# Patient Record
Sex: Female | Born: 1985
Health system: Southern US, Community
[De-identification: ages and names within clinical notes are randomized; demographics above are authoritative.]

## PROBLEM LIST (undated history)

## (undated) DIAGNOSIS — N3281 Overactive bladder: Secondary | ICD-10-CM

## (undated) DIAGNOSIS — K5900 Constipation, unspecified: Secondary | ICD-10-CM

## (undated) DIAGNOSIS — K829 Disease of gallbladder, unspecified: Secondary | ICD-10-CM

## (undated) DIAGNOSIS — F329 Major depressive disorder, single episode, unspecified: Secondary | ICD-10-CM

## (undated) DIAGNOSIS — G43909 Migraine, unspecified, not intractable, without status migrainosus: Secondary | ICD-10-CM

## (undated) DIAGNOSIS — R102 Pelvic and perineal pain: Secondary | ICD-10-CM

## (undated) DIAGNOSIS — E739 Lactose intolerance, unspecified: Secondary | ICD-10-CM

## (undated) DIAGNOSIS — R32 Unspecified urinary incontinence: Secondary | ICD-10-CM

## (undated) DIAGNOSIS — F419 Anxiety disorder, unspecified: Secondary | ICD-10-CM

## (undated) DIAGNOSIS — K219 Gastro-esophageal reflux disease without esophagitis: Secondary | ICD-10-CM

## (undated) DIAGNOSIS — T7840XA Allergy, unspecified, initial encounter: Secondary | ICD-10-CM

## (undated) DIAGNOSIS — K589 Irritable bowel syndrome without diarrhea: Secondary | ICD-10-CM

## (undated) DIAGNOSIS — E282 Polycystic ovarian syndrome: Secondary | ICD-10-CM

## (undated) DIAGNOSIS — R002 Palpitations: Secondary | ICD-10-CM

## (undated) DIAGNOSIS — M549 Dorsalgia, unspecified: Secondary | ICD-10-CM

## (undated) DIAGNOSIS — R0600 Dyspnea, unspecified: Secondary | ICD-10-CM

## (undated) DIAGNOSIS — N3010 Interstitial cystitis (chronic) without hematuria: Secondary | ICD-10-CM

## (undated) DIAGNOSIS — F32A Depression, unspecified: Secondary | ICD-10-CM

## (undated) DIAGNOSIS — G932 Benign intracranial hypertension: Secondary | ICD-10-CM

## (undated) DIAGNOSIS — N393 Stress incontinence (female) (male): Secondary | ICD-10-CM

## (undated) HISTORY — DX: Benign intracranial hypertension: G93.2

## (undated) HISTORY — DX: Polycystic ovarian syndrome: E28.2

## (undated) HISTORY — DX: Unspecified urinary incontinence: R32

## (undated) HISTORY — DX: Interstitial cystitis (chronic) without hematuria: N30.10

## (undated) HISTORY — DX: Dyspnea, unspecified: R06.00

## (undated) HISTORY — DX: Disease of gallbladder, unspecified: K82.9

## (undated) HISTORY — DX: Palpitations: R00.2

## (undated) HISTORY — DX: Allergy, unspecified, initial encounter: T78.40XA

## (undated) HISTORY — DX: Anxiety disorder, unspecified: F41.9

## (undated) HISTORY — DX: Lactose intolerance, unspecified: E73.9

## (undated) HISTORY — DX: Irritable bowel syndrome, unspecified: K58.9

## (undated) HISTORY — DX: Dorsalgia, unspecified: M54.9

## (undated) HISTORY — PX: TUBAL LIGATION: SHX77

## (undated) HISTORY — DX: Constipation, unspecified: K59.00

## (undated) HISTORY — DX: Migraine, unspecified, not intractable, without status migrainosus: G43.909

---

## 2006-07-14 ENCOUNTER — Emergency Department (HOSPITAL_COMMUNITY): Admission: EM | Admit: 2006-07-14 | Discharge: 2006-07-14 | Payer: Self-pay | Admitting: Emergency Medicine

## 2006-07-29 ENCOUNTER — Emergency Department (HOSPITAL_COMMUNITY): Admission: EM | Admit: 2006-07-29 | Discharge: 2006-07-29 | Payer: Self-pay | Admitting: Family Medicine

## 2007-04-26 ENCOUNTER — Inpatient Hospital Stay (HOSPITAL_COMMUNITY): Admission: AD | Admit: 2007-04-26 | Discharge: 2007-04-26 | Payer: Self-pay | Admitting: Obstetrics and Gynecology

## 2007-09-01 ENCOUNTER — Inpatient Hospital Stay (HOSPITAL_COMMUNITY): Admission: RE | Admit: 2007-09-01 | Discharge: 2007-09-04 | Payer: Self-pay | Admitting: Obstetrics and Gynecology

## 2007-09-09 ENCOUNTER — Inpatient Hospital Stay (HOSPITAL_COMMUNITY): Admission: AD | Admit: 2007-09-09 | Discharge: 2007-09-09 | Payer: Self-pay | Admitting: Obstetrics and Gynecology

## 2007-09-11 ENCOUNTER — Inpatient Hospital Stay (HOSPITAL_COMMUNITY): Admission: AD | Admit: 2007-09-11 | Discharge: 2007-09-11 | Payer: Self-pay | Admitting: Obstetrics and Gynecology

## 2007-11-01 ENCOUNTER — Encounter: Admission: RE | Admit: 2007-11-01 | Discharge: 2007-11-01 | Payer: Self-pay | Admitting: Neurology

## 2008-01-25 ENCOUNTER — Ambulatory Visit (HOSPITAL_COMMUNITY): Admission: RE | Admit: 2008-01-25 | Discharge: 2008-01-25 | Payer: Self-pay | Admitting: Family Medicine

## 2008-01-27 HISTORY — PX: CHOLECYSTECTOMY: SHX55

## 2008-02-12 ENCOUNTER — Emergency Department (HOSPITAL_COMMUNITY): Admission: EM | Admit: 2008-02-12 | Discharge: 2008-02-12 | Payer: Self-pay | Admitting: Family Medicine

## 2008-03-07 ENCOUNTER — Encounter (INDEPENDENT_AMBULATORY_CARE_PROVIDER_SITE_OTHER): Payer: Self-pay | Admitting: General Surgery

## 2008-03-07 ENCOUNTER — Ambulatory Visit (HOSPITAL_COMMUNITY): Admission: RE | Admit: 2008-03-07 | Discharge: 2008-03-07 | Payer: Self-pay | Admitting: General Surgery

## 2008-03-18 ENCOUNTER — Emergency Department (HOSPITAL_COMMUNITY): Admission: EM | Admit: 2008-03-18 | Discharge: 2008-03-18 | Payer: Self-pay | Admitting: Emergency Medicine

## 2008-08-13 ENCOUNTER — Ambulatory Visit (HOSPITAL_COMMUNITY): Admission: RE | Admit: 2008-08-13 | Discharge: 2008-08-13 | Payer: Self-pay | Admitting: Family Medicine

## 2008-08-24 ENCOUNTER — Ambulatory Visit (HOSPITAL_COMMUNITY): Admission: RE | Admit: 2008-08-24 | Discharge: 2008-08-24 | Payer: Self-pay | Admitting: Family Medicine

## 2008-09-11 ENCOUNTER — Encounter: Admission: RE | Admit: 2008-09-11 | Discharge: 2008-10-10 | Payer: Self-pay | Admitting: Family Medicine

## 2008-12-02 ENCOUNTER — Emergency Department (HOSPITAL_COMMUNITY): Admission: EM | Admit: 2008-12-02 | Discharge: 2008-12-02 | Payer: Self-pay | Admitting: Family Medicine

## 2009-01-04 ENCOUNTER — Emergency Department (HOSPITAL_COMMUNITY): Admission: EM | Admit: 2009-01-04 | Discharge: 2009-01-04 | Payer: Self-pay | Admitting: Family Medicine

## 2009-03-17 ENCOUNTER — Emergency Department (HOSPITAL_COMMUNITY): Admission: EM | Admit: 2009-03-17 | Discharge: 2009-03-17 | Payer: Self-pay | Admitting: Emergency Medicine

## 2009-05-25 ENCOUNTER — Emergency Department (HOSPITAL_COMMUNITY): Admission: EM | Admit: 2009-05-25 | Discharge: 2009-05-26 | Payer: Self-pay | Admitting: Emergency Medicine

## 2009-06-10 DIAGNOSIS — R55 Syncope and collapse: Secondary | ICD-10-CM | POA: Insufficient documentation

## 2009-06-11 DIAGNOSIS — R0989 Other specified symptoms and signs involving the circulatory and respiratory systems: Secondary | ICD-10-CM | POA: Insufficient documentation

## 2009-06-11 DIAGNOSIS — R0609 Other forms of dyspnea: Secondary | ICD-10-CM | POA: Insufficient documentation

## 2009-06-11 DIAGNOSIS — R002 Palpitations: Secondary | ICD-10-CM | POA: Insufficient documentation

## 2009-06-13 ENCOUNTER — Ambulatory Visit: Payer: Self-pay | Admitting: Cardiology

## 2009-06-13 DIAGNOSIS — I517 Cardiomegaly: Secondary | ICD-10-CM | POA: Insufficient documentation

## 2009-06-26 HISTORY — PX: TRANSTHORACIC ECHOCARDIOGRAM: SHX275

## 2009-07-03 ENCOUNTER — Ambulatory Visit: Payer: Self-pay

## 2009-07-03 ENCOUNTER — Ambulatory Visit: Payer: Self-pay | Admitting: Cardiovascular Disease

## 2009-07-03 ENCOUNTER — Encounter: Payer: Self-pay | Admitting: Cardiology

## 2009-07-03 ENCOUNTER — Ambulatory Visit (HOSPITAL_COMMUNITY): Admission: RE | Admit: 2009-07-03 | Discharge: 2009-07-03 | Payer: Self-pay | Admitting: Cardiology

## 2009-07-10 ENCOUNTER — Encounter: Admission: RE | Admit: 2009-07-10 | Discharge: 2009-07-10 | Payer: Self-pay | Admitting: Obstetrics and Gynecology

## 2009-08-18 ENCOUNTER — Ambulatory Visit: Payer: Self-pay | Admitting: Nurse Practitioner

## 2009-08-18 ENCOUNTER — Inpatient Hospital Stay (HOSPITAL_COMMUNITY): Admission: AD | Admit: 2009-08-18 | Discharge: 2009-08-18 | Payer: Self-pay | Admitting: Obstetrics and Gynecology

## 2009-08-18 DIAGNOSIS — R32 Unspecified urinary incontinence: Secondary | ICD-10-CM

## 2009-08-18 DIAGNOSIS — O9989 Other specified diseases and conditions complicating pregnancy, childbirth and the puerperium: Secondary | ICD-10-CM

## 2009-08-18 DIAGNOSIS — O99891 Other specified diseases and conditions complicating pregnancy: Secondary | ICD-10-CM

## 2009-09-13 ENCOUNTER — Encounter (INDEPENDENT_AMBULATORY_CARE_PROVIDER_SITE_OTHER): Payer: Self-pay | Admitting: Obstetrics and Gynecology

## 2009-09-13 ENCOUNTER — Inpatient Hospital Stay (HOSPITAL_COMMUNITY): Admission: AD | Admit: 2009-09-13 | Discharge: 2009-09-15 | Payer: Self-pay | Admitting: Obstetrics and Gynecology

## 2010-02-26 NOTE — Assessment & Plan Note (Signed)
Summary: np6/palps/sob/[redacted] weeks pregnant   Visit Type:  new pt visit Referring Provider:  Dr. Cherly Hensen Primary Provider:  Elvera Lennox PA [facility]  CC:  pt has had 2 syncope episodes, 1st episode pt states she was dehydrated, and no reason found for the 2nd episode....sob at times...denies any cp or edema.  History of Present Illness: [name] is referred today by Dr. Cherly Hensen for evaluation of 2 episodes of syncope.  She is currently [redacted] weeks pregnant. She's had 2 episodes of syncope at work. 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  Once, she had been sick and had had poor p.o. intake. She was not febrile. She was standing taking report and became lightheaded, vision blackened, she went down. She came to quickly and was clear mentally. No seizure activity was noted.  A second occasion occurred on 30 April. She was standing once again and became lightheaded short of breath, vision blocking, and she went out.  She was evaluated in the emergency room which I reviewed today. A chest x-ray showed a question of cardiomegaly a calm him a CT angios showed no pulmonary embolus with the heart being upper limits of normal in size. No other abnormality.  She's had no syncope with exertion. She denies any chest pain or significant dyspnea on exertion. She has had no orthopnea or PND. She denies significant edema.  She denies problems with her first pregnancy.  Current Medications (verified): 1)  Prenatal Vitamins 0.8 Mg Tabs (Prenatal Multivit-Min-Fe-Fa) .Marland Kitchen.. 1 Tab Once Daily 2)  Zyrtec Allergy 10 Mg Tabs (Cetirizine Hcl) .Marland Kitchen.. 1 Tab Once Daily  Allergies (verified): No Known Drug Allergies  Review of Systems       negative history of present illness  Vital Signs:  Patient profile:   25 year old female Height:      65 inches Weight:      268 pounds BMI:     44.76 Pulse rate:   95 / minute Pulse (ortho):   102 / minute Pulse rhythm:   regular BP sitting:   99 / 65  (left arm) BP standing:   92  / 65 Cuff size:   large  Vitals Entered By: Danielle Rankin, CMA (Jun 13, 2009 11:39 AM)  Serial Vital Signs/Assessments:  Time      Position  BP       Pulse  Resp  Temp     By 11:42 AM  Lying LA  99/65    102                   Danielle Rankin, CMA 11:43 AM  Sitting   98/68    108                   Danielle Rankin, CMA 11:44 AM  Standing  92/65    102                   Danielle Rankin, [place] 11:45 AM  Standing  91/69    117                   Danielle Rankin, CMA 11:47 AM  Standing  95/69    107                   Danielle Rankin, CMA  Comments: 11:42 AM no sxms By: Danielle Rankin, CMA  11:43 AM no sxms By: Danielle Rankin, CMA  11:44 AM no sxms By: Danielle Rankin, CMA  11:45 AM no sxms By: Danielle Rankin, CMA  11:47 AM no sxms By: Danielle Rankin, CMA    Physical Exam  General:  obese.  obese.   Head:  normocephalic and atraumatic Eyes:  PERRLA/EOM intact; conjunctiva and lids normal. Ears:  TM's intact and clear with normal canals and hearing Neck:  Neck supple, no JVD. No masses, thyromegaly or abnormal cervical nodes. Chest Tiron Suski:  no deformities or breast masses noted Lungs:  Clear bilaterally to auscultation and percussion. Heart:  PMI difficult to appreciate, regular rate and rhythm, normal S1-S2, no gallop or murmur. Abdomen:  pregnant, obese Msk:  Back normal, normal gait. Muscle strength and tone normal. Pulses:  pulses normal in all 4 extremities Extremities:  No clubbing or cyanosis. Neurologic:  Alert and oriented x 3. Skin:  Intact without lesions or rashes. Psych:  Normal affect.   EKG  Procedure date:  06/13/2009  Findings:      nsinus rhythm, normal EKG  EKG  Procedure date:  06/13/2009  Findings:      normal sinus rhythm, normal EKG  Impression & Recommendations:  Problem # 1:  SYNCOPE (ICD-780.2) Assessment New  Both episodes of syncope sound orthostatic. One was with dehydration and poor p.o. intake, otherwise was when she was standing too long taking report. She is  clearly orthostatic in the office. If anything her blood pressures borderline low even at rest.  I reviewed orthostatic precautions, aspirate liver last her sodium and her diet, and avoid standing in one position too long. We'll check a 2-D echocardiogram to rule out structural heart disease.  Orders: Echocardiogram (Echo)  Problem # 2:  CARDIOMEGALY (ICD-429.3) Assessment: New Check echocardiogram to rule out cardiac enlargement or cardiomyopathy. We'll also assess right-sided pressures.  Other Orders: EKG w/ Interpretation (93000)  Patient Instructions: 1)  Your physician recommends that you schedule a follow-up appointment in: AS NEEDED 2)  Your physician recommends that you continue on your current medications as directed. Please refer to the Current Medication list given to you today. 3)  Your physician has requested that you have an echocardiogram.  Echocardiography is a painless test that uses sound waves to create images of your heart. It provides your doctor with information about the size and shape of your heart and how well your heart's chambers and valves are working.  This procedure takes approximately one hour. There are no restrictions for this procedure. 4)  INCREASE SODIUM IN DIET 5)  ORTHOSTATIC PRECAUTIONS  6)  NO STANDING IN 1 POSITION FOR LONG PERIOD 7)  AND MAKE SLOW MOVEMENTS IN POSITIONAL CHANGES

## 2010-04-11 LAB — CBC
HCT: 33.7 % — ABNORMAL LOW (ref 36.0–46.0)
HCT: 36.9 % (ref 36.0–46.0)
Hemoglobin: 11.5 g/dL — ABNORMAL LOW (ref 12.0–15.0)
Hemoglobin: 12.4 g/dL (ref 12.0–15.0)
MCH: 33.3 pg (ref 26.0–34.0)
MCH: 33.8 pg (ref 26.0–34.0)
MCHC: 33.7 g/dL (ref 30.0–36.0)
MCHC: 34.2 g/dL (ref 30.0–36.0)
MCV: 98.8 fL (ref 78.0–100.0)
MCV: 99 fL (ref 78.0–100.0)
Platelets: 142 10*3/uL — ABNORMAL LOW (ref 150–400)
Platelets: 190 10*3/uL (ref 150–400)
RBC: 3.41 MIL/uL — ABNORMAL LOW (ref 3.87–5.11)
RBC: 3.73 MIL/uL — ABNORMAL LOW (ref 3.87–5.11)
RDW: 13.9 % (ref 11.5–15.5)
RDW: 14.1 % (ref 11.5–15.5)
WBC: 8.5 10*3/uL (ref 4.0–10.5)
WBC: 8.9 10*3/uL (ref 4.0–10.5)

## 2010-04-11 LAB — BASIC METABOLIC PANEL
BUN: 4 mg/dL — ABNORMAL LOW (ref 6–23)
CO2: 25 mEq/L (ref 19–32)
Calcium: 9.5 mg/dL (ref 8.4–10.5)
Chloride: 106 mEq/L (ref 96–112)
Creatinine, Ser: 0.53 mg/dL (ref 0.4–1.2)
GFR calc Af Amer: >60 mL/min (ref >60)
GFR calc non Af Amer: >60 mL/min (ref >60)
Glucose, Bld: 82 mg/dL (ref 70–99)
Potassium: 3.3 mEq/L — ABNORMAL LOW (ref 3.5–5.1)
Sodium: 136 mEq/L (ref 135–145)

## 2010-04-11 LAB — GLUCOSE, CAPILLARY
Glucose-Capillary: 98 mg/dL (ref 70–99)
Glucose-Capillary: 99 mg/dL (ref 70–99)

## 2010-04-11 LAB — SURGICAL PCR SCREEN
MRSA, PCR: NEGATIVE
Staphylococcus aureus: POSITIVE — AB

## 2010-04-11 LAB — RPR: RPR Ser Ql: NONREACTIVE

## 2010-04-11 LAB — CCBB MATERNAL DONOR DRAW

## 2010-04-15 LAB — POCT I-STAT, CHEM 8
BUN: 6 mg/dL (ref 6–23)
Calcium, Ion: 1.13 mmol/L (ref 1.12–1.32)
Chloride: 107 mEq/L (ref 96–112)
Creatinine, Ser: 0.5 mg/dL (ref 0.4–1.2)
Glucose, Bld: 141 mg/dL — ABNORMAL HIGH (ref 70–99)
HCT: 37 % (ref 36.0–46.0)
Hemoglobin: 12.6 g/dL (ref 12.0–15.0)
Potassium: 3.2 mEq/L — ABNORMAL LOW (ref 3.5–5.1)
Sodium: 139 mEq/L (ref 135–145)
TCO2: 19 mmol/L (ref 0–100)

## 2010-04-16 LAB — URINALYSIS, ROUTINE W REFLEX MICROSCOPIC
Bilirubin Urine: NEGATIVE
Glucose, UA: NEGATIVE mg/dL
Ketones, ur: NEGATIVE mg/dL
Leukocytes, UA: NEGATIVE
Nitrite: NEGATIVE
Protein, ur: NEGATIVE mg/dL
Specific Gravity, Urine: 1.004 — ABNORMAL LOW (ref 1.005–1.030)
Urobilinogen, UA: 0.2 mg/dL (ref 0.0–1.0)
pH: 6 (ref 5.0–8.0)

## 2010-04-16 LAB — POCT I-STAT, CHEM 8
BUN: 3 mg/dL — ABNORMAL LOW (ref 6–23)
Calcium, Ion: 1.09 mmol/L — ABNORMAL LOW (ref 1.12–1.32)
Chloride: 107 mEq/L (ref 96–112)
Creatinine, Ser: 0.6 mg/dL (ref 0.4–1.2)
Glucose, Bld: 97 mg/dL (ref 70–99)
HCT: 39 % (ref 36.0–46.0)
Hemoglobin: 13.3 g/dL (ref 12.0–15.0)
Potassium: 3.2 mEq/L — ABNORMAL LOW (ref 3.5–5.1)
Sodium: 139 mEq/L (ref 135–145)
TCO2: 19 mmol/L (ref 0–100)

## 2010-04-16 LAB — URINE MICROSCOPIC-ADD ON

## 2010-04-29 LAB — POCT URINALYSIS DIP (DEVICE)
Bilirubin Urine: NEGATIVE
Glucose, UA: NEGATIVE mg/dL
Ketones, ur: NEGATIVE mg/dL
Nitrite: NEGATIVE
Protein, ur: 30 mg/dL — AB
Specific Gravity, Urine: 1.02 (ref 1.005–1.030)
Urobilinogen, UA: 0.2 mg/dL (ref 0.0–1.0)
pH: 6 (ref 5.0–8.0)

## 2010-04-29 LAB — POCT PREGNANCY, URINE: Preg Test, Ur: NEGATIVE

## 2010-04-30 LAB — POCT PREGNANCY, URINE: Preg Test, Ur: NEGATIVE

## 2010-05-12 LAB — POCT URINALYSIS DIP (DEVICE)
Bilirubin Urine: NEGATIVE
Glucose, UA: NEGATIVE mg/dL
Ketones, ur: NEGATIVE mg/dL
Nitrite: NEGATIVE
Protein, ur: 30 mg/dL — AB
Specific Gravity, Urine: 1.025 (ref 1.005–1.030)
Urobilinogen, UA: 0.2 mg/dL (ref 0.0–1.0)
pH: 5.5 (ref 5.0–8.0)

## 2010-05-12 LAB — DIFFERENTIAL
Basophils Absolute: 0 10*3/uL (ref 0.0–0.1)
Basophils Relative: 1 % (ref 0–1)
Eosinophils Absolute: 0.1 10*3/uL (ref 0.0–0.7)
Eosinophils Relative: 2 % (ref 0–5)
Lymphocytes Relative: 31 % (ref 12–46)
Lymphs Abs: 2.4 10*3/uL (ref 0.7–4.0)
Monocytes Absolute: 0.4 10*3/uL (ref 0.1–1.0)
Monocytes Relative: 6 % (ref 3–12)
Neutro Abs: 4.9 10*3/uL (ref 1.7–7.7)
Neutrophils Relative %: 62 % (ref 43–77)

## 2010-05-12 LAB — POCT I-STAT, CHEM 8
BUN: 11 mg/dL (ref 6–23)
Calcium, Ion: 1.2 mmol/L (ref 1.12–1.32)
Chloride: 102 mEq/L (ref 96–112)
Creatinine, Ser: 0.9 mg/dL (ref 0.4–1.2)
Glucose, Bld: 86 mg/dL (ref 70–99)
HCT: 43 % (ref 36.0–46.0)
Hemoglobin: 14.6 g/dL (ref 12.0–15.0)
Potassium: 3.3 mEq/L — ABNORMAL LOW (ref 3.5–5.1)
Sodium: 141 mEq/L (ref 135–145)
TCO2: 27 mmol/L (ref 0–100)

## 2010-05-12 LAB — CBC
HCT: 39.4 % (ref 36.0–46.0)
Hemoglobin: 12.9 g/dL (ref 12.0–15.0)
MCHC: 32.8 g/dL (ref 30.0–36.0)
MCV: 91.4 fL (ref 78.0–100.0)
Platelets: 307 10*3/uL (ref 150–400)
RBC: 4.31 MIL/uL (ref 3.87–5.11)
RDW: 13.1 % (ref 11.5–15.5)
WBC: 8 10*3/uL (ref 4.0–10.5)

## 2010-05-12 LAB — POCT PREGNANCY, URINE: Preg Test, Ur: NEGATIVE

## 2010-05-13 LAB — DIFFERENTIAL
Band Neutrophils: 0 % (ref 0–10)
Basophils Absolute: 0.1 10*3/uL (ref 0.0–0.1)
Basophils Absolute: 0.1 10*3/uL (ref 0.0–0.1)
Basophils Relative: 1 % (ref 0–1)
Basophils Relative: 1 % (ref 0–1)
Blasts: 0 %
Eosinophils Absolute: 0.1 10*3/uL (ref 0.0–0.7)
Eosinophils Absolute: 0.2 10*3/uL (ref 0.0–0.7)
Eosinophils Relative: 1 % (ref 0–5)
Eosinophils Relative: 3 % (ref 0–5)
Lymphocytes Relative: 24 % (ref 12–46)
Lymphocytes Relative: 20 % (ref 12–46)
Lymphs Abs: 2.3 10*3/uL (ref 0.7–4.0)
Lymphs Abs: 1.8 10*3/uL (ref 0.7–4.0)
Metamyelocytes Relative: 0 %
Monocytes Absolute: 0.6 10*3/uL (ref 0.1–1.0)
Monocytes Absolute: 0.3 10*3/uL (ref 0.1–1.0)
Monocytes Relative: 6 % (ref 3–12)
Monocytes Relative: 4 % (ref 3–12)
Myelocytes: 0 %
Neutro Abs: 6.3 10*3/uL (ref 1.7–7.7)
Neutro Abs: 6.5 10*3/uL (ref 1.7–7.7)
Neutrophils Relative %: 68 % (ref 43–77)
Neutrophils Relative %: 73 % (ref 43–77)
Promyelocytes Absolute: 0 %
nRBC: 0 /100 WBC

## 2010-05-13 LAB — URINALYSIS, ROUTINE W REFLEX MICROSCOPIC
Bilirubin Urine: NEGATIVE
Glucose, UA: NEGATIVE mg/dL
Hgb urine dipstick: NEGATIVE
Ketones, ur: NEGATIVE mg/dL
Nitrite: NEGATIVE
Protein, ur: NEGATIVE mg/dL
Specific Gravity, Urine: 1.016 (ref 1.005–1.030)
Urobilinogen, UA: 0.2 mg/dL (ref 0.0–1.0)
pH: 6 (ref 5.0–8.0)

## 2010-05-13 LAB — POCT I-STAT, CHEM 8
BUN: 7 mg/dL (ref 6–23)
Calcium, Ion: 1.16 mmol/L (ref 1.12–1.32)
Chloride: 103 mEq/L (ref 96–112)
Creatinine, Ser: 0.7 mg/dL (ref 0.4–1.2)
Glucose, Bld: 91 mg/dL (ref 70–99)
HCT: 45 % (ref 36.0–46.0)
Hemoglobin: 15.3 g/dL — ABNORMAL HIGH (ref 12.0–15.0)
Potassium: 3.8 mEq/L (ref 3.5–5.1)
Sodium: 140 mEq/L (ref 135–145)
TCO2: 26 mmol/L (ref 0–100)

## 2010-05-13 LAB — PREGNANCY, URINE: Preg Test, Ur: NEGATIVE

## 2010-05-13 LAB — CBC
HCT: 39.4 % (ref 36.0–46.0)
HCT: 41.5 % (ref 36.0–46.0)
Hemoglobin: 13.3 g/dL (ref 12.0–15.0)
Hemoglobin: 14.2 g/dL (ref 12.0–15.0)
MCHC: 33.7 g/dL (ref 30.0–36.0)
MCHC: 34.3 g/dL (ref 30.0–36.0)
MCV: 91.2 fL (ref 78.0–100.0)
MCV: 90.7 fL (ref 78.0–100.0)
Platelets: 314 10*3/uL (ref 150–400)
Platelets: 343 10*3/uL (ref 150–400)
RBC: 4.32 MIL/uL (ref 3.87–5.11)
RBC: 4.58 MIL/uL (ref 3.87–5.11)
RDW: 13.8 % (ref 11.5–15.5)
RDW: 13.9 % (ref 11.5–15.5)
WBC: 9.4 10*3/uL (ref 4.0–10.5)
WBC: 9 10*3/uL (ref 4.0–10.5)

## 2010-05-13 LAB — COMPREHENSIVE METABOLIC PANEL
ALT: 18 U/L (ref 0–35)
ALT: 22 U/L (ref 0–35)
AST: 16 U/L (ref 0–37)
AST: 21 U/L (ref 0–37)
Albumin: 3.3 g/dL — ABNORMAL LOW (ref 3.5–5.2)
Albumin: 3.4 g/dL — ABNORMAL LOW (ref 3.5–5.2)
Alkaline Phosphatase: 78 U/L (ref 39–117)
Alkaline Phosphatase: 81 U/L (ref 39–117)
BUN: 9 mg/dL (ref 6–23)
BUN: 6 mg/dL (ref 6–23)
CO2: 25 mEq/L (ref 19–32)
CO2: 25 mEq/L (ref 19–32)
Calcium: 8.8 mg/dL (ref 8.4–10.5)
Calcium: 9 mg/dL (ref 8.4–10.5)
Chloride: 107 mEq/L (ref 96–112)
Chloride: 104 mEq/L (ref 96–112)
Creatinine, Ser: 0.66 mg/dL (ref 0.4–1.2)
Creatinine, Ser: 0.7 mg/dL (ref 0.4–1.2)
GFR calc Af Amer: >60 mL/min (ref >60)
GFR calc Af Amer: >60 mL/min (ref >60)
GFR calc non Af Amer: >60 mL/min (ref >60)
GFR calc non Af Amer: >60 mL/min (ref >60)
Glucose, Bld: 91 mg/dL (ref 70–99)
Glucose, Bld: 92 mg/dL (ref 70–99)
Potassium: 3.9 mEq/L (ref 3.5–5.1)
Potassium: 3.7 mEq/L (ref 3.5–5.1)
Sodium: 138 mEq/L (ref 135–145)
Sodium: 136 mEq/L (ref 135–145)
Total Bilirubin: 0.7 mg/dL (ref 0.3–1.2)
Total Bilirubin: 0.4 mg/dL (ref 0.3–1.2)
Total Protein: 6.5 g/dL (ref 6.0–8.3)
Total Protein: 7.3 g/dL (ref 6.0–8.3)

## 2010-05-13 LAB — LIPASE, BLOOD: Lipase: 20 U/L (ref 11–59)

## 2010-06-10 NOTE — Op Note (Signed)
[name], MANWARREN NO.:  [id number]   MEDICAL RECORD NO.:  [id number]          PATIENT TYPE:  INP   LOCATION:  9199                          [facility]ILITY:  [facility]   PHYSICIAN:  Juluis Mire, M.D.   DATE OF BIRTH:  Jan 13, 1986   DATE OF PROCEDURE:  09/01/2007  DATE OF DISCHARGE:                               OPERATIVE REPORT   PREOPERATIVE DIAGNOSES:  Intrauterine pregnancy at 39 weeks for primary  cesarean section.  The patient with papilledema.   POSTOPERATIVE DIAGNOSES:  Intrauterine pregnancy at 39 weeks for primary  cesarean section.  The patient with papilledema.   PROCEDURES:  Low transverse cesarean section.   SURGEON:  Juluis Mire, MD   ANESTHESIA:  Spinal.   ESTIMATED BLOOD LOSS:  500-600 mL.   PACKS AND DRAINS:  None.   INTRAOPERATIVE BLOOD PLACED:  None.   COMPLICATIONS:  None.   INDICATIONS:  As dictated in history and physical.   PROCEDURE:  The patient was taken to OR, placed in supine position with  left lateral tilt.  After satisfactory level of spinal anesthesia was  obtained, the abdomen was prepped out with Betadine and draped in  sterile field.  A low transverse skin incision was made with a knife and  carried through the subcutaneous tissue.  The fascia was identified and  entered sharply and incision then fashioned laterally.  Rectus muscles  were separated in midline.  Perineum was entered sharply.  Incision of  perineum extended both superiorly and inferiorly.  A low transverse  bladder flap was developed.  A low transverse uterine incision was begun  with a knife and extended laterally using manual traction.  Amniotic  fluid was clear.  The infant was delivered with the aid of the vacuum  extractor and fundal pressure.  There was a viable female weighing 7  pounds 11 ounces, Apgars were 8/9, umbilical artery pH was 7.32.  Placenta was delivered manually.  The uterus exteriorized for closure.  Tubes and ovaries were  unremarkable.  Uterus was closed with running  locking suture of 0 chromic using a 2-layer closure technique.  Uterus  was returned to the abdominal cavity.  We irrigated the pelvis and had  good hemostasis and clear urine output.  Muscles and peritoneum were  closed with a running suture of 3-0 Vicryl.  Fascia was closed with a  running suture of 0 PDS.  Subcu was closed with running suture of 0 plain catgut.  Skin was closed  staples and Steri-Strips.  Sponge, instrument, and needle count was  correct by circulating nurse x2.  Foley catheter remained clear at time  of closure.  The patient did tolerate the procedure well and was  returned to recovery room in good condition.      Juluis Mire, M.D.  Electronically Signed     JSM/MEDQ  D:  09/01/2007  T:  09/01/2007  Job:  [id number]

## 2010-06-10 NOTE — H&P (Signed)
NAMETAYTE, [name] NO.:  [id number]   MEDICAL RECORD NO.:  [id number]         PATIENT TYPE:  INP   LOCATION:                                [facility]   PHYSICIAN:  Juluis Mire, M.D.   DATE OF BIRTH:  Aug 04, 1985   DATE OF ADMISSION:  09/01/2007  DATE OF DISCHARGE:                              HISTORY & PHYSICAL   The patient is a 25 year old gravida 2, para 0, abortus 1 female  estimated date of confinement of September 10, 2007, estimated gestational  age of [redacted] weeks.  She presents for primary cesarean section.   The patient's prenatal course has been complicated by development of  papilledema.  Her neurologist had recommended cesarean section.  He  warned against possible second stage bearing down, so she is admitted at  the present time for primary cesarean section.  Her prenatal course has  otherwise been uncomplicated.  Her group B strep is positive.   ALLERGIES:  No known drug allergies.   MEDICATION:  Prenatal vitamins.   PRENATAL LABS:  She is A positive, negative antibody screen, nonreactive  serology, she is immune to rubella, had a negative hepatitis B surface  antigen, and HIV was nonreactive.  Her 50 g Glucola test was abnormal.  She underwent a 3-hour glucose tolerance test.  However, they came back  within normal range.   For past medical history, family history, and social history, please see  prenatal records.   Review of systems is noncontributory.   PHYSICAL EXAM:  VITAL SIGNS:  The patient is afebrile, stable vital  signs.  HEENT:  The patient is normocephalic.  Pupils are equal, round, and  reactive to light accommodation.  Extraocular movements are intact.  Sclerae and conjunctivae are clear.  Oropharynx is clear.  BREASTS:  Glandular but no discrete masses.  LUNGS:  Clear.  CARDIAC SYSTEM:  Regular rate.  No murmurs or gallops.  ABDOMINAL:  Gravid uterus consistent with date.  PELVIC:  Deferred.  EXTREMITIES:  Trace  edema.  NEUROLOGIC:  Grossly normal limits.  Deep tendon reflexes 2+ no clonus.   IMPRESSION:  1. Intrauterine pregnancy at 39 weeks.  2. The patient with papilledema.   PLAN:  The patient will undergo primary cesarean section.  Risks of  cesarean section have been discussed including the risk of infection.  Risk of hemorrhage that could require transfusion with the risk of AIDS  or hepatitis.  Risk of  injury to adjacent organs including bladder, bowel, ureters could  require further exploratory surgery.  Risk of deep venous thrombosis and  pulmonary embolus.  The patient The patient expressed understanding the  indications and risks.      Juluis Mire, M.D.  Electronically Signed     JSM/MEDQ  D:  09/01/2007  T:  09/01/2007  Job:  [id number]

## 2010-06-10 NOTE — Op Note (Signed)
[name], [name]             ACCOUNT NO.:  [id number]   MEDICAL RECORD NO.:  [id number]          PATIENT TYPE:  AMB   LOCATION:  SDS                          FACILITY:  [facility]   PHYSICIAN:  Juanetta Gosling, MDDATE OF BIRTH:  July 04, 1985   DATE OF PROCEDURE:  03/07/2008  DATE OF DISCHARGE:  03/07/2008                               OPERATIVE REPORT   PREOPERATIVE DIAGNOSIS:  Biliary colic.   POSTOPERATIVE DIAGNOSIS:  Biliary colic, chronic cholecystitis   PROCEDURE:  1. Laparoscopic cholecystectomy with intraoperative cholangiogram.  2. Primary umbilical hernia repair.   SURGEON:  Juanetta Gosling, MD   ASSISTANT:  Anselm Pancoast. Zachery Dakins, MD   ANESTHESIA:  General.   FINDINGS:  Normal cholangiogram and chronic cholecystitis.   SPECIMENS:  Gallbladder and contents to pathology.   ESTIMATED BLOOD LOSS:  Minimal.   COMPLICATIONS:  None.   DRAINS:  None.   DISPOSITION:  To PACU in stable condition.   INDICATIONS:  This is a 25 year old female, works as a Engineer, civil (consulting) in [employer],  with history of multiple attacks of epigastric pain radiating to the  right back and some shoulder pain with some nausea, no vomiting.  She  does IBS, but there has been no change in this.  She underwent an  ultrasound evaluation of cholelithiasis with a gallbladder wall upper  limits of normal thickness.  Common bile duct was normal.  Liver  function tests were normal preoperatively.  On her exam, she was mildly  tender over her right upper quadrant.  We discussed laparoscopic versus  possible open cholecystectomy.   PROCEDURE:  After informed consent was obtained, the patient was  administered 1 g of intravenous cefoxitin.  Sequential compression  devices were placed on her lower extremities prior to induction.  She  was taken to the operating room where she was placed under general  endotracheal anesthesia without complication.  Her abdomen was then  prepped and draped in a standard sterile  surgical fashion with  chlorhexidine.  A surgical time-out was then performed.   A 10-mm vertical incision was then made below her umbilicus and  dissection was carried out down to the level of her umbilicus.  She did  have an umbilical hernia that was difficult to note preoperatively due  to her body habitus.  I entered into her abdomen through her hernia and  then placed a 0 Vicryl purse-string stitch on the fascia around this.  A  Hasson trocar was then introduced and her abdomen was then insufflated  to 15 mmHg of pressure.  Following this, she was placed in the head-up  left-side down position.  Three further 5-mm trocars were placed in the  epigastrium and right upper quadrant under direct vision after  infiltration with local anesthetic without complication.  Her  gallbladder had some adhesions to her omentum that were removed bluntly.  It was then retracted cephalad and lateral.  Dissection was performed  and the critical view of safety was obtained.  The cystic artery and  cystic duct were both clearly identified.  Her cystic duct did look  enlarged, so I elected to  perform a cholangiogram.  A clip was placed  distally on the cystic duct and a ductotomy was made.  The [facility] catheter  was then introduced into the cystic duct.  The clips were then placed to  secure the catheter.  A cholangiogram was then performed which showed  filling of both sides of the liver, filling of the duodenum present,  placement of the catheter in the cystic duct as well as no evidence of  any stones.  Following this, the catheter was removed.  The duct was  then clipped and divided.  The cystic artery was then clipped and  divided as well.  The gallbladder was then removed from the liver bed  with electrocautery.  There was a small leakage of bile during this  portion of the procedure.  The gallbladder was then removed, placed in  an Endocatch bag, and removed through the umbilical incision.  Irrigation  was performed.  Hemostasis was obtained with electrocautery  on the liver bed and was observed.  Irrigation was clear upon  completion.  The camera was then introduced into the epigastrium and we  fixed her umbilical hernia primarily with several 0 Vicryl sutures.  Following this, the trocars were all removed and the abdomen was then  desufflated.  The wounds were all closed with a 4-0 Monocryl in a  subcuticular fashion.  Dermabond was then placed over the wound.  She  tolerated this well, was extubated in the operating room, and  transferred to the recovery room in stable condition.      Juanetta Gosling, MD  Electronically Signed     MCW/MEDQ  D:  03/07/2008  T:  03/08/2008  Job:  [id number]   cc:   Deatra James, M.D.

## 2010-06-11 IMAGING — RF DG CHOLANGIOGRAM OPERATIVE
1 series · 4 of 4 positions shown · non-contrast
Comparison: 01/25/2008 ultrasound

CLINICAL DATA: Patient with cholelithiasis.  Operative
cholangiogram.

INTRAOPERATIVE CHOLANGIOGRAM
TECHNIQUE: Multiple fluoroscopic spot radiographs were obtained
during intraoperative cholangiogram and are submitted for
interpretation post-operatively.

[Series 1: run · 4 of 23 frames shown]
[frame 4/23]
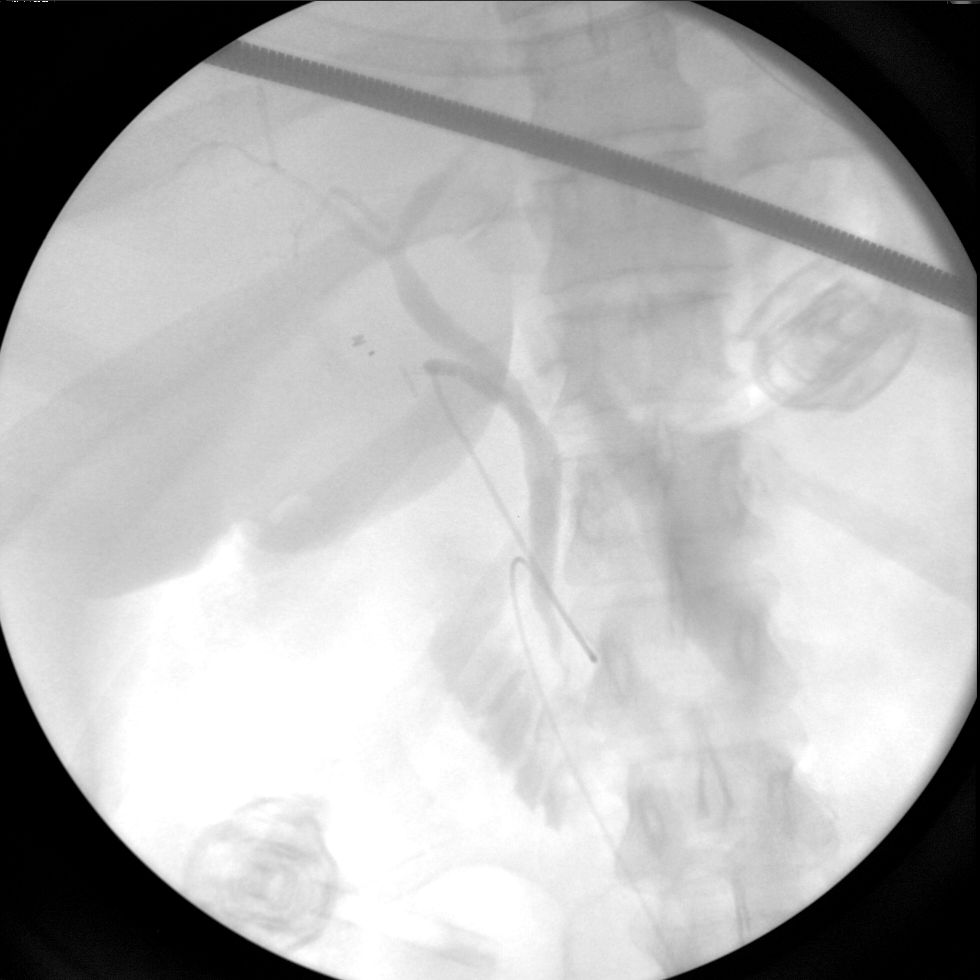
[frame 12/23]
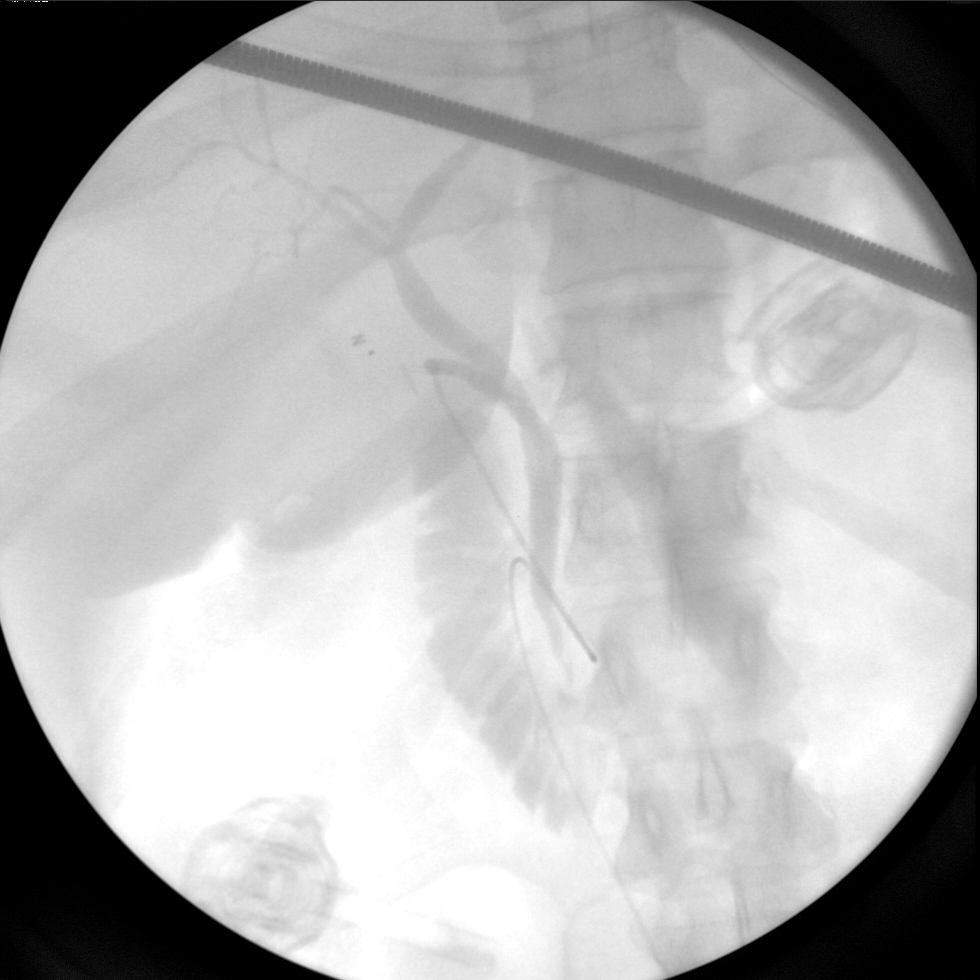
[frame 20/23]
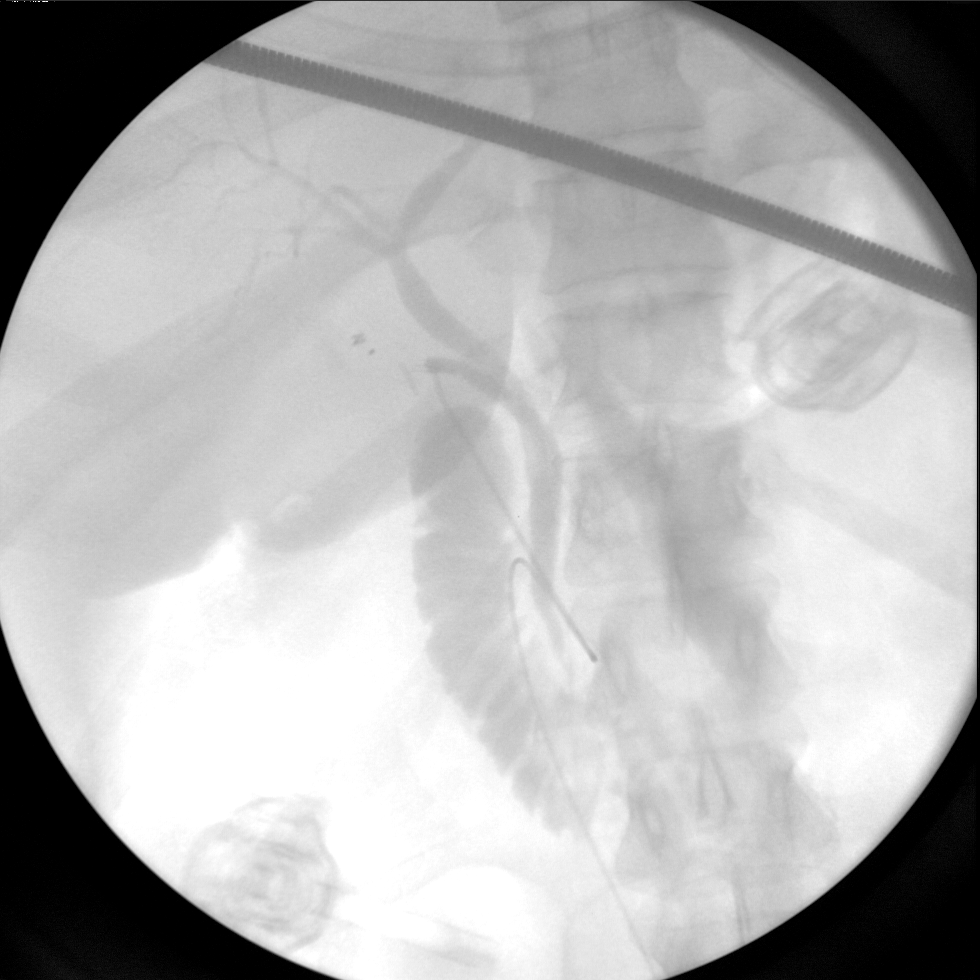
[frame 23/23]
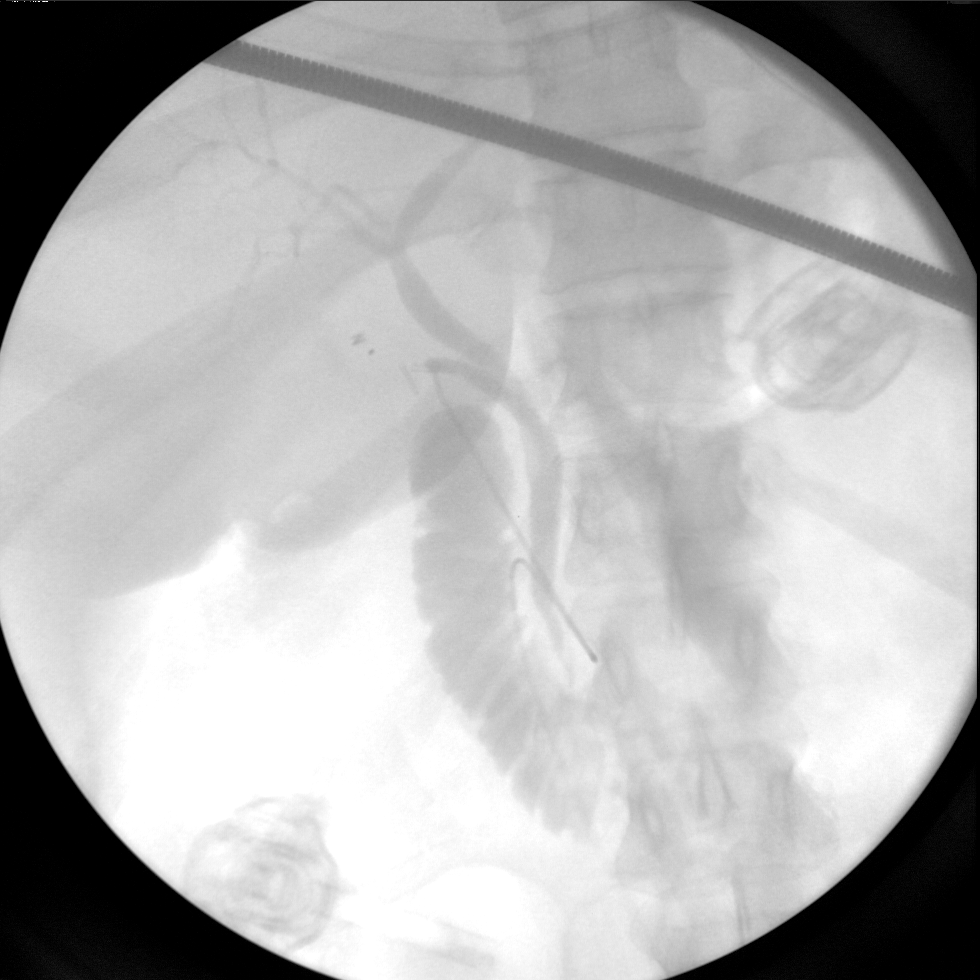

[4 of 4 positions shown; findings below may reference images not displayed]

FINDINGS: This study was performed by Dr. Darden.

Contrast is injected into the cystic duct.
The visualized biliary system is unremarkable.
There is no evidence of biliary dilatation, fixed filling defects,
or fixed areas narrowing within the visualized biliary system.
Contrast flows freely into the duodenum.
IMPRESSION: Unremarkable operative cholangiogram.

REF:G5 DICTATED: 03/07/2008 [DATE]

## 2010-06-13 NOTE — Discharge Summary (Signed)
NAMEONEY, TATLOCK             ACCOUNT NO.:  [id number]   MEDICAL RECORD NO.:  [id number]          PATIENT TYPE:  INP   LOCATION:  9117                          [facility]   PHYSICIAN:  Duke Salvia. Marcelle Overlie, M.D.DATE OF BIRTH:  1985-05-11   DATE OF ADMISSION:  09/01/2007  DATE OF DISCHARGE:  09/04/2007                               DISCHARGE SUMMARY   ADMITTING DIAGNOSES:  1. Intrauterine pregnancy at 75 weeks' estimated gestational age.  2. Papilledema.   DISCHARGE DIAGNOSES:  1. Status post low transverse cesarean section.  2. Viable female infant.   PROCEDURE:  Primary low transverse cesarean section.   REASON FOR ADMISSION:  Please see dictated H&P.   HOSPITAL COURSE:  The patient is 25 year old gravida 2, para 1 that was  admitted to [facility] at 44 weeks' estimated  gestational age for scheduled cesarean section.  The patient's prenatal  course had been complicated by the development of papilledema.  Her  neurologist had recommended the patient to undergo cesarean.  On the  morning of admission, the patient was taken to operating room where  spinal anesthesia was administered without difficulty.  A low transverse  incision was made with delivery of a viable female infant weighing 7  pounds 11 ounces with Apgars of 8 at 1 minute and 9 at 5 minutes.  The  patient tolerated procedure well and was taken to the recovery room in  stable condition.  On postoperative day #1, the patient was without  complaint.  Vital signs were stable.  She was afebrile.  Abdomen soft.  Fundus firm and nontender.  Incision was clean, dry and intact with  scant amount of bleeding noted from the left margin of the incisional  site. Laboratory findings showed hemoglobin of 10.8, platelet count of  168,000 and WBC count of 9.4.  Blood type was noted to be A+.  On  postoperative day #2, the patient was without complaint.  Vital signs  were stable.  She was afebrile.  Fundus firm and  nontender.  Incision  was clean, dry, intact.  On postoperative day #3, the patient was  without complaint.  Vital signs were stable.  Blood pressure 115/78.  Incision was clean, dry and intact.  Staples were removed and the  patient was later discharged home.   CONDITION ON DISCHARGE:  Stable.   DIET:  Regular as tolerated.   ACTIVITY:  No heavy lifting.  No driving x2 weeks. No vaginal entry.   FOLLOWUP:  The patient is to follow up in the office in 1-2 weeks for an  incision check.  She is to call for temperature greater than 100  degrees, persistent nausea, vomiting, heavy vaginal bleeding and/or  redness or drainage from incisional site.   DISCHARGE MEDICATIONS:  Tylox #30 one p.o. every 4-6 hours, Motrin 600  mg every 6 hours, prenatal vitamins 1 p.o. daily, and Colace 1 p.o.  daily p.r.n.      Julio Sicks, N.P.      Richard M. Marcelle Overlie, M.D.  Electronically Signed    CC/MEDQ  D:  09/20/2007  T:  09/20/2007  Job:  [id number]

## 2010-09-30 ENCOUNTER — Inpatient Hospital Stay (INDEPENDENT_AMBULATORY_CARE_PROVIDER_SITE_OTHER)
Admission: RE | Admit: 2010-09-30 | Discharge: 2010-09-30 | Disposition: A | Discharge status: Home / Self Care | Payer: 59 | Source: Ambulatory Visit | Attending: Family Medicine | Admitting: Family Medicine

## 2010-09-30 DIAGNOSIS — J069 Acute upper respiratory infection, unspecified: Secondary | ICD-10-CM

## 2010-10-24 LAB — CBC
HCT: 32.4 — ABNORMAL LOW
HCT: 37.4
HCT: 38.8
Hemoglobin: 10.8 — ABNORMAL LOW
Hemoglobin: 12.5
Hemoglobin: 12.9
MCHC: 33.3
MCHC: 33.4
MCHC: 33.4
MCV: 95
MCV: 96.1
MCV: 96.5
Platelets: 168
Platelets: 230
Platelets: 296
RBC: 3.37 — ABNORMAL LOW
RBC: 3.94
RBC: 4.01
RDW: 13.7
RDW: 14.1
RDW: 14.3
WBC: 11.2 — ABNORMAL HIGH
WBC: 7.9
WBC: 9.4

## 2010-10-24 LAB — COMPREHENSIVE METABOLIC PANEL
ALT: 31
AST: 24
Albumin: 2.8 — ABNORMAL LOW
Alkaline Phosphatase: 86
BUN: 14
CO2: 28
Calcium: 8.6
Chloride: 108
Creatinine, Ser: 1.31 — ABNORMAL HIGH
GFR calc Af Amer: >60
GFR calc non Af Amer: 50 — ABNORMAL LOW
Glucose, Bld: 106 — ABNORMAL HIGH
Potassium: 3.3 — ABNORMAL LOW
Sodium: 144
Total Bilirubin: 0.3
Total Protein: 5.9 — ABNORMAL LOW

## 2010-10-24 LAB — LACTATE DEHYDROGENASE: LDH: 213

## 2010-10-24 LAB — URIC ACID: Uric Acid, Serum: 8.4 — ABNORMAL HIGH

## 2010-10-24 LAB — RPR: RPR Ser Ql: NONREACTIVE

## 2010-11-11 LAB — POCT RAPID STREP A: Streptococcus, Group A Screen (Direct): NEGATIVE

## 2010-11-12 LAB — DIFFERENTIAL
Basophils Absolute: 0.1
Basophils Relative: 1
Eosinophils Absolute: 0.1
Eosinophils Relative: 1
Lymphocytes Relative: 38
Lymphs Abs: 3
Monocytes Absolute: 0.4
Monocytes Relative: 5
Neutro Abs: 4.3
Neutrophils Relative %: 55

## 2010-11-12 LAB — COMPREHENSIVE METABOLIC PANEL
ALT: 17
AST: 15
Albumin: 3.6
Alkaline Phosphatase: 62
BUN: 10
CO2: 25
Calcium: 9.3
Chloride: 106
Creatinine, Ser: 0.7
GFR calc Af Amer: >60
GFR calc non Af Amer: >60
Glucose, Bld: 113 — ABNORMAL HIGH
Potassium: 3.7
Sodium: 138
Total Bilirubin: 0.5
Total Protein: 7

## 2010-11-12 LAB — CBC
HCT: 40.6
Hemoglobin: 13.6
MCHC: 33.6
MCV: 93.8
Platelets: 363
RBC: 4.33
RDW: 13
WBC: 7.9

## 2012-01-10 ENCOUNTER — Emergency Department (HOSPITAL_COMMUNITY): Payer: 59

## 2012-01-10 ENCOUNTER — Emergency Department (HOSPITAL_COMMUNITY)
Admission: EM | Admit: 2012-01-10 | Discharge: 2012-01-10 | Disposition: A | Discharge status: Home / Self Care | Payer: 59 | Attending: Emergency Medicine | Admitting: Emergency Medicine

## 2012-01-10 ENCOUNTER — Encounter (HOSPITAL_COMMUNITY): Payer: Self-pay | Admitting: *Deleted

## 2012-01-10 DIAGNOSIS — F3289 Other specified depressive episodes: Secondary | ICD-10-CM | POA: Insufficient documentation

## 2012-01-10 DIAGNOSIS — R319 Hematuria, unspecified: Secondary | ICD-10-CM

## 2012-01-10 DIAGNOSIS — Z3202 Encounter for pregnancy test, result negative: Secondary | ICD-10-CM | POA: Insufficient documentation

## 2012-01-10 DIAGNOSIS — Z79899 Other long term (current) drug therapy: Secondary | ICD-10-CM | POA: Insufficient documentation

## 2012-01-10 DIAGNOSIS — N318 Other neuromuscular dysfunction of bladder: Secondary | ICD-10-CM | POA: Insufficient documentation

## 2012-01-10 DIAGNOSIS — R109 Unspecified abdominal pain: Secondary | ICD-10-CM | POA: Insufficient documentation

## 2012-01-10 DIAGNOSIS — R300 Dysuria: Secondary | ICD-10-CM | POA: Insufficient documentation

## 2012-01-10 DIAGNOSIS — Z87448 Personal history of other diseases of urinary system: Secondary | ICD-10-CM | POA: Insufficient documentation

## 2012-01-10 DIAGNOSIS — F329 Major depressive disorder, single episode, unspecified: Secondary | ICD-10-CM | POA: Insufficient documentation

## 2012-01-10 DIAGNOSIS — Z87440 Personal history of urinary (tract) infections: Secondary | ICD-10-CM | POA: Insufficient documentation

## 2012-01-10 DIAGNOSIS — R112 Nausea with vomiting, unspecified: Secondary | ICD-10-CM | POA: Insufficient documentation

## 2012-01-10 DIAGNOSIS — R10A0 Flank pain, unspecified side: Secondary | ICD-10-CM

## 2012-01-10 HISTORY — DX: Major depressive disorder, single episode, unspecified: F32.9

## 2012-01-10 HISTORY — DX: Depression, unspecified: F32.A

## 2012-01-10 HISTORY — DX: Overactive bladder: N32.81

## 2012-01-10 LAB — COMPREHENSIVE METABOLIC PANEL
ALT: 12 U/L (ref 0–35)
AST: 17 U/L (ref 0–37)
Albumin: 3.4 g/dL — ABNORMAL LOW (ref 3.5–5.2)
Alkaline Phosphatase: 67 U/L (ref 39–117)
BUN: 14 mg/dL (ref 6–23)
CO2: 21 mEq/L (ref 19–32)
Calcium: 9.5 mg/dL (ref 8.4–10.5)
Chloride: 103 mEq/L (ref 96–112)
Creatinine, Ser: 0.72 mg/dL (ref 0.50–1.10)
GFR calc Af Amer: >90 mL/min (ref >90)
GFR calc non Af Amer: >90 mL/min (ref >90)
Glucose, Bld: 106 mg/dL — ABNORMAL HIGH (ref 70–99)
Potassium: 4.3 mEq/L (ref 3.5–5.1)
Sodium: 137 mEq/L (ref 135–145)
Total Bilirubin: 0.1 mg/dL — ABNORMAL LOW (ref 0.3–1.2)
Total Protein: 7.2 g/dL (ref 6.0–8.3)

## 2012-01-10 LAB — URINALYSIS, MICROSCOPIC ONLY
Bilirubin Urine: NEGATIVE
Glucose, UA: NEGATIVE mg/dL
Ketones, ur: NEGATIVE mg/dL
Leukocytes, UA: NEGATIVE
Nitrite: NEGATIVE
Protein, ur: NEGATIVE mg/dL
Specific Gravity, Urine: 1.022 (ref 1.005–1.030)
Urobilinogen, UA: 1 mg/dL (ref 0.0–1.0)
pH: 6 (ref 5.0–8.0)

## 2012-01-10 LAB — CBC WITH DIFFERENTIAL/PLATELET
Basophils Absolute: 0 10*3/uL (ref 0.0–0.1)
Basophils Relative: 0 % (ref 0–1)
Eosinophils Absolute: 0.2 10*3/uL (ref 0.0–0.7)
Eosinophils Relative: 3 % (ref 0–5)
HCT: 40.6 % (ref 36.0–46.0)
Hemoglobin: 13.6 g/dL (ref 12.0–15.0)
Lymphocytes Relative: 33 % (ref 12–46)
Lymphs Abs: 2.4 10*3/uL (ref 0.7–4.0)
MCH: 30.2 pg (ref 26.0–34.0)
MCHC: 33.5 g/dL (ref 30.0–36.0)
MCV: 90.2 fL (ref 78.0–100.0)
Monocytes Absolute: 0.3 10*3/uL (ref 0.1–1.0)
Monocytes Relative: 4 % (ref 3–12)
Neutro Abs: 4.3 10*3/uL (ref 1.7–7.7)
Neutrophils Relative %: 60 % (ref 43–77)
Platelets: 347 10*3/uL (ref 150–400)
RBC: 4.5 MIL/uL (ref 3.87–5.11)
RDW: 13 % (ref 11.5–15.5)
WBC: 7.3 10*3/uL (ref 4.0–10.5)

## 2012-01-10 LAB — LIPASE, BLOOD: Lipase: 30 U/L (ref 11–59)

## 2012-01-10 LAB — POCT PREGNANCY, URINE: Preg Test, Ur: NEGATIVE

## 2012-01-10 MED ORDER — HYDROMORPHONE HCL PF 1 MG/ML IJ SOLN
1.0000 mg | Freq: Once | INTRAMUSCULAR | Status: AC
  Administered 2012-01-10: 1 mg via INTRAVENOUS
  Filled 2012-01-10: qty 1

## 2012-01-10 MED ORDER — ONDANSETRON HCL 4 MG PO TABS: 4.0000 mg | ORAL_TABLET | Freq: Four times a day (QID) | ORAL | Status: DC

## 2012-01-10 MED ORDER — ONDANSETRON HCL 4 MG/2ML IJ SOLN
INTRAMUSCULAR | Status: AC
  Filled 2012-01-10: qty 2

## 2012-01-10 MED ORDER — SODIUM CHLORIDE 0.9 % IV BOLUS (SEPSIS)
500.0000 mL | Freq: Once | INTRAVENOUS | Status: AC
  Administered 2012-01-10: 500 mL via INTRAVENOUS

## 2012-01-10 MED ORDER — OXYCODONE-ACETAMINOPHEN 5-325 MG PO TABS: 1.0000 | ORAL_TABLET | Freq: Four times a day (QID) | ORAL | Status: DC | PRN

## 2012-01-10 MED ORDER — ONDANSETRON HCL 4 MG/2ML IJ SOLN
4.0000 mg | Freq: Once | INTRAMUSCULAR | Status: AC
  Administered 2012-01-10: 4 mg via INTRAVENOUS
  Filled 2012-01-10: qty 2

## 2012-01-10 MED ORDER — ONDANSETRON HCL 4 MG/2ML IJ SOLN
4.0000 mg | Freq: Once | INTRAMUSCULAR | Status: AC
  Administered 2012-01-10: 4 mg via INTRAVENOUS

## 2012-01-10 NOTE — ED Notes (Signed)
Pt reporting hx of kidney stones. Developed right flank/back pain yesterday. N/v present. Today "feels terrible". Pt is alert and oriented. Pain 5/10. Denying any blood in urine.

## 2012-01-10 NOTE — ED Notes (Signed)
Pt reports right flank pain that started yesterday and increased today, having n/v. Went to [place] clinic and sent here to r/o kidney stone. Hx of frequent kidney infections and uti.

## 2012-01-10 NOTE — ED Provider Notes (Signed)
History     CSN: [id number]  Arrival date & time 01/10/12  1716   First MD Initiated Contact with Patient 01/10/12 1746      Chief Complaint  Patient presents with  . Flank Pain  . Emesis    (Consider location/radiation/quality/duration/timing/severity/associated sxs/prior treatment) Patient is a 26 y.o. female presenting with vomiting. The history is provided by the patient and medical records.  Emesis  Associated symptoms include abdominal pain. Pertinent negatives include no diarrhea, no fever and no headaches.    [name] is a 26 y.o. female  with a hx of frequent UTI and occasional Pyelo presents to the Emergency Department complaining of gradual, persistent, progressively worsening right flank pain onset last week as a dull ache and then progressed to intermittent sharp pain. Associated symptoms include R flank burning, pain, chills, nausea and vomiting, dysuria.  Nothing makes it better and nothing makes it worse. Pt states she is unable to get comfortable.  Pt denies fever, chills, headache, chest pain, shortness of breath, abdominal pain, diarrhea, constipation, frequency, urgency.    Pt was seen at [facility] and had a UA with Hgb only.  Was sent here for r/o kidney stone.      Past Medical History  Diagnosis Date  . UTI (urinary tract infection)   . Kidney infection   . Depression   . Overactive bladder     History reviewed. No pertinent past surgical history.  History reviewed. No pertinent family history.  History  Substance Use Topics  . Smoking status: Not on file  . Smokeless tobacco: Not on file  . Alcohol Use: No    OB History    Grav Para Term Preterm Abortions TAB SAB Ect Mult Living                  Review of Systems  Constitutional: Negative for fever, diaphoresis, appetite change and fatigue.  Respiratory: Negative for shortness of breath.   Cardiovascular: Negative for chest pain.  Gastrointestinal: Positive for  nausea, vomiting and abdominal pain. Negative for diarrhea, constipation and blood in stool.  Genitourinary: Positive for dysuria, hematuria and flank pain. Negative for urgency, frequency and difficulty urinating.  Musculoskeletal: Negative for back pain.  Skin: Negative for rash.  Neurological: Negative for headaches.  All other systems reviewed and are negative.    Allergies  Review of patient's allergies indicates no known allergies.  Home Medications   Current Outpatient Rx  Name  Route  Sig  Dispense  Refill  . AMITRIPTYLINE HCL 10 MG PO TABS   Oral   Take 10 mg by mouth at bedtime.         . DESVENLAFAXINE SUCCINATE ER 100 MG PO TB24   Oral   Take 100 mg by mouth daily.         Marland Kitchen FAMOTIDINE 10 MG PO TABS   Oral   Take 10 mg by mouth 2 (two) times daily.         . TOVIAZ PO   Oral   Take 1 tablet by mouth daily.         Marland Kitchen LORATADINE 10 MG PO TABS   Oral   Take 10 mg by mouth daily.         Marland Kitchen LORAZEPAM 0.5 MG PO TABS   Oral   Take 0.5 mg by mouth every other day.         Marland Kitchen ZOLPIDEM TARTRATE 10 MG PO TABS   Oral  Take 10 mg by mouth every other day. Alternate days with the ativan.         Marland Kitchen ONDANSETRON HCL 4 MG PO TABS   Oral   Take 1 tablet (4 mg total) by mouth every 6 (six) hours.   12 tablet   0   . OXYCODONE-ACETAMINOPHEN 5-325 MG PO TABS   Oral   Take 1-2 tablets by mouth every 6 (six) hours as needed for pain.   12 tablet   0     BP 123/81  Pulse 83  Temp 98.6 F (37 C) (Oral)  Resp 16  SpO2 96%  LMP 01/04/2012  Physical Exam  Nursing note and vitals reviewed. Constitutional: She appears well-developed and well-nourished. No distress.  HENT:  Head: Normocephalic and atraumatic.  Mouth/Throat: Oropharynx is clear and moist. No oropharyngeal exudate.  Eyes: Pupils are equal, round, and reactive to light.  Neck: Normal range of motion.  Cardiovascular: Normal rate, regular rhythm, normal heart sounds and intact distal  pulses.  Exam reveals no gallop and no friction rub.   No murmur heard. Pulmonary/Chest: Effort normal and breath sounds normal. No respiratory distress. She has no wheezes. She has no rales. She exhibits no tenderness.  Abdominal: Soft. Normal appearance and bowel sounds are normal. She exhibits no distension and no mass. There is no tenderness. There is no rebound and no guarding. CVA tenderness: right.       Obese abdomen  Musculoskeletal: Normal range of motion. She exhibits no edema.  Lymphadenopathy:    She has no cervical adenopathy.  Neurological: She is alert. She exhibits normal muscle tone. Coordination normal.  Skin: Skin is warm and dry. No rash noted. She is not diaphoretic.  Psychiatric: She has a normal mood and affect.    ED Course  Procedures (including critical care time)  Labs Reviewed  COMPREHENSIVE METABOLIC PANEL - Abnormal; Notable for the following:    Glucose, Bld 106 (*)     Albumin 3.4 (*)     Total Bilirubin 0.1 (*)     All other components within normal limits  URINALYSIS, MICROSCOPIC ONLY - Abnormal; Notable for the following:    Hgb urine dipstick MODERATE (*)     Squamous Epithelial / LPF FEW (*)     All other components within normal limits  CBC WITH DIFFERENTIAL  LIPASE, BLOOD  POCT PREGNANCY, URINE  URINE CULTURE   Ct Abdomen Pelvis Wo Contrast  01/10/2012  *RADIOLOGY REPORT*  Clinical Data: Right flank pain, nausea/vomiting  CT ABDOMEN AND PELVIS WITHOUT CONTRAST  Technique:  Multidetector CT imaging of the abdomen and pelvis was performed following the standard protocol without intravenous contrast.  Comparison: 03/18/2008  Findings: Lung bases are clear.  Unenhanced liver, spleen, pancreas, and adrenal glands are within normal limits.  Status post cholecystectomy.  No intrahepatic or extrahepatic ductal dilatation.  Kidneys are within normal limits.  No hydronephrosis.  No evidence of bowel obstruction.  Normal appendix.  No evidence of  abdominal aortic aneurysm.  No abdominopelvic ascites.  No suspicious abdominopelvic lymphadenopathy.  Uterus and bilateral ovaries are unremarkable.  No ureteral or bladder calculi.  Mild degenerative changes of the lower thoracic spine.  IMPRESSION: No renal, ureteral, or bladder calculi.  No hydronephrosis.  No evidence of bowel obstruction.  Normal appendix.  No CT findings to account for the patient's right abdominal pain.   Original Report Authenticated By: Charline Bills, M.D.      1. Flank pain   2. Hematuria  MDM  [name] results from her primary care with flank pain.  Most likely secondary to kidney stone vs muscle spasm of the back.  CMP, lipase, CBC all unremarkable.  Pregnancy test negative.  Urinalysis with moderate hemoglobin but no findings concerning for urinary tract infection.  CT scan without evidence of nephrolithiasis, hydronephrosis or other reasons for patient's pain.  We'll discharge home with pain medications, nausea medications and instructions to followup with her primary care physician and urology for further evaluation of the pain and blood in her urine.  1. Medications: Percoset, Zofran, usual home medications 2. Treatment: rest, drink plenty of fluids, medications as prescribed 3. Follow Up: Please followup with your primary doctor for discussion of your diagnoses and further evaluation after today's visit; if you do not have a primary care doctor use the resource guide provided to find one; followup with urology for the blood in your urine  Dierdre Forth, PA-C 01/10/12 1950

## 2012-01-10 NOTE — ED Notes (Signed)
Pt returned from CT, c/o nausea. Pt medicated per PA

## 2012-01-11 LAB — URINE CULTURE: Colony Count: >=100000

## 2012-01-11 NOTE — ED Provider Notes (Signed)
Medical screening examination/treatment/procedure(s) were performed by non-physician practitioner and as supervising physician I was immediately available for consultation/collaboration.  Mikolaj Woolstenhulme R. Arleigh Odowd, MD 01/11/12 0003 

## 2012-02-18 ENCOUNTER — Other Ambulatory Visit: Payer: Self-pay | Admitting: Urology

## 2012-02-26 ENCOUNTER — Encounter (HOSPITAL_BASED_OUTPATIENT_CLINIC_OR_DEPARTMENT_OTHER): Payer: Self-pay | Admitting: *Deleted

## 2012-02-26 NOTE — Progress Notes (Addendum)
To [facility] at 1030-Hg on arrival -Npo after Mn-may take pepcid,pain medication,claritin with small amt water that am -states understands.

## 2012-02-29 NOTE — H&P (Signed)
History of Present Illness   [name] no longer has dysuria. If she had a sling, I was going to recommend a hydrodistension simultaneously. She still leaks without awareness, with urgency, and with stress maneuvers. She wears 3-4 pads a day. She says the pad the first thing in the morning can be soaken.   She is a bit better on Toviaz but it is causing dry mouth. She wants to go back on VESIcare. Samples ____    Past Medical History Problems  1. History of  Anxiety (Symptom) 300.00 2. History of  Asthma 493.90 3. History of  Depression 311 4. History of  Esophageal Reflux 530.81  Surgical History Problems  1. History of  Cesarean Section 2. History of  Cholecystectomy  Current Meds 1. Ativan 1 MG Oral Tablet; Therapy: (Recorded:21Aug2012) to 2. Toviaz 8 MG Oral Tablet Extended Release 24 Hour; TAKE 1 TABLET DAILY; Therapy: 01May2013  to (Evaluate:26Apr2014); Last Rx:01May2013 3. Uribel 118 MG Oral Capsule; TAKE 1 CAPSULE BY MOUTH THREE TIMES DAILY AS NEEDED;  Therapy: 24Aug2012 to (Evaluate:06Jul2013)  Requested for: 07May2013; Last Rx:07May2013 4. VESIcare 10 MG Oral Tablet; a tablet daily; Therapy: 14Feb2013 to (Last Rx:01May2013) 5. ZyrTEC Allergy CAPS; Therapy: (Recorded:05Dec2012) to  Allergies Medication  1. No Known Drug Allergies  Family History Problems  1. Family history of  Family Health Status Number Of Children 1 son 2. Family history of  Hematuria 3. Paternal history of  Hypertension V17.49 4. Family history of  Nephrolithiasis 5. Family history of  Renal Cell Carcinoma V16.51  Social History Problems  1. Activities Of Daily Living 2. Caffeine Use 5-6 3. Exercise Habits Is very busy with work and care of her 29year old. No reg exer at this time. 4. Living Independently With Spouse 5. Marital History - Currently Married 6. Never A Smoker 7. Occupation: Charity fundraiser 8. Self-reliant In Usual Daily Activities Denied  9. History of  Alcohol Use 10. History of   Tobacco Use  Results/Data  Urine [Data Includes: Last 1 Day]   22Jan2014  COLOR YELLOW   APPEARANCE CLEAR   SPECIFIC GRAVITY 1.020   pH 6.0   GLUCOSE NEG mg/dL  BILIRUBIN NEG   KETONE NEG mg/dL  BLOOD MOD   PROTEIN NEG mg/dL  UROBILINOGEN 0.2 mg/dL  NITRITE NEG   LEUKOCYTE ESTERASE NEG   SQUAMOUS EPITHELIAL/HPF FEW   WBC 0-2 WBC/hpf  RBC 3-6 RBC/hpf  BACTERIA MODERATE   CRYSTALS NONE SEEN   CASTS NONE SEEN    Plan   Discussion/Summary   [name]'s presentation is out of the ordinary. She still leaks a small amount with talking and sneezing. She say she is always damp or wet. She is wearing 3-4 pads a day, which can be damp and other times more wet. She said she can have small volume leakage at night but also high volume. Her urge component is more severe than her stress component. She can be sitting and all of a sudden leak.   Because of her past history of discomfort with negative cultures and her presentation out of the ordinary, we talked about a hydrodistension.   We talked about cystoscopy/hydrodistension and instillation in detail. Pros, cons, general surgical and anesthetic risks, and other options including watchful waiting were discussed. Risks were described but not limited to pain, infection, and bleeding. The risk of bladder perforation and management were discussed. The patient understands that it is primarily a diagnostic procedure.   We are going to proceed and see if  she has an underlying interstitial cystitis as the cause of her incontinence. She has not done physical therapy but it has been offered. We will proceed accordingly.   After a thorough review of the management options for the patient's condition the patient  elected to proceed with surgical therapy as noted above. We have discussed the potential benefits and risks of the procedure, side effects of the proposed treatment, the likelihood of the patient achieving the goals of the procedure, and any  potential problems that might occur during the procedure or recuperation. Informed consent has been obtained.

## 2012-03-01 ENCOUNTER — Encounter (HOSPITAL_BASED_OUTPATIENT_CLINIC_OR_DEPARTMENT_OTHER)
Admission: RE | Disposition: A | Discharge status: Home / Self Care | Payer: Self-pay | Source: Ambulatory Visit | Attending: Urology

## 2012-03-01 ENCOUNTER — Ambulatory Visit (HOSPITAL_BASED_OUTPATIENT_CLINIC_OR_DEPARTMENT_OTHER): Payer: 59 | Admitting: Anesthesiology

## 2012-03-01 ENCOUNTER — Encounter (HOSPITAL_BASED_OUTPATIENT_CLINIC_OR_DEPARTMENT_OTHER): Payer: Self-pay | Admitting: *Deleted

## 2012-03-01 ENCOUNTER — Ambulatory Visit (HOSPITAL_BASED_OUTPATIENT_CLINIC_OR_DEPARTMENT_OTHER)
Admission: RE | Admit: 2012-03-01 | Discharge: 2012-03-01 | Disposition: A | Discharge status: Home / Self Care | Payer: 59 | Source: Ambulatory Visit | Attending: Urology | Admitting: Urology

## 2012-03-01 ENCOUNTER — Encounter (HOSPITAL_BASED_OUTPATIENT_CLINIC_OR_DEPARTMENT_OTHER): Payer: Self-pay | Admitting: Anesthesiology

## 2012-03-01 DIAGNOSIS — K219 Gastro-esophageal reflux disease without esophagitis: Secondary | ICD-10-CM | POA: Insufficient documentation

## 2012-03-01 DIAGNOSIS — Z79899 Other long term (current) drug therapy: Secondary | ICD-10-CM | POA: Insufficient documentation

## 2012-03-01 DIAGNOSIS — J45909 Unspecified asthma, uncomplicated: Secondary | ICD-10-CM | POA: Insufficient documentation

## 2012-03-01 DIAGNOSIS — N3946 Mixed incontinence: Secondary | ICD-10-CM | POA: Insufficient documentation

## 2012-03-01 DIAGNOSIS — N3010 Interstitial cystitis (chronic) without hematuria: Secondary | ICD-10-CM | POA: Insufficient documentation

## 2012-03-01 HISTORY — DX: Gastro-esophageal reflux disease without esophagitis: K21.9

## 2012-03-01 HISTORY — DX: Pelvic and perineal pain: R10.2

## 2012-03-01 HISTORY — PX: CYSTO WITH HYDRODISTENSION: SHX5453

## 2012-03-01 LAB — POCT PREGNANCY, URINE: Preg Test, Ur: NEGATIVE

## 2012-03-01 SURGERY — CYSTOSCOPY, WITH BLADDER HYDRODISTENSION
Anesthesia: General | Site: Bladder | Wound class: Clean Contaminated

## 2012-03-01 MED ORDER — HYDROCODONE-ACETAMINOPHEN 5-500 MG PO TABS: 1.0000 | ORAL_TABLET | Freq: Four times a day (QID) | ORAL | Status: DC | PRN

## 2012-03-01 MED ORDER — PROMETHAZINE HCL 25 MG/ML IJ SOLN
6.2500 mg | INTRAMUSCULAR | Status: DC | PRN
  Filled 2012-03-01: qty 1

## 2012-03-01 MED ORDER — PHENAZOPYRIDINE HCL 200 MG PO TABS
ORAL | Status: DC | PRN
  Administered 2012-03-01: 12:00:00 via INTRAVESICAL

## 2012-03-01 MED ORDER — CIPROFLOXACIN IN D5W 400 MG/200ML IV SOLN
400.0000 mg | INTRAVENOUS | Status: AC
  Administered 2012-03-01: 400 mg via INTRAVENOUS
  Filled 2012-03-01: qty 200

## 2012-03-01 MED ORDER — KETOROLAC TROMETHAMINE 30 MG/ML IJ SOLN
INTRAMUSCULAR | Status: DC | PRN
  Administered 2012-03-01: 30 mg via INTRAVENOUS

## 2012-03-01 MED ORDER — LIDOCAINE HCL (CARDIAC) 20 MG/ML IV SOLN
INTRAVENOUS | Status: DC | PRN
  Administered 2012-03-01: 100 mg via INTRAVENOUS

## 2012-03-01 MED ORDER — DEXAMETHASONE SODIUM PHOSPHATE 4 MG/ML IJ SOLN
INTRAMUSCULAR | Status: DC | PRN
  Administered 2012-03-01: 10 mg via INTRAVENOUS

## 2012-03-01 MED ORDER — FENTANYL CITRATE 0.05 MG/ML IJ SOLN
INTRAMUSCULAR | Status: DC | PRN
  Administered 2012-03-01 (×2): 25 ug via INTRAVENOUS

## 2012-03-01 MED ORDER — FENTANYL CITRATE 0.05 MG/ML IJ SOLN
25.0000 ug | INTRAMUSCULAR | Status: DC | PRN
  Filled 2012-03-01: qty 1

## 2012-03-01 MED ORDER — STERILE WATER FOR IRRIGATION IR SOLN
Status: DC | PRN
  Administered 2012-03-01: 1

## 2012-03-01 MED ORDER — MIDAZOLAM HCL 5 MG/5ML IJ SOLN
INTRAMUSCULAR | Status: DC | PRN
  Administered 2012-03-01: 2 mg via INTRAVENOUS

## 2012-03-01 MED ORDER — BUPIVACAINE HCL 0.5 % IJ SOLN
INTRAMUSCULAR | Status: DC | PRN
  Administered 2012-03-01: 15 mL

## 2012-03-01 MED ORDER — CIPROFLOXACIN HCL 250 MG PO TABS: 250.0000 mg | ORAL_TABLET | Freq: Two times a day (BID) | ORAL | Status: DC

## 2012-03-01 MED ORDER — LACTATED RINGERS IV SOLN
INTRAVENOUS | Status: DC
  Administered 2012-03-01: 11:00:00 via INTRAVENOUS
  Filled 2012-03-01: qty 1000

## 2012-03-01 MED ORDER — PROPOFOL 10 MG/ML IV BOLUS
INTRAVENOUS | Status: DC | PRN
  Administered 2012-03-01: 200 mg via INTRAVENOUS

## 2012-03-01 MED ORDER — LACTATED RINGERS IV SOLN
INTRAVENOUS | Status: DC | PRN
  Administered 2012-03-01: 11:00:00 via INTRAVENOUS

## 2012-03-01 MED ORDER — ONDANSETRON HCL 4 MG/2ML IJ SOLN
INTRAMUSCULAR | Status: DC | PRN
  Administered 2012-03-01: 4 mg via INTRAVENOUS

## 2012-03-01 SURGICAL SUPPLY — 20 items
BAG DRAIN URO-CYSTO SKYTR STRL (DRAIN) ×2 IMPLANT
CANISTER SUCT LVC 12 LTR MEDI- (MISCELLANEOUS) IMPLANT
CATH FOLEY 2WAY SLVR  5CC 18FR (CATHETERS)
CATH FOLEY 2WAY SLVR 5CC 18FR (CATHETERS) IMPLANT
CATH ROBINSON RED A/P 12FR (CATHETERS) IMPLANT
CATH ROBINSON RED A/P 14FR (CATHETERS) IMPLANT
CLOTH BEACON ORANGE TIMEOUT ST (SAFETY) ×2 IMPLANT
DRAPE CAMERA CLOSED 9X96 (DRAPES) ×2 IMPLANT
ELECT REM PT RETURN 9FT ADLT (ELECTROSURGICAL)
ELECTRODE REM PT RTRN 9FT ADLT (ELECTROSURGICAL) IMPLANT
GLOVE BIO SURGEON STRL SZ7.5 (GLOVE) ×2 IMPLANT
GLOVE BIOGEL M STER SZ 6 (GLOVE) ×2 IMPLANT
GLOVE ECLIPSE 6.0 STRL STRAW (GLOVE) ×2 IMPLANT
GOWN STRL REIN XL XLG (GOWN DISPOSABLE) ×2 IMPLANT
NDL SAFETY ECLIPSE 18X1.5 (NEEDLE) ×1 IMPLANT
NEEDLE HYPO 18GX1.5 SHARP (NEEDLE) ×1
PACK CYSTOSCOPY (CUSTOM PROCEDURE TRAY) ×2 IMPLANT
SUT SILK 0 TIES 10X30 (SUTURE) IMPLANT
SYR 20CC LL (SYRINGE) ×2 IMPLANT
WATER STERILE IRR 3000ML UROMA (IV SOLUTION) ×2 IMPLANT

## 2012-03-01 NOTE — Anesthesia Procedure Notes (Signed)
Procedure Name: LMA Insertion Date/Time: 03/01/2012 12:08 PM Performed by: Jessica Priest Pre-anesthesia Checklist: Patient identified, Emergency Drugs available, Suction available and Patient being monitored Patient Re-evaluated:Patient Re-evaluated prior to inductionOxygen Delivery Method: Circle System Utilized Preoxygenation: Pre-oxygenation with 100% oxygen Intubation Type: IV induction Ventilation: Mask ventilation without difficulty LMA: LMA inserted LMA Size: 4.0 Number of attempts: 1 Airway Equipment and Method: bite block Placement Confirmation: positive ETCO2 Tube secured with: Tape Dental Injury: Teeth and Oropharynx as per pre-operative assessment

## 2012-03-01 NOTE — Op Note (Signed)
Preoperative diagnosis: Pelvic pain Postoperative diagnosis: Interstitial cystitis Surgery: Cystoscopy bladder hydrodistention and bladder installation therapy Surgeon: Dr. Lorin Picket Corine Solorio  The patient consented the above procedure the above diagnoses. Extra care was taken with leg positioning. Preoperative laboratory tests were normal. Preoperative antibodies were given  Bladder mucosa and trigone were normal with the 23 [place] scope. There is no stitch or foreign body or carcinoma. Bladder was hydrodistended to 700 mL. Bladder was emptied. She had mild glomerulations especially at 5 and 7:00 around the trigone. She no ulcers.  Bladder was emptied.  As a separate procedure I instilled 15 cc of 0.5% Marcaine and 400 mg of peridium  Patient be treated for interstitial cystitis

## 2012-03-01 NOTE — Transfer of Care (Signed)
Immediate Anesthesia Transfer of Care Note  Patient: [name]  Procedure(s) Performed: Procedure(s) (LRB): CYSTOSCOPY/HYDRODISTENSION (N/A)  Patient Location: Patient transported to PACU with oxygen via face mask at 4 Liters / Min  Anesthesia Type: General  Level of Consciousness: awake and alert   Airway & Oxygen Therapy: Patient Spontanous Breathing and Patient connected to face mask oxygen  Post-op Assessment: Report given to PACU RN and Post -op Vital signs reviewed and stable  Post vital signs: Reviewed and stable  Dentition: Teeth and oropharynx remain in pre-op condition  Complications: No apparent anesthesia complications

## 2012-03-01 NOTE — Anesthesia Preprocedure Evaluation (Addendum)
Anesthesia Evaluation  Patient identified by MRN, date of birth, ID band Patient awake    Reviewed: Allergy & Precautions, H&P , NPO status , Patient's Chart, lab work & pertinent test results, reviewed documented beta blocker date and time   Airway Mallampati: II TM Distance: >3 FB Neck ROM: full    Dental No notable dental hx.    Pulmonary shortness of breath,  breath sounds clear to auscultation  Pulmonary exam normal       Cardiovascular Exercise Tolerance: Good negative cardio ROS  Rhythm:regular Rate:Normal  H/O cardiomegaly, palpitations, dyspnea on exertion, syncope.  ECG and ECHO OK.   Neuro/Psych PSYCHIATRIC DISORDERS  Neuromuscular disease negative neurological ROS  negative psych ROS   GI/Hepatic negative GI ROS, Neg liver ROS, GERD-  Medicated,  Endo/Other  Morbid obesity  Renal/GU Renal diseasenegative Renal ROSH/O kidney infection.  negative genitourinary   Musculoskeletal   Abdominal (+) + obese,   Peds  Hematology negative hematology ROS (+)   Anesthesia Other Findings   Reproductive/Obstetrics negative OB ROS Denies possibility of pregnancy. S/P tubal ligation                        Anesthesia Physical Anesthesia Plan  ASA: II  Anesthesia Plan: General   Post-op Pain Management:    Induction: Intravenous  Airway Management Planned: LMA  Additional Equipment:   Intra-op Plan:   Post-operative Plan: Extubation in OR  Informed Consent: I have reviewed the patients History and Physical, chart, labs and discussed the procedure including the risks, benefits and alternatives for the proposed anesthesia with the patient or authorized representative who has indicated his/her understanding and acceptance.   Dental Advisory Given  Plan Discussed with: CRNA  Anesthesia Plan Comments:         Anesthesia Quick Evaluation

## 2012-03-01 NOTE — Anesthesia Postprocedure Evaluation (Signed)
  Anesthesia Post-op Note  Patient: [name]  Procedure(s) Performed: Procedure(s) (LRB): CYSTOSCOPY/HYDRODISTENSION (N/A)  Patient Location: PACU  Anesthesia Type: General  Level of Consciousness: awake and alert   Airway and Oxygen Therapy: Patient Spontanous Breathing  Post-op Pain: mild  Post-op Assessment: Post-op Vital signs reviewed, Patient's Cardiovascular Status Stable, Respiratory Function Stable, Patent Airway and No signs of Nausea or vomiting  Last Vitals:  Filed Vitals:   03/01/12 1331  BP: 110/73  Pulse: 77  Temp: 36.8 C  Resp: 20    Post-op Vital Signs: stable   Complications: No apparent anesthesia complications

## 2012-03-02 ENCOUNTER — Encounter (HOSPITAL_BASED_OUTPATIENT_CLINIC_OR_DEPARTMENT_OTHER): Payer: Self-pay | Admitting: Urology

## 2012-03-04 LAB — POCT HEMOGLOBIN-HEMACUE: Hemoglobin: 12.9 g/dL (ref 12.0–15.0)

## 2012-11-21 ENCOUNTER — Emergency Department (HOSPITAL_COMMUNITY)
Admission: EM | Admit: 2012-11-21 | Discharge: 2012-11-21 | Disposition: A | Discharge status: Home / Self Care | Payer: 59 | Source: Home / Self Care | Attending: Emergency Medicine | Admitting: Emergency Medicine

## 2012-11-21 ENCOUNTER — Encounter (HOSPITAL_COMMUNITY): Payer: Self-pay | Admitting: Emergency Medicine

## 2012-11-21 DIAGNOSIS — J069 Acute upper respiratory infection, unspecified: Secondary | ICD-10-CM

## 2012-11-21 DIAGNOSIS — H6690 Otitis media, unspecified, unspecified ear: Secondary | ICD-10-CM

## 2012-11-21 MED ORDER — METHYLPREDNISOLONE 4 MG PO KIT: PACK | ORAL | Status: DC

## 2012-11-21 MED ORDER — FLUTICASONE PROPIONATE 50 MCG/ACT NA SUSP: 2.0000 | Freq: Every day | NASAL | Status: DC

## 2012-11-21 MED ORDER — CLARITHROMYCIN ER 500 MG PO TB24: 1000.0000 mg | ORAL_TABLET | Freq: Every day | ORAL | Status: DC

## 2012-11-21 NOTE — ED Notes (Signed)
Pt  Reports       Cough      Congested         r  Earache            With  Fever   With  Symptoms       Since  Last  Friday        Not releived  By otc  meds

## 2012-11-21 NOTE — ED Provider Notes (Signed)
CSN: [id number]     Arrival date & time 11/21/12  0802 History   First MD Initiated Contact with Patient 11/21/12 0820     Chief Complaint  Patient presents with  . URI   (Consider location/radiation/quality/duration/timing/severity/associated sxs/prior Treatment) HPI Comments: Cough, congestion, sinus pressure, chest tightness, and right ear ache. This all began approximately 10 days ago. She's been taking Sudafed, antihistamines, and NSAIDs but it has not been helping her symptoms. She has started to experience a fever of up to 100.5 as well over the last 2 days. She says overall, she also feels very fatigued and drained. Denies any abdominal complaints, blurry vision, or rash. 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  Patient is a 27 y.o. female presenting with URI.  URI Presenting symptoms: cough, ear pain, fatigue, fever and rhinorrhea   Presenting symptoms: no sore throat   Associated symptoms: no arthralgias and no myalgias     Past Medical History  Diagnosis Date  . UTI (urinary tract infection)   . Kidney infection   . Depression   . Overactive bladder   . GERD (gastroesophageal reflux disease)   . Pelvic pain    Past Surgical History  Procedure Laterality Date  . Cholecystectomy    . Cesarean section  2011  . Mouth surgery    . Cesarean section  2009  . Tubal ligation  2011  . Transthoracic echocardiogram  06/2009    report with chart  . Cysto with hydrodistension  03/01/2012    Procedure: CYSTOSCOPY/HYDRODISTENSION;  Surgeon: Martina Sinner, MD;  Location: [facility];  Service: Urology;  Laterality: N/A;  INSTILLATION of marcaine and pyridium.   History reviewed. No pertinent family history. History  Substance Use Topics  . Smoking status: Never Smoker   . Smokeless tobacco: Not on file  . Alcohol Use: Yes     Comment: rare   OB History   Grav Para Term Preterm Abortions TAB SAB Ect Mult Living                  Review of Systems  Constitutional: Positive for fever, chills and fatigue.  HENT: Positive for ear pain, postnasal drip and rhinorrhea. Negative for ear discharge, facial swelling, sinus pressure and sore throat.   Eyes: Negative for visual disturbance.  Respiratory: Positive for cough and chest tightness. Negative for shortness of breath.   Cardiovascular: Negative for chest pain, palpitations and leg swelling.  Gastrointestinal: Negative for nausea, vomiting and abdominal pain.  Endocrine: Negative for polydipsia and polyuria.  Genitourinary: Negative for dysuria, urgency and frequency.  Musculoskeletal: Negative for arthralgias and myalgias.  Skin: Negative for rash.  Neurological: Negative for dizziness, weakness and light-headedness.    Allergies  Review of patient's allergies indicates no known allergies.  Home Medications   Current Outpatient Rx  Name  Route  Sig  Dispense  Refill  . amitriptyline (ELAVIL) 10 MG tablet   Oral   Take 10 mg by mouth at bedtime.         . ciprofloxacin (CIPRO) 250 MG tablet   Oral   Take 1 tablet (250 mg total) by mouth 2 (two) times daily.   6 tablet   0   . clarithromycin (BIAXIN XL) 500 MG 24 hr tablet   Oral   Take 2 tablets (1,000 mg total) by mouth daily.   14 tablet   0   . desvenlafaxine (PRISTIQ) 100 MG 24 hr tablet  Oral   Take 100 mg by mouth daily.         . famotidine (PEPCID) 10 MG tablet   Oral   Take 10 mg by mouth 2 (two) times daily.         Marland Kitchen Fesoterodine Fumarate (TOVIAZ PO)   Oral   Take 1 tablet by mouth daily.         . fluticasone (FLONASE) 50 MCG/ACT nasal spray   Nasal   Place 2 sprays into the nose daily.   1 g   2   . HYDROcodone-acetaminophen (VICODIN) 5-500 MG per tablet   Oral   Take 1-2 tablets by mouth every 6 (six) hours as needed for pain.   30 tablet   0   . loratadine (CLARITIN) 10 MG tablet   Oral   Take 10 mg by mouth daily.         Marland Kitchen LORazepam (ATIVAN) 0.5 MG  tablet   Oral   Take 0.5 mg by mouth every other day.         . methylPREDNISolone (MEDROL DOSEPAK) 4 MG tablet      follow package directions   21 tablet   0     Dispense as written.   . ondansetron (ZOFRAN) 4 MG tablet   Oral   Take 1 tablet (4 mg total) by mouth every 6 (six) hours.   12 tablet   0   . oxyCODONE-acetaminophen (PERCOCET/ROXICET) 5-325 MG per tablet   Oral   Take 1-2 tablets by mouth every 6 (six) hours as needed for pain.   12 tablet   0   . solifenacin (VESICARE) 10 MG tablet   Oral   Take by mouth daily.         Marland Kitchen zolpidem (AMBIEN) 10 MG tablet   Oral   Take 10 mg by mouth every other day. Alternate days with the ativan.          BP 138/86  Pulse 88  Temp(Src) 98.6 F (37 C) (Oral)  Resp 14  SpO2 98%  LMP 10/10/2012 Physical Exam  Nursing note and vitals reviewed. Constitutional: She is oriented to person, place, and time. Vital signs are normal. She appears well-developed and well-nourished. No distress.  HENT:  Head: Normocephalic and atraumatic.  Right TM is erythematous, bulging, with air-fluid levels behind the TM. Left TM is similar but not as bad. There is mild erythema in the posterior oropharynx. There is tonsillar and superficial cervical lymphadenopathy on the right side only.  Eyes: Conjunctivae and EOM are normal. Pupils are equal, round, and reactive to light.  Neck: Normal range of motion. Neck supple.  Cardiovascular: Normal rate, regular rhythm and normal heart sounds.   Pulmonary/Chest: Effort normal and breath sounds normal. No respiratory distress. She has no wheezes. She has no rales.  Lymphadenopathy:    She has cervical adenopathy.  Neurological: She is alert and oriented to person, place, and time. She has normal strength. Coordination normal.  Skin: Skin is warm and dry. No rash noted. She is not diaphoretic.  Psychiatric: She has a normal mood and affect. Judgment normal.    ED Course  Procedures (including  critical care time) Labs Review Labs Reviewed - No data to display Imaging Review No results found.    MDM   1. AOM (acute otitis media), right   2. URI (upper respiratory infection)    Treat with antibiotics given the duration of her illness and physical exam findings. Followup  if not improving.   Meds ordered this encounter  Medications  . methylPREDNISolone (MEDROL DOSEPAK) 4 MG tablet    Sig: follow package directions    Dispense:  21 tablet    Refill:  0    Order Specific Question:  Supervising Provider    Answer:  Lorenz Coaster, DAVID C [id number]  . fluticasone (FLONASE) 50 MCG/ACT nasal spray    Sig: Place 2 sprays into the nose daily.    Dispense:  1 g    Refill:  2    Order Specific Question:  Supervising Provider    Answer:  Lorenz Coaster, DAVID C [id number]  . clarithromycin (BIAXIN XL) 500 MG 24 hr tablet    Sig: Take 2 tablets (1,000 mg total) by mouth daily.    Dispense:  14 tablet    Refill:  0    Order Specific Question:  Supervising Provider    Answer:  Lorenz Coaster, DAVID C [[id number]]       Graylon Good, PA-C 11/21/12 0830

## 2012-11-21 NOTE — ED Provider Notes (Signed)
Medical screening examination/treatment/procedure(s) were performed by non-physician practitioner and as supervising physician I was immediately available for consultation/collaboration.  Leslee Home, M.D.  Reuben Likes, MD 11/21/12 1435

## 2012-11-29 ENCOUNTER — Other Ambulatory Visit: Payer: Self-pay | Admitting: Urology

## 2013-01-05 ENCOUNTER — Other Ambulatory Visit: Payer: Self-pay | Admitting: Urology

## 2013-01-05 MED ORDER — PHENAZOPYRIDINE HCL 100 MG PO TABS: 200.0000 mg | ORAL_TABLET | Freq: Once | ORAL | Status: DC

## 2013-02-06 ENCOUNTER — Encounter (HOSPITAL_BASED_OUTPATIENT_CLINIC_OR_DEPARTMENT_OTHER): Payer: Self-pay | Admitting: *Deleted

## 2013-02-10 ENCOUNTER — Encounter (HOSPITAL_BASED_OUTPATIENT_CLINIC_OR_DEPARTMENT_OTHER): Payer: Self-pay | Admitting: *Deleted

## 2013-02-13 ENCOUNTER — Encounter (HOSPITAL_BASED_OUTPATIENT_CLINIC_OR_DEPARTMENT_OTHER): Payer: Self-pay | Admitting: *Deleted

## 2013-02-13 NOTE — H&P (Signed)
History of Present Illness   I dictated multiple notes on [name] in the past. There is a question of whether or not she has interstitial cystitis as noted and when I saw her in followup, she was doing well on Elavil and Uribel. She has mild mixed stress, urge incontinence. She is a Engineer, civil (consulting)nurse at [employer]. She can leak a little bit sometimes when she is sleeping. Obviously, if she ever had a sling, she would have a hydrodistention at the same time. When I saw her in April 2013, she was not helped by 2 antimuscarinics. She is 50% better on Myrbetriq. Two more months of medication is given. If I were to continue with the sling, she would need to be aware that some of her OAB symptoms such as enuresis and urgency may not improve things. A hydrodistention would be prudent. If she does have interstitial cystitis, her pain could be upregulated by a sling.   Review of systems: No change in bowel or neurologic systems.  She has had some positive cultures.    Past Medical History Problems  1. History of Anxiety (300.00) 2. History of Asthma (493.90) 3. History of depression (V11.8) 4. History of esophageal reflux (V12.79)  Surgical History Problems  1. History of Bladder Irrigation 2. History of Cesarean Section 3. History of Cholecystectomy 4. History of Cystoscopy With Dilation Of Bladder  Current Meds 1. Ambien TABS;  Therapy: (Recorded:18Feb2014) to Recorded 2. Amitriptyline HCl - 10 MG Oral Tablet; take 1 tablet by mouth at bedtime;  Therapy: 05Dec2012 to (Evaluate:18Jul2013)  Requested for: 26Sep2014; Last  Rx:18Jun2013; Status: ACTIVE - Renewal Denied Ordered 3. Ativan 1 MG Oral Tablet;  Therapy: (Recorded:21Aug2012) to Recorded 4. Biaxin 500 MG Oral Tablet;  Therapy: (Recorded:30Oct2014) to Recorded 5. Effexor XR 75 MG Oral Capsule Extended Release 24 Hour;  Therapy: (Recorded:30Oct2014) to Recorded 6. Myrbetriq 50 MG Oral Tablet Extended Release 24 Hour; Take 1 daily until  completed;  Therapy: 03Apr2014 to (Evaluate:29Mar2015)  Requested for: 03Apr2014; Last  Rx:03Apr2014 Ordered  Allergies Medication  1. No Known Drug Allergies  Family History Problems  1. Family history of Family Health Status Number Of Children   1 son 2. Family history of Hematuria 3. Family history of Hypertension (V17.49) : Father 4. Family history of Nephrolithiasis 5. Family history of Renal Cell Carcinoma (V16.51)  Social History Problems  1. Activities Of Daily Living 2. Denied: History of Alcohol Use 3. Caffeine Use   5-6 4. Exercise Habits   Is very busy with work and care of her 1141year old. No reg exer at this time. 5. Living Independently With Spouse 6. Marital History - Currently Married 7. Never A Smoker 8. Occupation:   Charity fundraiserN 9. Self-reliant In Usual Daily Activities 10. Denied: History of Tobacco Use  Vitals Vital Signs [Data Includes: Last 1 Day]  Recorded: 30Oct2014 12:00PM  Height: 5 ft 5 in Weight: 271 lb  BMI Calculated: 45.1 BSA Calculated: 2.25 Blood Pressure: 123 / 85, Sitting Temperature: 98.2 F, Oral Heart Rate: 93 Respiration: 20  Results/Data  Urine [Data Includes: Last 1 Day]   30Oct2014  COLOR YELLOW   APPEARANCE CLEAR   SPECIFIC GRAVITY 1.025   pH 6.0   GLUCOSE NEG mg/dL  BILIRUBIN NEG   KETONE NEG mg/dL  BLOOD MOD   PROTEIN NEG mg/dL  UROBILINOGEN 0.2 mg/dL  NITRITE NEG   LEUKOCYTE ESTERASE NEG   SQUAMOUS EPITHELIAL/HPF FEW   WBC 0-2 WBC/hpf  RBC 3-6 RBC/hpf  BACTERIA NONE SEEN  CRYSTALS NONE SEEN   CASTS NONE SEEN    Assessment Assessed  1. Chronic cystitis (595.2) 2. Urge and stress incontinence (788.33)  End of Encounter Meds  Medication Name Instruction  Ambien TABS (Zolpidem Tartrate)   Ativan 1 MG Oral Tablet (LORazepam)   Biaxin 500 MG Oral Tablet (Clarithromycin)   Effexor XR 75 MG Oral Capsule Extended Release 24 Hour (Venlafaxine HCl ER)   Enablex 7.5 MG Oral Tablet Extended Release 24 Hour 7.5  mg daily  Myrbetriq 50 MG Oral Tablet Extended Release 24 Hour Take 1 daily until completed.  Amitriptyline HCl - 10 MG Oral Tablet take 1 tablet by mouth at bedtime   Plan Chronic cystitis, Urge and stress incontinence  1. URINE CULTURE; Status:In Progress - Specimen/Data Collected;   Done: 30Oct2014 Urge and stress incontinence  2. Start: Enablex 7.5 MG Oral Tablet Extended Release 24 Hour; 7.5 mg daily  Discussion/Summary   I drew [name] a picture. I talked about watchful waiting versus physical therapy versus sling.   We talked about a sling in detail. Pros, cons, general surgical and anesthetic risks, and other options including behavioral therapy and watchful waiting were discussed. She understands that slings are generally successful in 90% of cases for stress incontinence, 50% for urge incontinence, and that in a small percentage of cases the incontinence can worsen. The risk of persistent, de novo, or worsening incontinence/dysfunction was discussed. Risks were described but not limited to the discussion of injury to neighboring structures including the bowel (with possible life-threatening sepsis and colostomy), bladder, urethra, vagina (all resulting in further surgery), and ureter (resulting in re-implantation). We also talked about the risk of retention requiring ureterolysis, extrusion requiring revision, and erosion resulting in further surgery. Bleeding risks and transfusion rates and the risk of infection were discussed. The risk of pelvic and abdominal pain syndromes, dyspareunia, and neuropathies were discussed. The need for CIC was described as well as the usual postoperative course. The patient understands that she might not reach her treatment goal and that she might be worse following surgery. Mesh TV issues were discussed.  TV issue discussed.  She really thinks she wants to proceed with surgery possibly next year. She will meet Pam today. She understands both  persistent OAD and/or enuresis. We would do a hydrodistension at the same time.  We talked about cystoscopy/hydrodistension and instillation in detail. Pros, cons, general surgical and anesthetic risks, and other options including watchful waiting were discussed. Risks were described but not limited to pain, infection, and bleeding. The risk of bladder perforation and management were discussed. The patient understands that it is primarily a diagnostic procedure.   She is on Elavil as a monotherapy, no longer on Uribel.   [name] also has had some dysuria in the last few days. She thinks she _____ a little bit more frequent than usual. Symptoms are mild. There is no other modifying factors or associated signs or symptoms. There is no other aggravating or relieving factors. The symptoms are mild in severity, but persisting.   Followup urine culture positive. She wants a trial of Enablex and samples and prescription given.   After a thorough review of the management options for the patient's condition the patient  elected to proceed with surgical therapy as noted above. We have discussed the potential benefits and risks of the procedure, side effects of the proposed treatment, the likelihood of the patient achieving the goals of the procedure, and any potential problems that might occur during  the procedure or recuperation. Informed consent has been obtained.

## 2013-02-13 NOTE — Progress Notes (Signed)
NPO AFTER MN. ARRIVE AT 0930. NEEDS CBC, PT/INR, PTT, T &S, AND BMET. WILL TAKE EFFEXOR AM DOS W/ SIPS OF WATER.

## 2013-02-14 ENCOUNTER — Encounter (HOSPITAL_BASED_OUTPATIENT_CLINIC_OR_DEPARTMENT_OTHER)
Admission: RE | Disposition: A | Discharge status: Home / Self Care | Payer: Self-pay | Source: Ambulatory Visit | Attending: Urology

## 2013-02-14 ENCOUNTER — Ambulatory Visit (HOSPITAL_BASED_OUTPATIENT_CLINIC_OR_DEPARTMENT_OTHER): Payer: 59 | Admitting: Anesthesiology

## 2013-02-14 ENCOUNTER — Encounter (HOSPITAL_BASED_OUTPATIENT_CLINIC_OR_DEPARTMENT_OTHER): Payer: 59 | Admitting: Anesthesiology

## 2013-02-14 ENCOUNTER — Ambulatory Visit (HOSPITAL_BASED_OUTPATIENT_CLINIC_OR_DEPARTMENT_OTHER)
Admission: RE | Admit: 2013-02-14 | Discharge: 2013-02-14 | Disposition: A | Discharge status: Home / Self Care | Payer: 59 | Source: Ambulatory Visit | Attending: Urology | Admitting: Urology

## 2013-02-14 ENCOUNTER — Encounter (HOSPITAL_BASED_OUTPATIENT_CLINIC_OR_DEPARTMENT_OTHER): Payer: Self-pay | Admitting: Anesthesiology

## 2013-02-14 DIAGNOSIS — J45909 Unspecified asthma, uncomplicated: Secondary | ICD-10-CM | POA: Insufficient documentation

## 2013-02-14 DIAGNOSIS — N949 Unspecified condition associated with female genital organs and menstrual cycle: Secondary | ICD-10-CM | POA: Insufficient documentation

## 2013-02-14 DIAGNOSIS — N393 Stress incontinence (female) (male): Secondary | ICD-10-CM | POA: Insufficient documentation

## 2013-02-14 DIAGNOSIS — K219 Gastro-esophageal reflux disease without esophagitis: Secondary | ICD-10-CM | POA: Insufficient documentation

## 2013-02-14 HISTORY — DX: Stress incontinence (female) (male): N39.3

## 2013-02-14 HISTORY — PX: PUBOVAGINAL SLING: SHX1035

## 2013-02-14 LAB — BASIC METABOLIC PANEL
BUN: 7 mg/dL (ref 6–23)
CO2: 24 mEq/L (ref 19–32)
Calcium: 8.6 mg/dL (ref 8.4–10.5)
Chloride: 104 mEq/L (ref 96–112)
Creatinine, Ser: 0.68 mg/dL (ref 0.50–1.10)
GFR calc Af Amer: >90 mL/min (ref >90)
GFR calc non Af Amer: >90 mL/min (ref >90)
Glucose, Bld: 107 mg/dL — ABNORMAL HIGH (ref 70–99)
Potassium: 3.4 mEq/L — ABNORMAL LOW (ref 3.7–5.3)
Sodium: 141 mEq/L (ref 137–147)

## 2013-02-14 LAB — ABO/RH: ABO/RH(D): A POS

## 2013-02-14 LAB — CBC
HCT: 39.2 % (ref 36.0–46.0)
Hemoglobin: 13.1 g/dL (ref 12.0–15.0)
MCH: 30.6 pg (ref 26.0–34.0)
MCHC: 33.4 g/dL (ref 30.0–36.0)
MCV: 91.6 fL (ref 78.0–100.0)
Platelets: 246 10*3/uL (ref 150–400)
RBC: 4.28 MIL/uL (ref 3.87–5.11)
RDW: 13.9 % (ref 11.5–15.5)
WBC: 5.7 10*3/uL (ref 4.0–10.5)

## 2013-02-14 LAB — TYPE AND SCREEN
ABO/RH(D): A POS
Antibody Screen: NEGATIVE

## 2013-02-14 LAB — APTT: aPTT: 30 seconds (ref 24–37)

## 2013-02-14 LAB — PROTIME-INR
INR: 1.03 (ref 0.00–1.49)
Prothrombin Time: 13.3 seconds (ref 11.6–15.2)

## 2013-02-14 SURGERY — CREATION, PUBOVAGINAL SLING
Anesthesia: General | Site: Bladder

## 2013-02-14 MED ORDER — METOCLOPRAMIDE HCL 5 MG/ML IJ SOLN
INTRAMUSCULAR | Status: DC | PRN
  Administered 2013-02-14: 10 mg via INTRAVENOUS

## 2013-02-14 MED ORDER — MIDAZOLAM HCL 2 MG/2ML IJ SOLN
INTRAMUSCULAR | Status: AC
  Filled 2013-02-14: qty 2

## 2013-02-14 MED ORDER — FENTANYL CITRATE 0.05 MG/ML IJ SOLN
INTRAMUSCULAR | Status: DC | PRN
  Administered 2013-02-14 (×4): 50 ug via INTRAVENOUS

## 2013-02-14 MED ORDER — FENTANYL CITRATE 0.05 MG/ML IJ SOLN
INTRAMUSCULAR | Status: AC
  Filled 2013-02-14: qty 4

## 2013-02-14 MED ORDER — MIDAZOLAM HCL 5 MG/5ML IJ SOLN
INTRAMUSCULAR | Status: DC | PRN
  Administered 2013-02-14: 2 mg via INTRAVENOUS

## 2013-02-14 MED ORDER — LIDOCAINE HCL (CARDIAC) 20 MG/ML IV SOLN
INTRAVENOUS | Status: DC | PRN
  Administered 2013-02-14: 80 mg via INTRAVENOUS

## 2013-02-14 MED ORDER — NEOSTIGMINE METHYLSULFATE 1 MG/ML IJ SOLN
INTRAMUSCULAR | Status: DC | PRN
  Administered 2013-02-14: 4 mg via INTRAVENOUS

## 2013-02-14 MED ORDER — OXYCODONE-ACETAMINOPHEN 5-325 MG PO TABS
1.0000 | ORAL_TABLET | ORAL | Status: DC | PRN
  Administered 2013-02-14: 1 via ORAL
  Filled 2013-02-14: qty 2

## 2013-02-14 MED ORDER — DEXAMETHASONE SODIUM PHOSPHATE 4 MG/ML IJ SOLN
INTRAMUSCULAR | Status: DC | PRN
  Administered 2013-02-14: 10 mg via INTRAVENOUS

## 2013-02-14 MED ORDER — ROCURONIUM BROMIDE 100 MG/10ML IV SOLN
INTRAVENOUS | Status: DC | PRN
  Administered 2013-02-14: 30 mg via INTRAVENOUS

## 2013-02-14 MED ORDER — LIDOCAINE-EPINEPHRINE (PF) 1 %-1:200000 IJ SOLN
INTRAMUSCULAR | Status: DC | PRN
  Administered 2013-02-14: 10 mL

## 2013-02-14 MED ORDER — SODIUM CHLORIDE 0.9 % IV SOLN
1.5000 g | INTRAVENOUS | Status: AC
  Administered 2013-02-14: 1.5 g via INTRAVENOUS
  Filled 2013-02-14: qty 1.5

## 2013-02-14 MED ORDER — OXYCODONE-ACETAMINOPHEN 5-325 MG PO TABS
2.0000 | ORAL_TABLET | ORAL | Status: DC | PRN
  Filled 2013-02-14 (×2): qty 2

## 2013-02-14 MED ORDER — LACTATED RINGERS IV SOLN
INTRAVENOUS | Status: DC
  Administered 2013-02-14 (×2): via INTRAVENOUS
  Filled 2013-02-14: qty 1000

## 2013-02-14 MED ORDER — STERILE WATER FOR IRRIGATION IR SOLN
Status: DC | PRN
  Administered 2013-02-14: 3000 mL

## 2013-02-14 MED ORDER — SUCCINYLCHOLINE CHLORIDE 20 MG/ML IJ SOLN
INTRAMUSCULAR | Status: DC | PRN
  Administered 2013-02-14: 140 mg via INTRAVENOUS

## 2013-02-14 MED ORDER — LACTATED RINGERS IV SOLN
INTRAVENOUS | Status: DC
  Filled 2013-02-14: qty 1000

## 2013-02-14 MED ORDER — KETOROLAC TROMETHAMINE 30 MG/ML IJ SOLN
INTRAMUSCULAR | Status: DC | PRN
  Administered 2013-02-14: 30 mg via INTRAVENOUS

## 2013-02-14 MED ORDER — PROMETHAZINE HCL 25 MG/ML IJ SOLN
6.2500 mg | INTRAMUSCULAR | Status: DC | PRN
  Filled 2013-02-14: qty 1

## 2013-02-14 MED ORDER — ACETAMINOPHEN 10 MG/ML IV SOLN
INTRAVENOUS | Status: DC | PRN
  Administered 2013-02-14: 1000 mg via INTRAVENOUS

## 2013-02-14 MED ORDER — HYDROMORPHONE HCL PF 1 MG/ML IJ SOLN
0.2500 mg | INTRAMUSCULAR | Status: DC | PRN
  Filled 2013-02-14: qty 1

## 2013-02-14 MED ORDER — GENTAMICIN IN SALINE 1.6-0.9 MG/ML-% IV SOLN
80.0000 mg | INTRAVENOUS | Status: AC
  Administered 2013-02-14: 415 mg via INTRAVENOUS
  Filled 2013-02-14: qty 50

## 2013-02-14 MED ORDER — PHENAZOPYRIDINE HCL 200 MG PO TABS
200.0000 mg | ORAL_TABLET | Freq: Once | ORAL | Status: AC
  Administered 2013-02-14: 200 mg via ORAL
  Filled 2013-02-14: qty 1
  Filled 2013-02-14: qty 2

## 2013-02-14 MED ORDER — OXYCODONE-ACETAMINOPHEN 10-650 MG PO TABS: 1.0000 | ORAL_TABLET | Freq: Four times a day (QID) | ORAL | Status: DC | PRN

## 2013-02-14 MED ORDER — AMPICILLIN-SULBACTAM SODIUM 1.5 (1-0.5) G IJ SOLR
INTRAMUSCULAR | Status: AC
  Filled 2013-02-14: qty 1.5

## 2013-02-14 MED ORDER — CIPROFLOXACIN HCL 250 MG PO TABS: 250.0000 mg | ORAL_TABLET | Freq: Two times a day (BID) | ORAL | Status: DC

## 2013-02-14 MED ORDER — POLYMYXIN B SULFATE 500000 UNITS IJ SOLR
INTRAMUSCULAR | Status: DC | PRN
  Administered 2013-02-14: 12:00:00

## 2013-02-14 MED ORDER — EPHEDRINE SULFATE 50 MG/ML IJ SOLN
INTRAMUSCULAR | Status: DC | PRN
  Administered 2013-02-14 (×3): 10 mg via INTRAVENOUS

## 2013-02-14 MED ORDER — GLYCOPYRROLATE 0.2 MG/ML IJ SOLN
INTRAMUSCULAR | Status: DC | PRN
  Administered 2013-02-14: 0.2 mg via INTRAVENOUS
  Administered 2013-02-14: 0.6 mg via INTRAVENOUS

## 2013-02-14 MED ORDER — ONDANSETRON HCL 4 MG/2ML IJ SOLN
INTRAMUSCULAR | Status: DC | PRN
  Administered 2013-02-14: 4 mg via INTRAVENOUS

## 2013-02-14 MED ORDER — PROPOFOL 10 MG/ML IV BOLUS
INTRAVENOUS | Status: DC | PRN
  Administered 2013-02-14: 200 mg via INTRAVENOUS

## 2013-02-14 SURGICAL SUPPLY — 53 items
BAG URINE DRAINAGE (UROLOGICAL SUPPLIES) IMPLANT
BLADE HEX COATED 2.75 (ELECTRODE) ×2 IMPLANT
BLADE SURG 10 STRL SS (BLADE) ×2 IMPLANT
BLADE SURG 15 STRL LF DISP TIS (BLADE) ×1 IMPLANT
BLADE SURG 15 STRL SS (BLADE) ×1
BLADE SURG ROTATE 9660 (MISCELLANEOUS) ×2 IMPLANT
CANISTER SUCT LVC 12 LTR MEDI- (MISCELLANEOUS) ×2 IMPLANT
CANISTER SUCTION 1200CC (MISCELLANEOUS) ×2 IMPLANT
CATH FOLEY 2WAY SLVR  5CC 16FR (CATHETERS) ×1
CATH FOLEY 2WAY SLVR 5CC 16FR (CATHETERS) ×1 IMPLANT
CLOTH BEACON ORANGE TIMEOUT ST (SAFETY) ×2 IMPLANT
COVER MAYO STAND STRL (DRAPES) ×4 IMPLANT
COVER TABLE BACK 60X90 (DRAPES) ×2 IMPLANT
DERMABOND ADVANCED (GAUZE/BANDAGES/DRESSINGS) ×1
DERMABOND ADVANCED .7 DNX12 (GAUZE/BANDAGES/DRESSINGS) ×1 IMPLANT
DRAIN PENROSE 18X1/4 LTX STRL (WOUND CARE) IMPLANT
DRAPE CAMERA CLOSED 9X96 (DRAPES) ×2 IMPLANT
DRAPE UNDERBUTTOCKS STRL (DRAPE) ×2 IMPLANT
ELECT REM PT RETURN 9FT ADLT (ELECTROSURGICAL) ×2
ELECTRODE REM PT RTRN 9FT ADLT (ELECTROSURGICAL) ×1 IMPLANT
GAUZE SPONGE 4X4 16PLY XRAY LF (GAUZE/BANDAGES/DRESSINGS) IMPLANT
GLOVE BIO SURGEON STRL SZ7.5 (GLOVE) ×4 IMPLANT
GOWN STRL NON-REIN LRG LVL3 (GOWN DISPOSABLE) ×4 IMPLANT
GOWN STRL REIN XL XLG (GOWN DISPOSABLE) IMPLANT
GOWN STRL REUS W/TWL LRG LVL3 (GOWN DISPOSABLE) ×2 IMPLANT
GOWN STRL REUS W/TWL XL LVL3 (GOWN DISPOSABLE) ×4 IMPLANT
HOLDER FOLEY CATH W/STRAP (MISCELLANEOUS) ×2 IMPLANT
HOOK RETRACTION 12 ELAST STAY (MISCELLANEOUS) IMPLANT
NEEDLE HYPO 22GX1.5 SAFETY (NEEDLE) ×2 IMPLANT
NS IRRIG 500ML POUR BTL (IV SOLUTION) IMPLANT
PACK BASIN DAY SURGERY FS (CUSTOM PROCEDURE TRAY) ×2 IMPLANT
PACKING VAGINAL (PACKING) ×2 IMPLANT
PENCIL BUTTON HOLSTER BLD 10FT (ELECTRODE) ×2 IMPLANT
PLUG CATH AND CAP STER (CATHETERS) ×2 IMPLANT
RETRACTOR STERILE 25.8CMX11.3 (INSTRUMENTS) IMPLANT
SET IRRIG Y TYPE TUR BLADDER L (SET/KITS/TRAYS/PACK) ×2 IMPLANT
SHEET LAVH (DRAPES) ×2 IMPLANT
SLING SYSTEM SPARC (Sling) ×2 IMPLANT
SURGILUBE 2OZ TUBE FLIPTOP (MISCELLANEOUS) ×2 IMPLANT
SUT SILK 2 0 30  PSL (SUTURE)
SUT SILK 2 0 30 PSL (SUTURE) IMPLANT
SUT VIC AB 2-0 CT1 27 (SUTURE) ×2
SUT VIC AB 2-0 CT1 TAPERPNT 27 (SUTURE) ×2 IMPLANT
SUT VICRYL 4-0 PS2 18IN ABS (SUTURE) ×2 IMPLANT
SYR BULB IRRIGATION 50ML (SYRINGE) ×2 IMPLANT
SYR CONTROL 10ML LL (SYRINGE) ×2 IMPLANT
SYRINGE 10CC LL (SYRINGE) ×2 IMPLANT
TOWEL OR 17X24 6PK STRL BLUE (TOWEL DISPOSABLE) ×2 IMPLANT
TRAY DSU PREP LF (CUSTOM PROCEDURE TRAY) ×2 IMPLANT
TUBE CONNECTING 12X1/4 (SUCTIONS) ×4 IMPLANT
WATER STERILE IRR 3000ML UROMA (IV SOLUTION) ×2 IMPLANT
WATER STERILE IRR 500ML POUR (IV SOLUTION) ×2 IMPLANT
YANKAUER SUCT BULB TIP NO VENT (SUCTIONS) ×2 IMPLANT

## 2013-02-14 NOTE — Discharge Instructions (Signed)
I have reviewed discharge instructions in detail with the patient. They will follow-up with me or their physician as scheduled. My nurse will also be calling the patients as per protocol.  ° °Post Anesthesia Home Care Instructions ° °Activity: °Get plenty of rest for the remainder of the day. A responsible adult should stay with you for 24 hours following the procedure.  °For the next 24 hours, DO NOT: °-Drive a car °-Operate machinery °-Drink alcoholic beverages °-Take any medication unless instructed by your physician °-Make any legal decisions or sign important papers. ° °Meals: °Start with liquid foods such as gelatin or soup. Progress to regular foods as tolerated. Avoid greasy, spicy, heavy foods. If nausea and/or vomiting occur, drink only clear liquids until the nausea and/or vomiting subsides. Call your physician if vomiting continues. ° °Special Instructions/Symptoms: °Your throat may feel dry or sore from the anesthesia or the breathing tube placed in your throat during surgery. If this causes discomfort, gargle with warm salt water. The discomfort should disappear within 24 hours. °HOME CARE INSTRUCTIONS FOR VAGINAL SLING ° °Activity: ° -No lifting greater than 10-15 pounds for  *** weeks, or as instructed by your physician. ° -No driving a car for one week. ° -No sexual intercourse for  *** weeks. ° °Diet: ° You may return to your normal diet tomorrow.   It is important to keep your bowels regular during the postoperative period.  To avoid constipation, drink   plenty of fluids during the day (8-10 glasses) and eat plenty of fresh fruits and vegetables.  Use a mild laxative or stool softener if necessary. ° °Wound Care: ° You may begin showering in  *** days, or as instructed by your physician.  If you have a suprapubic tube, keep the site clean and dry except when showering.    After your shower, lightly pat the area dry and apply a clean, dry dressing as instructed by the nurse. °  °Return to Work as  instructed by your physician.   ° °Special Instructions: ° ° Call your physician if any of these symptoms occur: °  -temperature greater than 101 degrees Farenheit. °  -redness, swelling or drainage at incision site. °  -foul odor of your urine. °  -a significant decrease in the amount of urine you have every day. °  -severe pain not relieved by your pain medication. ° ° Trial Voiding - refer to Trial Voiding Fact Sheet. ° °Return to see your doctor in *** weeks. °Call to set up a follow-up appointment. ° °Patient Signature:  ________________________________________________________ ° °Nurse's Signature:  ________________________________________________________ °

## 2013-02-14 NOTE — Interval H&P Note (Signed)
History and Physical Interval Note:  02/14/2013 10:13 AM  [name]  has presented today for surgery, with the diagnosis of STRESS INCONTINENCE PELVIC PAIN   The various methods of treatment have been discussed with the patient and family. After consideration of risks, benefits and other options for treatment, the patient has consented to  Procedure(s): PUBO-VAGINAL SLING AND HYDRODISTENSION (N/A) as a surgical intervention .  The patient's history has been reviewed, patient examined, no change in status, stable for surgery.  I have reviewed the patient's chart and labs.  Questions were answered to the patient's satisfaction.     Jonni Oelkers A

## 2013-02-14 NOTE — Transfer of Care (Signed)
Immediate Anesthesia Transfer of Care Note  Patient: [name]  Procedure(s) Performed: Procedure(s) (LRB): PUBO-VAGINAL SLING AND HYDRODISTENSION (N/A)  Patient Location: PACU  Anesthesia Type: General  Level of Consciousness: awake, alert  and oriented  Airway & Oxygen Therapy: Patient Spontanous Breathing and Patient connected to face mask oxygen  Post-op Assessment: Report given to PACU RN and Post -op Vital signs reviewed and stable  Post vital signs: Reviewed and stable  Complications: No apparent anesthesia complications

## 2013-02-14 NOTE — Anesthesia Procedure Notes (Signed)
Procedure Name: Intubation Date/Time: 02/14/2013 11:16 AM Performed by: Norva PavlovALLAWAY, Malaiya Paczkowski G Pre-anesthesia Checklist: Patient identified, Emergency Drugs available, Suction available and Patient being monitored Patient Re-evaluated:Patient Re-evaluated prior to inductionOxygen Delivery Method: Circle System Utilized Preoxygenation: Pre-oxygenation with 100% oxygen Intubation Type: IV induction Ventilation: Mask ventilation without difficulty Laryngoscope Size: Mac and 3 Grade View: Grade I Tube type: Oral Tube size: 7.0 mm Number of attempts: 1 Airway Equipment and Method: stylet Placement Confirmation: ETT inserted through vocal cords under direct vision,  positive ETCO2 and breath sounds checked- equal and bilateral Secured at: 20 cm Tube secured with: Tape Dental Injury: Teeth and Oropharynx as per pre-operative assessment

## 2013-02-14 NOTE — Anesthesia Preprocedure Evaluation (Signed)
Anesthesia Evaluation  Patient identified by MRN, date of birth, ID band Patient awake    Reviewed: Allergy & Precautions, H&P , NPO status , Patient's Chart, lab work & pertinent test results, reviewed documented beta blocker date and time   Airway Mallampati: II TM Distance: >3 FB Neck ROM: full    Dental no notable dental hx. (+) Teeth Intact and Dental Advisory Given   Pulmonary shortness of breath,  breath sounds clear to auscultation  Pulmonary exam normal       Cardiovascular Exercise Tolerance: Good negative cardio ROS  Rhythm:regular Rate:Normal  H/O cardiomegaly, palpitations, dyspnea on exertion, syncope.  ECG and ECHO OK.   Neuro/Psych PSYCHIATRIC DISORDERS Anxiety Depression  Neuromuscular disease negative neurological ROS  negative psych ROS   GI/Hepatic Neg liver ROS, GERD-  Medicated,  Endo/Other  Morbid obesity  Renal/GU Renal diseasenegative Renal ROSH/O kidney infection.  negative genitourinary   Musculoskeletal   Abdominal (+) + obese,   Peds  Hematology negative hematology ROS (+)   Anesthesia Other Findings   Reproductive/Obstetrics negative OB ROS Denies possibility of pregnancy. S/P tubal ligation                           Anesthesia Physical Anesthesia Plan  ASA: II  Anesthesia Plan: General   Post-op Pain Management:    Induction: Intravenous  Airway Management Planned: Oral ETT  Additional Equipment:   Intra-op Plan:   Post-operative Plan: Extubation in OR  Informed Consent: I have reviewed the patients History and Physical, chart, labs and discussed the procedure including the risks, benefits and alternatives for the proposed anesthesia with the patient or authorized representative who has indicated his/her understanding and acceptance.   Dental advisory given  Plan Discussed with: CRNA  Anesthesia Plan Comments:         Anesthesia Quick  Evaluation

## 2013-02-14 NOTE — Op Note (Signed)
Preoperative diagnosis: Stress urinary incontinence and pelvic pain  Postoperative diagnosis: Stress urinary incontinence and pelvic pain  Procedure: Sling cystourethropexy [facility]) and cystoscopy and hydrodistension  Surgeon: Dr. Lorin PicketScott Ruhama Lehew  Asstistant: None  Estimated blood loss: < 50 milliliters  The patient was prepped and draped in the usual fashion. Extra care was taken with leg positioning to minimize the  risk of compartment syndrome, neuropathy, and deep vein thrombosis. Preoperative laboratory tests were normal and preoperative antibiotics were given.  Two less than 1 cm incisions were made 1 fingerbreadth above the symphysis pubis 1.5 cm lateral to the midline. A 2 cm appropriate depth suburethral incision was made underneath the mid urethra after instilling approximately 5 cc of 1% lidocaine mixture. I sharply and bluntly dissected to the urethral vesical angle bilaterally. There were a lot of mucosa folds and I was diligent in marking the mid urethra.   With the bladder empty I passed a [facility] needle on top of and along the back of the symphysis pubis staying  on the periosteum and staying lateral using my box technique and delivering the needle onto the pulp of my  index finger bilaterally.  I cystoscoped the patient thoroughly and there was no injury to the bladder or urethra. There was no movement  or indentation of the bladder with movement of the trocar. There was excellent efflux of yellow urine bilaterally.  With the bladder emptied I attached the [facility] sl[facility] and brought it up through the retropubic space bilaterally. I tensioned it over the fat part of a moderate size Kelly clamp. I cut below the blue dots, irrigated the sheaths, and removed the sheaths. I was very happy with the position and tension of the sling With appropriate hypermobility and no springback effect.  All incisions were irrigated. The sling was cut below the skin level. I closed the anterior  vaginal wall with  running 2-0 Vicryl followed by my interrupted sutures. Interrupted 4-0 Vicryl was used for the abdominal incisions.  I hydrodistended to 500 ml and reinspected after emptying. There were no glomerulations or uclers.   A catheter plug was used with the Foley catheter as well as a vaginal pack.  The patient was taken to the recovery room and hopefully this procedure will reach her treatment goal.

## 2013-02-15 ENCOUNTER — Encounter (HOSPITAL_BASED_OUTPATIENT_CLINIC_OR_DEPARTMENT_OTHER): Payer: Self-pay | Admitting: Urology

## 2013-02-15 NOTE — Anesthesia Postprocedure Evaluation (Signed)
Anesthesia Post Note  Patient: Samantha JarredJennifer D Kramer  Procedure(s) Performed: Procedure(s) (LRB): PUBO-VAGINAL SLING AND HYDRODISTENSION (N/A)  Anesthesia type: General  Patient location: PACU  Post pain: Pain level controlled  Post assessment: Post-op Vital signs reviewed  Last Vitals:  Filed Vitals:   02/14/13 1400  BP: 99/63  Pulse: 100  Temp: 36.3 C  Resp:     Post vital signs: Reviewed  Level of consciousness: sedated  Complications: No apparent anesthesia complications

## 2013-04-18 ENCOUNTER — Emergency Department (HOSPITAL_COMMUNITY)
Admission: EM | Admit: 2013-04-18 | Discharge: 2013-04-18 | Disposition: A | Discharge status: Home / Self Care | Payer: 59 | Source: Home / Self Care | Attending: Emergency Medicine | Admitting: Emergency Medicine

## 2013-04-18 ENCOUNTER — Emergency Department (INDEPENDENT_AMBULATORY_CARE_PROVIDER_SITE_OTHER): Payer: 59

## 2013-04-18 ENCOUNTER — Encounter (HOSPITAL_COMMUNITY): Payer: Self-pay | Admitting: Emergency Medicine

## 2013-04-18 DIAGNOSIS — S93609A Unspecified sprain of unspecified foot, initial encounter: Secondary | ICD-10-CM

## 2013-04-18 DIAGNOSIS — S93601A Unspecified sprain of right foot, initial encounter: Secondary | ICD-10-CM

## 2013-04-18 MED ORDER — IBUPROFEN 800 MG PO TABS
800.0000 mg | ORAL_TABLET | Freq: Once | ORAL | Status: AC
  Administered 2013-04-18: 800 mg via ORAL

## 2013-04-18 MED ORDER — IBUPROFEN 800 MG PO TABS
ORAL_TABLET | ORAL | Status: AC
  Filled 2013-04-18: qty 1

## 2013-04-18 NOTE — Discharge Instructions (Signed)
Your xrays were without evidence of fracture. Ace wrap and post op shoe as needed for comfort. Do not sleep in ACE wrap. Ibuprofen as directed on packaging for pain. Follow up with orthopedist listed on your discharge paperwork if no improvement over the next 2 weeks. Activity as tolerated  Foot Sprain The muscles and cord like structures which attach muscle to bone (tendons) that surround the feet are made up of units. A foot sprain can occur at the weakest spot in any of these units. This condition is most often caused by injury to or overuse of the foot, as from playing contact sports, or aggravating a previous injury, or from poor conditioning, or obesity. SYMPTOMS  Pain with movement of the foot.  Tenderness and swelling at the injury site.  Loss of strength is present in moderate or severe sprains. THE THREE GRADES OR SEVERITY OF FOOT SPRAIN ARE:  Mild (Grade I): Slightly pulled muscle without tearing of muscle or tendon fibers or loss of strength.  Moderate (Grade II): Tearing of fibers in a muscle, tendon, or at the attachment to bone, with small decrease in strength.  Severe (Grade III): Rupture of the muscle-tendon-bone attachment, with separation of fibers. Severe sprain requires surgical repair. Often repeating (chronic) sprains are caused by overuse. Sudden (acute) sprains are caused by direct injury or over-use. DIAGNOSIS  Diagnosis of this condition is usually by your own observation. If problems continue, a caregiver may be required for further evaluation and treatment. X-rays may be required to make sure there are not breaks in the bones (fractures) present. Continued problems may require physical therapy for treatment. PREVENTION  Use strength and conditioning exercises appropriate for your sport.  Warm up properly prior to working out.  Use athletic shoes that are made for the sport you are participating in.  Allow adequate time for healing. Early return to activities  makes repeat injury more likely, and can lead to an unstable arthritic foot that can result in prolonged disability. Mild sprains generally heal in 3 to 10 days, with moderate and severe sprains taking 2 to 10 weeks. Your caregiver can help you determine the proper time required for healing. HOME CARE INSTRUCTIONS   Apply ice to the injury for 15-20 minutes, 03-04 times per day. Put the ice in a plastic bag and place a towel between the bag of ice and your skin.  An elastic wrap (like an Ace bandage) may be used to keep swelling down.  Keep foot above the level of the heart, or at least raised on a footstool, when swelling and pain are present.  Try to avoid use other than gentle range of motion while the foot is painful. Do not resume use until instructed by your caregiver. Then begin use gradually, not increasing use to the point of pain. If pain does develop, decrease use and continue the above measures, gradually increasing activities that do not cause discomfort, until you gradually achieve normal use.  Use crutches if and as instructed, and for the length of time instructed.  Keep injured foot and ankle wrapped between treatments.  Massage foot and ankle for comfort and to keep swelling down. Massage from the toes up towards the knee.  Only take over-the-counter or prescription medicines for pain, discomfort, or fever as directed by your caregiver. SEEK IMMEDIATE MEDICAL CARE IF:   Your pain and swelling increase, or pain is not controlled with medications.  You have loss of feeling in your foot or your foot turns  cold or blue.  You develop new, unexplained symptoms, or an increase of the symptoms that brought you to your caregiver. MAKE SURE YOU:   Understand these instructions.  Will watch your condition.  Will get help right away if you are not doing well or get worse. Document Released: 07/04/2001 Document Revised: 04/06/2011 Document Reviewed: 09/01/2007 [facility] Patient  Information 2014 [employer], [place].

## 2013-04-18 NOTE — ED Notes (Signed)
Med  Fem   Ortho   Shoe           Ace  Bandage  Applied

## 2013-04-18 NOTE — ED Notes (Signed)
inj  r  Foot   3  Days  Ago  Pain    Present  Worse  On  Weight  Bearing              Pt  Reports    Was  Wearing  New  Shoes            Pain  Worse  On  Weight  Bearing

## 2013-04-18 NOTE — ED Provider Notes (Signed)
CSN: [id number]     Arrival date & time 04/18/13  1309 History   First MD Initiated Contact with Patient 04/18/13 1503     Chief Complaint  Patient presents with  . Foot Pain   (Consider location/radiation/quality/duration/timing/severity/associated sxs/prior Treatment) HPI Comments: Patient states her foot slipped out of the back of her clog while at work on Monday night and she twisted her right foot and has had persistent discomfort with weight bearing on right foot. No previous injury or surgery.   Patient is a 28 y.o. female presenting with lower extremity pain. The history is provided by the patient.  Foot Pain This is a new problem. The current episode started yesterday. The problem occurs constantly. The problem has not changed since onset.   Past Medical History  Diagnosis Date  . Depression   . Overactive bladder   . GERD (gastroesophageal reflux disease)   . Pelvic pain   . SUI (stress urinary incontinence, female)    Past Surgical History  Procedure Laterality Date  . Cesarean section  2009  &  2011    2011 W/ BILATERAL TUBAL LIGATION  . Transthoracic echocardiogram  06/2009  . Cysto with hydrodistension  03/01/2012    Procedure: CYSTOSCOPY/HYDRODISTENSION;  Surgeon: Martina Sinner, MD;  Location: [facility];  Service: Urology;  Laterality: N/A;  INSTILLATION of marcaine and pyridium.  . Cholecystectomy  2010  . Pubovaginal sling N/A 02/14/2013    Procedure: PUBO-VAGINAL SLING AND HYDRODISTENSION;  Surgeon: Martina Sinner, MD;  Location: [facility];  Service: Urology;  Laterality: N/A;   History reviewed. No pertinent family history. History  Substance Use Topics  . Smoking status: Never Smoker   . Smokeless tobacco: Never Used  . Alcohol Use: Yes     Comment: rare   OB History   Grav Para Term Preterm Abortions TAB SAB Ect Mult Living                 Review of Systems  All other systems reviewed and are  negative.    Allergies  Review of patient's allergies indicates no known allergies.  Home Medications   Current Outpatient Rx  Name  Route  Sig  Dispense  Refill  . amitriptyline (ELAVIL) 10 MG tablet   Oral   Take 10 mg by mouth at bedtime.         . ciprofloxacin (CIPRO) 250 MG tablet   Oral   Take 1 tablet (250 mg total) by mouth 2 (two) times daily.   10 tablet   0   . famotidine (PEPCID) 10 MG tablet   Oral   Take 10 mg by mouth every evening.          . fluticasone (FLONASE) 50 MCG/ACT nasal spray   Nasal   Place 2 sprays into the nose as needed.         . loratadine (CLARITIN) 10 MG tablet   Oral   Take 10 mg by mouth daily.         Marland Kitchen LORazepam (ATIVAN) 0.5 MG tablet   Oral   Take 0.5 mg by mouth as needed.          . Multiple Vitamin (MULTIVITAMIN) tablet   Oral   Take 1 tablet by mouth daily.         Marland Kitchen oxyCODONE-acetaminophen (PERCOCET) 10-650 MG per tablet   Oral   Take 1 tablet by mouth every 6 (six) hours as needed for pain.  40 tablet   0   . venlafaxine (EFFEXOR) 75 MG tablet   Oral   Take 75 mg by mouth 2 (two) times daily.         Marland Kitchen. zolpidem (AMBIEN) 10 MG tablet   Oral   Take 10 mg by mouth every other day. Alternate days with the ativan.          BP 115/77  Pulse 82  Temp(Src) 99 F (37.2 C) (Oral)  Resp 20  SpO2 96%  LMP 04/04/2013 Physical Exam  Nursing note and vitals reviewed. Constitutional: She is oriented to person, place, and time. She appears well-developed and well-nourished. No distress.  HENT:  Head: Normocephalic and atraumatic.  Eyes: Conjunctivae are normal.  Cardiovascular: Normal rate.   Pulmonary/Chest: Effort normal.  Musculoskeletal:       Right foot: She exhibits tenderness. She exhibits normal range of motion, no bony tenderness, no swelling, normal capillary refill, no crepitus, no deformity and no laceration.       Feet:  Area outlined on diagram is area of discomfort with weight  bearing and palpation  Neurological: She is alert and oriented to person, place, and time.  Skin: Skin is warm.  Psychiatric: She has a normal mood and affect. Her behavior is normal.    ED Course  Procedures (including critical care time) Labs Review Labs Reviewed - No data to display Imaging Review Dg Foot Complete Right  04/18/2013   CLINICAL DATA:  Pain in the first toe and metacarpal for 3 days. No known injury.  EXAM: RIGHT FOOT COMPLETE - 3+ VIEW  COMPARISON:  None.  FINDINGS: There is no evidence of fracture or dislocation. There is no evidence of arthropathy or other focal bone abnormality. Soft tissues are unremarkable.  IMPRESSION: Negative.   Electronically Signed   By: Tiburcio PeaJonathan  Watts M.D.   On: 04/18/2013 15:51     MDM   1. Right foot sprain    Right foot sprain: xrays normal. Ace wrap and post op shoe. NSAIDs and RICE therapy. Activity as tolerated. Ortho follow up if no improvement over next 2 weeks.   Jess BartersJennifer Lee TeninoPresson, [place]PA 04/18/13 [id number]

## 2013-05-06 ENCOUNTER — Encounter (HOSPITAL_COMMUNITY): Payer: Self-pay | Admitting: Emergency Medicine

## 2013-05-06 ENCOUNTER — Emergency Department (HOSPITAL_COMMUNITY)
Admission: EM | Admit: 2013-05-06 | Discharge: 2013-05-06 | Disposition: A | Discharge status: Home / Self Care | Payer: 59 | Attending: Emergency Medicine | Admitting: Emergency Medicine

## 2013-05-06 DIAGNOSIS — Z79899 Other long term (current) drug therapy: Secondary | ICD-10-CM | POA: Insufficient documentation

## 2013-05-06 DIAGNOSIS — H53149 Visual discomfort, unspecified: Secondary | ICD-10-CM | POA: Insufficient documentation

## 2013-05-06 DIAGNOSIS — F329 Major depressive disorder, single episode, unspecified: Secondary | ICD-10-CM | POA: Insufficient documentation

## 2013-05-06 DIAGNOSIS — F3289 Other specified depressive episodes: Secondary | ICD-10-CM | POA: Insufficient documentation

## 2013-05-06 DIAGNOSIS — Z8742 Personal history of other diseases of the female genital tract: Secondary | ICD-10-CM | POA: Insufficient documentation

## 2013-05-06 DIAGNOSIS — R51 Headache: Secondary | ICD-10-CM | POA: Insufficient documentation

## 2013-05-06 DIAGNOSIS — K219 Gastro-esophageal reflux disease without esophagitis: Secondary | ICD-10-CM | POA: Insufficient documentation

## 2013-05-06 DIAGNOSIS — R110 Nausea: Secondary | ICD-10-CM | POA: Insufficient documentation

## 2013-05-06 DIAGNOSIS — R519 Headache, unspecified: Secondary | ICD-10-CM

## 2013-05-06 MED ORDER — METOCLOPRAMIDE HCL 5 MG/ML IJ SOLN
10.0000 mg | Freq: Once | INTRAMUSCULAR | Status: AC
  Administered 2013-05-06: 10 mg via INTRAMUSCULAR
  Filled 2013-05-06: qty 2

## 2013-05-06 MED ORDER — DIPHENHYDRAMINE HCL 50 MG/ML IJ SOLN
25.0000 mg | Freq: Once | INTRAMUSCULAR | Status: AC
  Administered 2013-05-06: 25 mg via INTRAMUSCULAR
  Filled 2013-05-06: qty 1

## 2013-05-06 MED ORDER — ONDANSETRON 4 MG PO TBDP
8.0000 mg | ORAL_TABLET | Freq: Once | ORAL | Status: AC
  Administered 2013-05-06: 8 mg via ORAL
  Filled 2013-05-06: qty 2

## 2013-05-06 MED ORDER — DEXAMETHASONE SODIUM PHOSPHATE 10 MG/ML IJ SOLN
10.0000 mg | Freq: Once | INTRAMUSCULAR | Status: AC
  Administered 2013-05-06: 10 mg via INTRAMUSCULAR
  Filled 2013-05-06: qty 1

## 2013-05-06 NOTE — ED Provider Notes (Signed)
CSN: [id number]     Arrival date & time 05/06/13  1806 History   First MD Initiated Contact with Patient 05/06/13 2014     Chief Complaint  Patient presents with  . Headache     (Consider location/radiation/quality/duration/timing/severity/associated sxs/prior Treatment) HPI Comments: Patient is a 28 year old female with a history of tension headaches who presents to the emergency department for a headache onset noon today. Patient states that headache began in her occipital region and has "moved" since onset. Patient states headache is currently present on the left side of her head and behind her left eye. She states that the pain is a intermittent sharp pain that was not relieved by Fioricet. Patient states symptoms have been associated with episodes of "black spots" in her vision which have been present in her b/l eyes. Vision changes last "a few minutes" before spontaneously resolving. She states that headache mildly worsens after "black spots" occur. She states headache has also been associated with photophobia and nausea. She denies associated fever, syncope, neck pain or stiffness, vision loss, tinnitus, speech difficulty, hearing loss, vomiting, gait difficulty, numbness/tingling, and weakness. Pain currently rated 5/10.  Patient is a 28 y.o. female presenting with headaches. The history is provided by the patient. No language interpreter was used.  Headache Associated symptoms: nausea and photophobia   Associated symptoms: no fever, no neck pain, no neck stiffness, no numbness and no vomiting     Past Medical History  Diagnosis Date  . Depression   . Overactive bladder   . GERD (gastroesophageal reflux disease)   . Pelvic pain   . SUI (stress urinary incontinence, female)    Past Surgical History  Procedure Laterality Date  . Cesarean section  2009  &  2011    2011 W/ BILATERAL TUBAL LIGATION  . Transthoracic echocardiogram  06/2009  . Cysto with hydrodistension  03/01/2012   Procedure: CYSTOSCOPY/HYDRODISTENSION;  Surgeon: Martina SinnerScott A MacDiarmid, MD;  [facility] [place];  Service: Urology;  Laterality: N/A;  INSTILLATION of marcaine and pyridium.  . Cholecystectomy  2010  . Pubovaginal sling N/A 02/14/2013    Procedure: PUBO-VAGINAL SLING AND HYDRODISTENSION;  Surgeon: Martina SinnerScott A MacDiarmid, MD;  [facility] ;  Service: Urology;  Laterality: N/A;   History reviewed. No pertinent family history. History  Substance Use Topics  . Smoking status: Never Smoker   . Smokeless tobacco: Never Used  . Alcohol Use: Yes     Comment: rare   OB History   Grav Para Term Preterm Abortions TAB SAB Ect Mult Living                 Review of Systems  Constitutional: Negative for fever.  Eyes: Positive for photophobia and visual disturbance.  Gastrointestinal: Positive for nausea. Negative for vomiting.  Musculoskeletal: Negative for neck pain and neck stiffness.  Neurological: Positive for headaches. Negative for syncope, weakness and numbness.  All other systems reviewed and are negative.     Allergies  Review of patient's allergies indicates no known allergies.  Home Medications   Current Outpatient Rx  Name  Route  Sig  Dispense  Refill  . amitriptyline (ELAVIL) 10 MG tablet   Oral   Take 10 mg by mouth at bedtime.         . docusate sodium (COLACE) 100 MG capsule   Oral   Take 200 mg by mouth at bedtime.         . famotidine (PEPCID) 10 MG tablet  Oral   Take 10 mg by mouth every evening.          . loratadine (CLARITIN) 10 MG tablet   Oral   Take 10 mg by mouth daily.         Marland Kitchen LORazepam (ATIVAN) 0.5 MG tablet   Oral   Take 0.5 mg by mouth as needed for sleep.          . Multiple Vitamin (MULTIVITAMIN) tablet   Oral   Take 1 tablet by mouth daily.         Marland Kitchen venlafaxine (EFFEXOR) 75 MG tablet   Oral   Take 75 mg by mouth 2 (two) times daily.         Marland Kitchen zolpidem (AMBIEN) 10 MG tablet   Oral    Take 10 mg by mouth at bedtime as needed for sleep.           BP 133/89  Pulse 87  Temp(Src) 97.9 F (36.6 C) (Oral)  Resp 18  SpO2 95%  LMP 04/04/2013  Physical Exam  Nursing note and vitals reviewed. Constitutional: She is oriented to person, place, and time. She appears well-developed and well-nourished. No distress.  HENT:  Head: Normocephalic and atraumatic.  Mouth/Throat: Oropharynx is clear and moist. No oropharyngeal exudate.  Eyes: Conjunctivae and EOM are normal. Pupils are equal, round, and reactive to light. No scleral icterus.  Pupils equal round and reactive to direct and consensual light. All visual fields intact. Visual acuity 20/15 OD, 20/20 OS and OU.  Neck: Normal range of motion. Neck supple.  Cardiovascular: Normal rate, regular rhythm, normal heart sounds and intact distal pulses.   Distal radial, dorsalis pedis, and posterior tibial pulses 2+ bilaterally.  Pulmonary/Chest: Effort normal and breath sounds normal. No respiratory distress. She has no wheezes. She has no rales.  Musculoskeletal: Normal range of motion.  Neurological: She is alert and oriented to person, place, and time. She has normal reflexes. No cranial nerve deficit. She exhibits normal muscle tone.  GCS 15. Speech is goal oriented. No focal neurologic deficits appreciated; symmetric eyebrow raise, no facial drooping, equal tongue protrusion. Patient has normal and equal grip strength bilaterally. 5/5 strength against resistance in all extremities. No gross sensory deficits appreciated. Patient ambulates with normal gait.  Skin: Skin is warm and dry. No rash noted. She is not diaphoretic. No erythema. No pallor.  Psychiatric: She has a normal mood and affect. Her behavior is normal.    ED Course  Procedures (including critical care time) Labs Review Labs Reviewed - No data to display  Imaging Review No results found.   EKG Interpretation None      MDM   Final diagnoses:  Headache     28 year old female presents for headache with associated "black spots" in her vision bilaterally x 2 lasting "minutes". She denies complete vision loss, dizziness, weakness/numbness, and syncope. Patient well and nontoxic appearing, hemodynamically stable, and afebrile. She is neurovascularly intact. No focal neurologic deficits appreciated on exam. Visual acuity and all visual fields intact. Patient denies any head injury or trauma inciting symptoms. No neck pain or stiffness associated with headache. Patient treated in ED with migraine cocktail with complete resolution of headache. Symptoms today consistent with atypical migraine headache. Doubt emergent intracranial process as cause of symptoms given stable neurologic examination over ED course. No nuchal rigidity or meningismus to suggest meningitis. Patient stable and appropriate for d/c with instruction for PCP follow up. Return precautions discussed and patient agreeable to  plan with no unaddressed concerns.   Filed Vitals:   05/06/13 1825  BP: 133/89  Pulse: 87  Temp: 97.9 F (36.6 C)  TempSrc: Oral  Resp: 18  SpO2: 95%       Antony Madura, PA-C 05/06/13 2243

## 2013-05-06 NOTE — ED Notes (Addendum)
Pt reports "raging right side headache" that started today, had an episode "of seeing black spots today." vision is fine at this time but still has headache, reports similar episode earlier in the week. Hx of tension migraines. Reports nausea, no vomiting.

## 2013-05-06 NOTE — Discharge Instructions (Signed)

## 2013-05-06 NOTE — ED Notes (Signed)
Pt wore glasses during visual acuity examination

## 2013-05-07 NOTE — ED Provider Notes (Signed)
Medical screening examination/treatment/procedure(s) were performed by non-physician practitioner and as supervising physician I was immediately available for consultation/collaboration.   Nelia Shiobert L Ariyon Gerstenberger, MD 05/07/13 1520

## 2013-05-16 ENCOUNTER — Other Ambulatory Visit (HOSPITAL_COMMUNITY): Payer: Self-pay | Admitting: Family Medicine

## 2013-05-16 DIAGNOSIS — R51 Headache: Secondary | ICD-10-CM

## 2013-05-23 ENCOUNTER — Ambulatory Visit (HOSPITAL_COMMUNITY): Admission: RE | Admit: 2013-05-23 | Payer: 59 | Source: Ambulatory Visit

## 2013-05-30 ENCOUNTER — Ambulatory Visit (HOSPITAL_COMMUNITY)
Admission: RE | Admit: 2013-05-30 | Discharge: 2013-05-30 | Disposition: A | Discharge status: Home / Self Care | Payer: 59 | Source: Ambulatory Visit | Attending: Family Medicine | Admitting: Family Medicine

## 2013-05-30 DIAGNOSIS — R110 Nausea: Secondary | ICD-10-CM | POA: Insufficient documentation

## 2013-05-30 DIAGNOSIS — R51 Headache: Secondary | ICD-10-CM | POA: Insufficient documentation

## 2013-05-30 DIAGNOSIS — H5710 Ocular pain, unspecified eye: Secondary | ICD-10-CM | POA: Insufficient documentation

## 2013-05-30 DIAGNOSIS — R259 Unspecified abnormal involuntary movements: Secondary | ICD-10-CM | POA: Insufficient documentation

## 2014-02-23 ENCOUNTER — Encounter (HOSPITAL_COMMUNITY): Payer: Self-pay | Admitting: *Deleted

## 2014-02-23 DIAGNOSIS — F329 Major depressive disorder, single episode, unspecified: Secondary | ICD-10-CM | POA: Insufficient documentation

## 2014-02-23 DIAGNOSIS — Z9849 Cataract extraction status, unspecified eye: Secondary | ICD-10-CM | POA: Diagnosis not present

## 2014-02-23 DIAGNOSIS — K219 Gastro-esophageal reflux disease without esophagitis: Secondary | ICD-10-CM | POA: Insufficient documentation

## 2014-02-23 DIAGNOSIS — Z9889 Other specified postprocedural states: Secondary | ICD-10-CM | POA: Diagnosis not present

## 2014-02-23 DIAGNOSIS — Z3202 Encounter for pregnancy test, result negative: Secondary | ICD-10-CM | POA: Insufficient documentation

## 2014-02-23 DIAGNOSIS — Z87448 Personal history of other diseases of urinary system: Secondary | ICD-10-CM | POA: Diagnosis not present

## 2014-02-23 DIAGNOSIS — Z8719 Personal history of other diseases of the digestive system: Secondary | ICD-10-CM | POA: Insufficient documentation

## 2014-02-23 DIAGNOSIS — R1031 Right lower quadrant pain: Secondary | ICD-10-CM | POA: Diagnosis present

## 2014-02-23 LAB — URINALYSIS, ROUTINE W REFLEX MICROSCOPIC
Bilirubin Urine: NEGATIVE
Glucose, UA: NEGATIVE mg/dL
Ketones, ur: NEGATIVE mg/dL
Leukocytes, UA: NEGATIVE
Nitrite: NEGATIVE
Protein, ur: NEGATIVE mg/dL
Specific Gravity, Urine: 1.008 (ref 1.005–1.030)
Urobilinogen, UA: 0.2 mg/dL (ref 0.0–1.0)
pH: 6.5 (ref 5.0–8.0)

## 2014-02-23 LAB — CBC WITH DIFFERENTIAL/PLATELET
Basophils Absolute: 0 10*3/uL (ref 0.0–0.1)
Basophils Relative: 1 % (ref 0–1)
Eosinophils Absolute: 0.1 10*3/uL (ref 0.0–0.7)
Eosinophils Relative: 2 % (ref 0–5)
HCT: 40.7 % (ref 36.0–46.0)
Hemoglobin: 13.7 g/dL (ref 12.0–15.0)
Lymphocytes Relative: 36 % (ref 12–46)
Lymphs Abs: 3 10*3/uL (ref 0.7–4.0)
MCH: 31.8 pg (ref 26.0–34.0)
MCHC: 33.7 g/dL (ref 30.0–36.0)
MCV: 94.4 fL (ref 78.0–100.0)
Monocytes Absolute: 0.3 10*3/uL (ref 0.1–1.0)
Monocytes Relative: 4 % (ref 3–12)
Neutro Abs: 4.7 10*3/uL (ref 1.7–7.7)
Neutrophils Relative %: 57 % (ref 43–77)
Platelets: 324 10*3/uL (ref 150–400)
RBC: 4.31 MIL/uL (ref 3.87–5.11)
RDW: 13.4 % (ref 11.5–15.5)
WBC: 8.1 10*3/uL (ref 4.0–10.5)

## 2014-02-23 LAB — URINE MICROSCOPIC-ADD ON

## 2014-02-23 LAB — LIPASE, BLOOD: Lipase: 27 U/L (ref 11–59)

## 2014-02-23 NOTE — ED Notes (Signed)
Pt in c/o RLQ abd pain for the last few days, symptoms have been getting progressively worse with body aches and chills, nausea, unsure of fever at home, went to an urgent care to check for UTI and that was negative, sent here for CT

## 2014-02-24 ENCOUNTER — Emergency Department (HOSPITAL_COMMUNITY)
Admission: EM | Admit: 2014-02-24 | Discharge: 2014-02-24 | Disposition: A | Discharge status: Home / Self Care | Payer: BLUE CROSS/BLUE SHIELD | Attending: Emergency Medicine | Admitting: Emergency Medicine

## 2014-02-24 ENCOUNTER — Encounter (HOSPITAL_COMMUNITY): Payer: Self-pay | Admitting: Radiology

## 2014-02-24 ENCOUNTER — Emergency Department (HOSPITAL_COMMUNITY): Payer: BLUE CROSS/BLUE SHIELD

## 2014-02-24 DIAGNOSIS — R1031 Right lower quadrant pain: Secondary | ICD-10-CM

## 2014-02-24 LAB — COMPREHENSIVE METABOLIC PANEL
ALT: 24 U/L (ref 0–35)
AST: 22 U/L (ref 0–37)
Albumin: 3.6 g/dL (ref 3.5–5.2)
Alkaline Phosphatase: 58 U/L (ref 39–117)
Anion gap: 7 (ref 5–15)
BUN: 5 mg/dL — ABNORMAL LOW (ref 6–23)
CO2: 28 mmol/L (ref 19–32)
Calcium: 8.9 mg/dL (ref 8.4–10.5)
Chloride: 103 mmol/L (ref 96–112)
Creatinine, Ser: 0.7 mg/dL (ref 0.50–1.10)
GFR calc Af Amer: >90 mL/min (ref >90)
GFR calc non Af Amer: >90 mL/min (ref >90)
Glucose, Bld: 91 mg/dL (ref 70–99)
Potassium: 3.8 mmol/L (ref 3.5–5.1)
Sodium: 138 mmol/L (ref 135–145)
Total Bilirubin: 0.3 mg/dL (ref 0.3–1.2)
Total Protein: 6.9 g/dL (ref 6.0–8.3)

## 2014-02-24 MED ORDER — ONDANSETRON HCL 4 MG/2ML IJ SOLN
4.0000 mg | Freq: Once | INTRAMUSCULAR | Status: AC
  Administered 2014-02-24: 4 mg via INTRAVENOUS
  Filled 2014-02-24: qty 2

## 2014-02-24 MED ORDER — PROMETHAZINE HCL 25 MG/ML IJ SOLN
12.5000 mg | Freq: Once | INTRAMUSCULAR | Status: AC
  Administered 2014-02-24: 12.5 mg via INTRAVENOUS
  Filled 2014-02-24: qty 1

## 2014-02-24 MED ORDER — IOHEXOL 300 MG/ML  SOLN
100.0000 mL | Freq: Once | INTRAMUSCULAR | Status: AC | PRN
  Administered 2014-02-24: 100 mL via INTRAVENOUS

## 2014-02-24 MED ORDER — SODIUM CHLORIDE 0.9 % IV SOLN
Freq: Once | INTRAVENOUS | Status: AC
  Administered 2014-02-24: 01:00:00 via INTRAVENOUS

## 2014-02-24 MED ORDER — ONDANSETRON HCL 4 MG PO TABS: 4.0000 mg | ORAL_TABLET | Freq: Four times a day (QID) | ORAL | Status: DC

## 2014-02-24 MED ORDER — HYDROCODONE-ACETAMINOPHEN 5-325 MG PO TABS: 1.0000 | ORAL_TABLET | ORAL | Status: DC | PRN

## 2014-02-24 MED ORDER — IOHEXOL 300 MG/ML  SOLN
25.0000 mL | Freq: Once | INTRAMUSCULAR | Status: AC | PRN
  Administered 2014-02-24: 25 mL via ORAL

## 2014-02-24 MED ORDER — MORPHINE SULFATE 4 MG/ML IJ SOLN
4.0000 mg | Freq: Once | INTRAMUSCULAR | Status: AC
Start: 2014-02-24 — End: 2014-02-24
  Administered 2014-02-24: 4 mg via INTRAVENOUS
  Filled 2014-02-24: qty 1

## 2014-02-24 NOTE — ED Notes (Signed)
Pt finished drinking contrast. CT notified. 

## 2014-02-24 NOTE — ED Provider Notes (Signed)
CSN: [id number]     Arrival date & time 02/23/14  2223 History   First MD Initiated Contact with Patient 02/24/14 0018     Chief Complaint  Patient presents with  . Abdominal Pain     (Consider location/radiation/quality/duration/timing/severity/associated sxs/prior Treatment) Patient is a 29 y.o. female presenting with abdominal pain. The history is provided by the patient. No language interpreter was used.  Abdominal Pain Pain location:  RLQ Associated symptoms: nausea   Associated symptoms: no chills, no diarrhea, no dysuria, no fever, no vaginal discharge and no vomiting   Associated symptoms comment:  She started having mid-lower abdominal pain 3 days ago and thought it was recurrent "bladder spasms" as she has had a history urologic problems, followed by urology. She has had nausea without vomiting. No diarrhea. Over the last 3 days the pain has moved to the right lower quadrant abdomen. No dysuria. She reports a fever that started yesterday.    Past Medical History  Diagnosis Date  . Depression   . Overactive bladder   . GERD (gastroesophageal reflux disease)   . Pelvic pain   . SUI (stress urinary incontinence, female)    Past Surgical History  Procedure Laterality Date  . Cesarean section  2009  &  2011    2011 W/ BILATERAL TUBAL LIGATION  . Transthoracic echocardiogram  06/2009  . Cysto with hydrodistension  03/01/2012    Procedure: CYSTOSCOPY/HYDRODISTENSION;  Surgeon: Martina SinnerScott A MacDiarmid, MD;  [facility] [place];  Service: Urology;  Laterality: N/A;  INSTILLATION of marcaine and pyridium.  . Cholecystectomy  2010  . Pubovaginal sling N/A 02/14/2013    Procedure: PUBO-VAGINAL SLING AND HYDRODISTENSION;  Surgeon: Martina SinnerScott A MacDiarmid, MD;  [facility] [place];  Service: Urology;  Laterality: N/A;   History reviewed. No pertinent family history. History  Substance Use Topics  . Smoking status: Never Smoker   . Smokeless tobacco: Never Used   . Alcohol Use: Yes     Comment: rare   OB History    No data available     Review of Systems  Constitutional: Negative for fever and chills.  Respiratory: Negative.   Cardiovascular: Negative.   Gastrointestinal: Positive for nausea and abdominal pain. Negative for vomiting and diarrhea.  Genitourinary: Negative.  Negative for dysuria and vaginal discharge.  Musculoskeletal: Negative.   Skin: Negative.   Neurological: Negative.       Allergies  Review of patient's allergies indicates no known allergies.  Home Medications   Prior to Admission medications   Medication Sig Start Date End Date Taking? Authorizing Provider  amitriptyline (ELAVIL) 10 MG tablet Take 10 mg by mouth at bedtime.    Historical Provider, MD  docusate sodium (COLACE) 100 MG capsule Take 200 mg by mouth at bedtime.    Historical Provider, MD  famotidine (PEPCID) 10 MG tablet Take 10 mg by mouth every evening.     Historical Provider, MD  loratadine (CLARITIN) 10 MG tablet Take 10 mg by mouth daily.    Historical Provider, MD  LORazepam (ATIVAN) 0.5 MG tablet Take 0.5 mg by mouth as needed for sleep.     Historical Provider, MD  Multiple Vitamin (MULTIVITAMIN) tablet Take 1 tablet by mouth daily.    Historical Provider, MD  venlafaxine (EFFEXOR) 75 MG tablet Take 75 mg by mouth 2 (two) times daily.    Historical Provider, MD  zolpidem (AMBIEN) 10 MG tablet Take 10 mg by mouth at bedtime as needed for sleep.  Historical Provider, MD   BP 124/83 mmHg  Pulse 101  Temp(Src) 98 F (36.7 C) (Oral)  Resp 18  SpO2 94% Physical Exam  Constitutional: She is oriented to person, place, and time. She appears well-developed and well-nourished.  Neck: Normal range of motion.  Pulmonary/Chest: Effort normal.  Abdominal: Soft.  Morbidly obese abdomen. RLQ tenderness with mild guarding.   Neurological: She is alert and oriented to person, place, and time.  Skin: Skin is warm and dry.    ED Course  Procedures  (including critical care time) Labs Review Labs Reviewed  COMPREHENSIVE METABOLIC PANEL - Abnormal; Notable for the following:    BUN 5 (*)    All other components within normal limits  URINALYSIS, ROUTINE W REFLEX MICROSCOPIC - Abnormal; Notable for the following:    Hgb urine dipstick SMALL (*)    All other components within normal limits  URINE MICROSCOPIC-ADD ON - Abnormal; Notable for the following:    Bacteria, UA FEW (*)    All other components within normal limits  CBC WITH DIFFERENTIAL/PLATELET  LIPASE, BLOOD  POC URINE PREG, ED    Imaging Review No results found.   EKG Interpretation None      MDM   Final diagnoses:  None    1. RLQ Abdominal pain  CT negative, labs stable. Patient's pain is controlled. She is felt appropriate for discharge home. Return precautions discussed.     Arnoldo Hooker, PA-C 03/02/14 0418  Dione Booze, MD 03/03/14 [phone]

## 2014-02-24 NOTE — ED Notes (Signed)
Patient is alert and orientedx4.  Patient was explained discharge instructions and they understood them with no questions.  The paient's husband, [name] is taking the patient home.

## 2014-02-24 NOTE — Discharge Instructions (Signed)

## 2014-02-24 NOTE — ED Notes (Signed)
Family at bedside. 

## 2014-04-23 ENCOUNTER — Encounter (HOSPITAL_COMMUNITY): Payer: Self-pay | Admitting: Emergency Medicine

## 2014-04-23 ENCOUNTER — Emergency Department (HOSPITAL_COMMUNITY)
Admission: EM | Admit: 2014-04-23 | Discharge: 2014-04-24 | Disposition: A | Discharge status: Home / Self Care | Payer: BLUE CROSS/BLUE SHIELD | Attending: Emergency Medicine | Admitting: Emergency Medicine

## 2014-04-23 DIAGNOSIS — K219 Gastro-esophageal reflux disease without esophagitis: Secondary | ICD-10-CM | POA: Insufficient documentation

## 2014-04-23 DIAGNOSIS — Z87448 Personal history of other diseases of urinary system: Secondary | ICD-10-CM | POA: Insufficient documentation

## 2014-04-23 DIAGNOSIS — R000 Tachycardia, unspecified: Secondary | ICD-10-CM | POA: Diagnosis not present

## 2014-04-23 DIAGNOSIS — F329 Major depressive disorder, single episode, unspecified: Secondary | ICD-10-CM | POA: Insufficient documentation

## 2014-04-23 DIAGNOSIS — R509 Fever, unspecified: Secondary | ICD-10-CM

## 2014-04-23 DIAGNOSIS — E860 Dehydration: Secondary | ICD-10-CM | POA: Diagnosis not present

## 2014-04-23 DIAGNOSIS — J020 Streptococcal pharyngitis: Secondary | ICD-10-CM | POA: Diagnosis not present

## 2014-04-23 DIAGNOSIS — Z79899 Other long term (current) drug therapy: Secondary | ICD-10-CM | POA: Insufficient documentation

## 2014-04-23 DIAGNOSIS — M791 Myalgia: Secondary | ICD-10-CM | POA: Diagnosis present

## 2014-04-23 LAB — CBC WITH DIFFERENTIAL/PLATELET
Basophils Absolute: 0 10*3/uL (ref 0.0–0.1)
Basophils Relative: 0 % (ref 0–1)
Eosinophils Absolute: 0 10*3/uL (ref 0.0–0.7)
Eosinophils Relative: 0 % (ref 0–5)
HCT: 39.9 % (ref 36.0–46.0)
Hemoglobin: 13.8 g/dL (ref 12.0–15.0)
Lymphocytes Relative: 7 % — ABNORMAL LOW (ref 12–46)
Lymphs Abs: 1.3 10*3/uL (ref 0.7–4.0)
MCH: 32.5 pg (ref 26.0–34.0)
MCHC: 34.6 g/dL (ref 30.0–36.0)
MCV: 93.9 fL (ref 78.0–100.0)
Monocytes Absolute: 1.1 10*3/uL — ABNORMAL HIGH (ref 0.1–1.0)
Monocytes Relative: 6 % (ref 3–12)
Neutro Abs: 16.2 10*3/uL — ABNORMAL HIGH (ref 1.7–7.7)
Neutrophils Relative %: 87 % — ABNORMAL HIGH (ref 43–77)
Platelets: 311 10*3/uL (ref 150–400)
RBC: 4.25 MIL/uL (ref 3.87–5.11)
RDW: 13.2 % (ref 11.5–15.5)
WBC: 18.7 10*3/uL — ABNORMAL HIGH (ref 4.0–10.5)

## 2014-04-23 LAB — RAPID STREP SCREEN (MED CTR MEBANE ONLY): Streptococcus, Group A Screen (Direct): POSITIVE — AB

## 2014-04-23 LAB — I-STAT CHEM 8, ED
BUN: 5 mg/dL — ABNORMAL LOW (ref 6–23)
Calcium, Ion: 1.05 mmol/L — ABNORMAL LOW (ref 1.12–1.23)
Chloride: 100 mmol/L (ref 96–112)
Creatinine, Ser: 0.8 mg/dL (ref 0.50–1.10)
Glucose, Bld: 112 mg/dL — ABNORMAL HIGH (ref 70–99)
HCT: 44 % (ref 36.0–46.0)
Hemoglobin: 15 g/dL (ref 12.0–15.0)
Potassium: 3.7 mmol/L (ref 3.5–5.1)
Sodium: 136 mmol/L (ref 135–145)
TCO2: 19 mmol/L (ref 0–100)

## 2014-04-23 MED ORDER — ACETAMINOPHEN 325 MG PO TABS
650.0000 mg | ORAL_TABLET | Freq: Once | ORAL | Status: AC
  Administered 2014-04-23: 650 mg via ORAL

## 2014-04-23 MED ORDER — SODIUM CHLORIDE 0.9 % IV BOLUS (SEPSIS)
1000.0000 mL | Freq: Once | INTRAVENOUS | Status: AC
  Administered 2014-04-23: 1000 mL via INTRAVENOUS

## 2014-04-23 MED ORDER — ACETAMINOPHEN 325 MG PO TABS
ORAL_TABLET | ORAL | Status: AC
  Filled 2014-04-23: qty 2

## 2014-04-23 MED ORDER — DEXAMETHASONE SODIUM PHOSPHATE 10 MG/ML IJ SOLN
10.0000 mg | Freq: Once | INTRAMUSCULAR | Status: AC
  Administered 2014-04-23: 10 mg via INTRAVENOUS
  Filled 2014-04-23: qty 1

## 2014-04-23 MED ORDER — PENICILLIN G BENZATHINE 1200000 UNIT/2ML IM SUSP
1.2000 10*6.[IU] | Freq: Once | INTRAMUSCULAR | Status: AC
  Administered 2014-04-23: 1.2 10*6.[IU] via INTRAMUSCULAR
  Filled 2014-04-23: qty 2

## 2014-04-23 NOTE — ED Notes (Signed)
MD at bedside. 

## 2014-04-23 NOTE — ED Notes (Signed)
Pt c/o generalized body aches, elevated temp and chest pain.  Onset this am.  Pt st's she took tylenol at 9:00am but didn't help.  Pt also st's her daughter has strep

## 2014-04-23 NOTE — ED Provider Notes (Addendum)
CSN: [id number]     Arrival date & time 04/23/14  1729 History   First MD Initiated Contact with Patient 04/23/14 2028     Chief Complaint  Patient presents with  . Generalized Body Aches     (Consider location/radiation/quality/duration/timing/severity/associated sxs/prior Treatment) Patient is a 29 y.o. female presenting with pharyngitis.  Sore Throat This is a new problem. The current episode started 12 to 24 hours ago. The problem occurs constantly. The problem has not changed since onset.Pertinent negatives include no chest pain, no abdominal pain, no headaches and no shortness of breath. The symptoms are aggravated by swallowing. Nothing relieves the symptoms.    Past Medical History  Diagnosis Date  . Depression   . Overactive bladder   . GERD (gastroesophageal reflux disease)   . Pelvic pain   . SUI (stress urinary incontinence, female)    Past Surgical History  Procedure Laterality Date  . Cesarean section  2009  &  2011    2011 W/ BILATERAL TUBAL LIGATION  . Transthoracic echocardiogram  06/2009  . Cysto with hydrodistension  03/01/2012    Procedure: CYSTOSCOPY/HYDRODISTENSION;  Surgeon: Martina SinnerScott A MacDiarmid, MD;  [facility] [place];  Service: Urology;  Laterality: N/A;  INSTILLATION of marcaine and pyridium.  . Cholecystectomy  2010  . Pubovaginal sling N/A 02/14/2013    Procedure: PUBO-VAGINAL SLING AND HYDRODISTENSION;  Surgeon: Martina SinnerScott A MacDiarmid, MD;  [facility] [place];  Service: Urology;  Laterality: N/A;   No family history on file. History  Substance Use Topics  . Smoking status: Never Smoker   . Smokeless tobacco: Never Used  . Alcohol Use: Yes     Comment: rare   OB History    No data available     Review of Systems  Respiratory: Negative for shortness of breath.   Cardiovascular: Negative for chest pain.  Gastrointestinal: Negative for abdominal pain.  Neurological: Negative for headaches.  All other systems  reviewed and are negative.     Allergies  Review of patient's allergies indicates no known allergies.  Home Medications   Prior to Admission medications   Medication Sig Start Date End Date Taking? Authorizing Provider  acetaminophen (TYLENOL) 500 MG tablet Take 500 mg by mouth every 6 (six) hours as needed.   Yes Historical Provider, MD  amitriptyline (ELAVIL) 25 MG tablet Take 50 mg by mouth at bedtime.    Yes Historical Provider, MD  butalbital-acetaminophen-caffeine (FIORICET, ESGIC) 50-325-40 MG per tablet Take 1 tablet by mouth 2 (two) times daily as needed for headache.   Yes Historical Provider, MD  cetirizine (ZYRTEC) 10 MG tablet Take 10 mg by mouth at bedtime.   Yes Historical Provider, MD  docusate sodium (COLACE) 100 MG capsule Take 200 mg by mouth at bedtime.   Yes Historical Provider, MD  eletriptan (RELPAX) 40 MG tablet Take 40 mg by mouth as needed for migraine or headache. One tablet by mouth at onset of headache. May repeat in 2 hours if headache persists or recurs.   Yes Historical Provider, MD  famotidine (PEPCID) 10 MG tablet Take 10 mg by mouth every evening.    Yes Historical Provider, MD  LORazepam (ATIVAN) 0.5 MG tablet Take 1 mg by mouth at bedtime.    Yes Historical Provider, MD  Multiple Vitamin (MULTIVITAMIN) tablet Take 1 tablet by mouth daily.   Yes Historical Provider, MD  venlafaxine XR (EFFEXOR-XR) 150 MG 24 hr capsule Take 150 mg by mouth daily with breakfast.  Yes Historical Provider, MD  zolpidem (AMBIEN) 10 MG tablet Take 10 mg by mouth at bedtime.    Yes Historical Provider, MD  HYDROcodone-acetaminophen (NORCO/VICODIN) 5-325 MG per tablet Take 1-2 tablets by mouth every 4 (four) hours as needed. 02/24/14   Shari Upstill, PA-C  ondansetron (ZOFRAN) 4 MG tablet Take 1 tablet (4 mg total) by mouth every 6 (six) hours. 02/24/14   Elpidio Anis, PA-C  venlafaxine (EFFEXOR) 75 MG tablet Take 75 mg by mouth 2 (two) times daily.    Historical Provider, MD    BP 109/60 mmHg  Pulse 110  Temp(Src) 99.4 F (37.4 C) (Oral)  Resp 18  Ht  (1.626 m)  SpO2 98%  LMP 04/23/2014 Physical Exam  Constitutional: She is oriented to person, place, and time. She appears well-developed and well-nourished.  HENT:  Head: Normocephalic and atraumatic.  Right Ear: External ear normal.  Left Ear: External ear normal.  bil tonsilar enlargement with exudate, no asymmetry  Eyes: Conjunctivae and EOM are normal. Pupils are equal, round, and reactive to light.  Neck: Normal range of motion. Neck supple.  Cardiovascular: Normal rate, regular rhythm, normal heart sounds and intact distal pulses.   Pulmonary/Chest: Effort normal and breath sounds normal.  Abdominal: Soft. Bowel sounds are normal. There is no tenderness.  Musculoskeletal: Normal range of motion.  Neurological: She is alert and oriented to person, place, and time.  Skin: Skin is warm and dry.  Vitals reviewed.   ED Course  Procedures (including critical care time) Labs Review Labs Reviewed  RAPID STREP SCREEN - Abnormal; Notable for the following:    Streptococcus, Group A Screen (Direct) POSITIVE (*)    All other components within normal limits  CBC WITH DIFFERENTIAL/PLATELET - Abnormal; Notable for the following:    WBC 18.7 (*)    Neutrophils Relative % 87 (*)    Neutro Abs 16.2 (*)    Lymphocytes Relative 7 (*)    Monocytes Absolute 1.1 (*)    All other components within normal limits  I-STAT CHEM 8, ED - Abnormal; Notable for the following:    BUN 5 (*)    Glucose, Bld 112 (*)    Calcium, Ion 1.05 (*)    All other components within normal limits    Imaging Review No results found.   EKG Interpretation   Date/Time:  Monday April 23 2014 18:02:01 EDT Ventricular Rate:  151 PR Interval:  132 QRS Duration: 80 QT Interval:  258 QTC Calculation: 408 R Axis:   40 Text Interpretation:  Sinus tachycardia Cannot rule out Anterior infarct ,  age undetermined Abnormal ECG  SINCE LAST TRACING HEART RATE HAS INCREASED  Confirmed by Mirian Mo [id number]) on 04/23/2014 9:21:14 PM      MDM   Final diagnoses:  Tachycardia  Dehydration  Fever, unspecified fever cause  Strep pharyngitis    29 y.o. female with pertinent PMH of depression presents with sore throat.  Exam as above.  Pt tachycardic, with + strep screen.  NS bolus.  Given bicillin and decadron.  HR improved after 2L to 110 during my examination.  Pt states she feels better and would like to return home.  Doubt RPA or PTA given current exam, and discussed possibility of these developing specifically with the pt and gave her strict return precautions.  She voiced understanding and agreed to fu.  I have reviewed all laboratory and imaging studies if ordered as above  1. Strep pharyngitis   2. Tachycardia  3. Dehydration   4. Fever, unspecified fever cause         Mirian Mo, MD 04/24/14 1610  Mirian Mo, MD 04/24/14 [phone]

## 2014-04-24 NOTE — Discharge Instructions (Signed)

## 2014-11-14 ENCOUNTER — Other Ambulatory Visit: Payer: Self-pay | Admitting: Family Medicine

## 2014-11-14 ENCOUNTER — Other Ambulatory Visit (HOSPITAL_COMMUNITY)
Admission: RE | Admit: 2014-11-14 | Discharge: 2014-11-14 | Disposition: A | Discharge status: Home / Self Care | Payer: BLUE CROSS/BLUE SHIELD | Source: Ambulatory Visit | Attending: Family Medicine | Admitting: Family Medicine

## 2014-11-14 DIAGNOSIS — Z01419 Encounter for gynecological examination (general) (routine) without abnormal findings: Secondary | ICD-10-CM | POA: Diagnosis present

## 2014-11-15 LAB — CYTOLOGY - PAP

## 2015-02-18 ENCOUNTER — Emergency Department (INDEPENDENT_AMBULATORY_CARE_PROVIDER_SITE_OTHER)
Admission: EM | Admit: 2015-02-18 | Discharge: 2015-02-18 | Disposition: A | Discharge status: Home / Self Care | Payer: BLUE CROSS/BLUE SHIELD | Source: Home / Self Care | Attending: Family Medicine | Admitting: Family Medicine

## 2015-02-18 ENCOUNTER — Encounter (HOSPITAL_COMMUNITY): Payer: Self-pay | Admitting: *Deleted

## 2015-02-18 DIAGNOSIS — J020 Streptococcal pharyngitis: Secondary | ICD-10-CM | POA: Diagnosis not present

## 2015-02-18 MED ORDER — AMOXICILLIN 500 MG PO CAPS: 500.0000 mg | ORAL_CAPSULE | Freq: Three times a day (TID) | ORAL | Status: DC

## 2015-02-18 NOTE — ED Notes (Signed)
Pt  Reports  Symptoms  Of   sorethroat   Today  With  Pain  When  She  Swallows

## 2015-02-18 NOTE — Discharge Instructions (Signed)
Drink lots of fluids, take all of medicine, use lozenges as needed.return if needed °

## 2015-02-18 NOTE — ED Provider Notes (Signed)
CSN: [id number]     Arrival date & time 02/18/15  1940 History   First MD Initiated Contact with Patient 02/18/15 2051     Chief Complaint  Patient presents with  . Sore Throat   (Consider location/radiation/quality/duration/timing/severity/associated sxs/prior Treatment) Patient is a 30 y.o. female presenting with pharyngitis. The history is provided by the patient.  Sore Throat This is a new problem. The current episode started yesterday. The problem has been gradually worsening. The symptoms are aggravated by swallowing.    Past Medical History  Diagnosis Date  . Depression   . Overactive bladder   . GERD (gastroesophageal reflux disease)   . Pelvic pain   . SUI (stress urinary incontinence, female)    Past Surgical History  Procedure Laterality Date  . Cesarean section  2009  &  2011    2011 W/ BILATERAL TUBAL LIGATION  . Transthoracic echocardiogram  06/2009  . Cysto with hydrodistension  03/01/2012    Procedure: CYSTOSCOPY/HYDRODISTENSION;  Surgeon: Martina Sinner, MD;  [facility];  Service: Urology;  Laterality: N/A;  INSTILLATION of marcaine and pyridium.  . Cholecystectomy  2010  . Pubovaginal sling N/A 02/14/2013    Procedure: PUBO-VAGINAL SLING AND HYDRODISTENSION;  Surgeon: Martina Sinner, MD;  [facility];  Service: Urology;  Laterality: N/A;   History reviewed. No pertinent family history. Social History  Substance Use Topics  . Smoking status: Never Smoker   . Smokeless tobacco: Never Used  . Alcohol Use: Yes     Comment: rare   OB History    No data available     Review of Systems  Constitutional: Positive for fever and chills.  HENT: Positive for sore throat and trouble swallowing.   Hematological: Positive for adenopathy.    Allergies  Review of patient's allergies indicates no known allergies.  Home Medications   Prior to Admission medications   Medication Sig Start Date End Date Taking?  Authorizing Provider  acetaminophen (TYLENOL) 500 MG tablet Take 500 mg by mouth every 6 (six) hours as needed.    Historical Provider, MD  amitriptyline (ELAVIL) 25 MG tablet Take 50 mg by mouth at bedtime.     Historical Provider, MD  amoxicillin (AMOXIL) 500 MG capsule Take 1 capsule (500 mg total) by mouth 3 (three) times daily. 02/18/15   Linna Hoff, MD  butalbital-acetaminophen-caffeine (FIORICET, ESGIC) [phone] MG per tablet Take 1 tablet by mouth 2 (two) times daily as needed for headache.    Historical Provider, MD  cetirizine (ZYRTEC) 10 MG tablet Take 10 mg by mouth at bedtime.    Historical Provider, MD  docusate sodium (COLACE) 100 MG capsule Take 200 mg by mouth at bedtime.    Historical Provider, MD  eletriptan (RELPAX) 40 MG tablet Take 40 mg by mouth as needed for migraine or headache. One tablet by mouth at onset of headache. May repeat in 2 hours if headache persists or recurs.    Historical Provider, MD  famotidine (PEPCID) 10 MG tablet Take 10 mg by mouth every evening.     Historical Provider, MD  HYDROcodone-acetaminophen (NORCO/VICODIN) 5-325 MG per tablet Take 1-2 tablets by mouth every 4 (four) hours as needed. 02/24/14   Elpidio Anis, PA-C  LORazepam (ATIVAN) 0.5 MG tablet Take 1 mg by mouth at bedtime.     Historical Provider, MD  Multiple Vitamin (MULTIVITAMIN) tablet Take 1 tablet by mouth daily.    Historical Provider, MD  ondansetron [facility]) 4  MG tablet Take 1 tablet (4 mg total) by mouth every 6 (six) hours. 02/24/14   Elpidio Anis, PA-C  venlafaxine (EFFEXOR) 75 MG tablet Take 75 mg by mouth 2 (two) times daily.    Historical Provider, MD  venlafaxine XR (EFFEXOR-XR) 150 MG 24 hr capsule Take 150 mg by mouth daily with breakfast.    Historical Provider, MD  zolpidem (AMBIEN) 10 MG tablet Take 10 mg by mouth at bedtime.     Historical Provider, MD   Meds Ordered and Administered this Visit  Medications - No data to display  BP 157/94 mmHg  Pulse 123   Temp(Src) 100.1 F (37.8 C) (Oral)  Resp 16  SpO2 98%  LMP  (Within Months) No data found.   Physical Exam  Constitutional: She is oriented to person, place, and time. She appears well-developed and well-nourished. No distress.  HENT:  Right Ear: External ear normal.  Left Ear: External ear normal.  Mouth/Throat: Uvula is midline and mucous membranes are normal. Oropharyngeal exudate, posterior oropharyngeal edema and posterior oropharyngeal erythema present. No tonsillar abscesses.  Neck: Normal range of motion. Neck supple.  Lymphadenopathy:    She has cervical adenopathy.  Neurological: She is alert and oriented to person, place, and time.  Skin: Skin is warm and dry.  Nursing note and vitals reviewed.   ED Course  Procedures (including critical care time)  Labs Review Labs Reviewed - No data to display  Imaging Review No results found.   Visual Acuity Review  Right Eye Distance:   Left Eye Distance:   Bilateral Distance:    Right Eye Near:   Left Eye Near:    Bilateral Near:         MDM   1. Streptococcal sore throat    Meds ordered this encounter  Medications  . amoxicillin (AMOXIL) 500 MG capsule    Sig: Take 1 capsule (500 mg total) by mouth 3 (three) times daily.    Dispense:  30 capsule    Refill:  0       Linna Hoff, MD 02/18/15 2101

## 2015-05-15 DIAGNOSIS — F411 Generalized anxiety disorder: Secondary | ICD-10-CM | POA: Diagnosis not present

## 2015-05-15 DIAGNOSIS — G4700 Insomnia, unspecified: Secondary | ICD-10-CM | POA: Diagnosis not present

## 2015-05-15 DIAGNOSIS — J209 Acute bronchitis, unspecified: Secondary | ICD-10-CM | POA: Diagnosis not present

## 2015-05-15 DIAGNOSIS — G43909 Migraine, unspecified, not intractable, without status migrainosus: Secondary | ICD-10-CM | POA: Diagnosis not present

## 2015-05-29 DIAGNOSIS — J019 Acute sinusitis, unspecified: Secondary | ICD-10-CM | POA: Diagnosis not present

## 2015-06-03 DIAGNOSIS — Z131 Encounter for screening for diabetes mellitus: Secondary | ICD-10-CM | POA: Diagnosis not present

## 2015-07-11 DIAGNOSIS — J020 Streptococcal pharyngitis: Secondary | ICD-10-CM | POA: Diagnosis not present

## 2015-07-11 DIAGNOSIS — J029 Acute pharyngitis, unspecified: Secondary | ICD-10-CM | POA: Diagnosis not present

## 2015-08-08 DIAGNOSIS — R300 Dysuria: Secondary | ICD-10-CM | POA: Diagnosis not present

## 2015-09-06 DIAGNOSIS — R300 Dysuria: Secondary | ICD-10-CM | POA: Diagnosis not present

## 2015-10-03 DIAGNOSIS — N911 Secondary amenorrhea: Secondary | ICD-10-CM | POA: Diagnosis not present

## 2015-11-19 DIAGNOSIS — Z131 Encounter for screening for diabetes mellitus: Secondary | ICD-10-CM | POA: Diagnosis not present

## 2015-11-19 DIAGNOSIS — Z13220 Encounter for screening for lipoid disorders: Secondary | ICD-10-CM | POA: Diagnosis not present

## 2015-11-19 DIAGNOSIS — Z6841 Body Mass Index (BMI) 40.0 and over, adult: Secondary | ICD-10-CM | POA: Diagnosis not present

## 2015-11-19 DIAGNOSIS — Z1329 Encounter for screening for other suspected endocrine disorder: Secondary | ICD-10-CM | POA: Diagnosis not present

## 2015-11-19 DIAGNOSIS — Z1151 Encounter for screening for human papillomavirus (HPV): Secondary | ICD-10-CM | POA: Diagnosis not present

## 2015-11-19 DIAGNOSIS — Z01419 Encounter for gynecological examination (general) (routine) without abnormal findings: Secondary | ICD-10-CM | POA: Diagnosis not present

## 2015-11-19 DIAGNOSIS — Z130 Encounter for screening for diseases of the blood and blood-forming organs and certain disorders involving the immune mechanism: Secondary | ICD-10-CM | POA: Diagnosis not present

## 2015-11-19 DIAGNOSIS — Z0000 Encounter for general adult medical examination without abnormal findings: Secondary | ICD-10-CM | POA: Diagnosis not present

## 2016-01-14 ENCOUNTER — Encounter: Payer: Self-pay | Admitting: Physician Assistant

## 2016-01-14 ENCOUNTER — Ambulatory Visit (INDEPENDENT_AMBULATORY_CARE_PROVIDER_SITE_OTHER): Payer: BLUE CROSS/BLUE SHIELD | Admitting: Physician Assistant

## 2016-01-14 VITALS — BP 110/80 | HR 101 | Temp 97.9°F | Resp 16 | Ht 65.0 in | Wt 299.0 lb

## 2016-01-14 DIAGNOSIS — G4700 Insomnia, unspecified: Secondary | ICD-10-CM

## 2016-01-14 DIAGNOSIS — G43709 Chronic migraine without aura, not intractable, without status migrainosus: Secondary | ICD-10-CM

## 2016-01-14 DIAGNOSIS — Z114 Encounter for screening for human immunodeficiency virus [HIV]: Secondary | ICD-10-CM

## 2016-01-14 DIAGNOSIS — F418 Other specified anxiety disorders: Secondary | ICD-10-CM

## 2016-01-14 DIAGNOSIS — F32A Depression, unspecified: Secondary | ICD-10-CM

## 2016-01-14 DIAGNOSIS — H6981 Other specified disorders of Eustachian tube, right ear: Secondary | ICD-10-CM | POA: Diagnosis not present

## 2016-01-14 DIAGNOSIS — R3915 Urgency of urination: Secondary | ICD-10-CM | POA: Diagnosis not present

## 2016-01-14 DIAGNOSIS — F419 Anxiety disorder, unspecified: Principal | ICD-10-CM

## 2016-01-14 DIAGNOSIS — F329 Major depressive disorder, single episode, unspecified: Secondary | ICD-10-CM

## 2016-01-14 DIAGNOSIS — IMO0002 Reserved for concepts with insufficient information to code with codable children: Secondary | ICD-10-CM

## 2016-01-14 LAB — URINALYSIS, ROUTINE W REFLEX MICROSCOPIC
Bilirubin Urine: NEGATIVE
Ketones, ur: NEGATIVE
Leukocytes, UA: NEGATIVE
Nitrite: NEGATIVE
Specific Gravity, Urine: >=1.03 — AB (ref 1.000–1.030)
Total Protein, Urine: NEGATIVE
Urine Glucose: NEGATIVE
Urobilinogen, UA: 0.2 (ref 0.0–1.0)
WBC, UA: NONE SEEN (ref 0–?)
pH: 6 (ref 5.0–8.0)

## 2016-01-14 MED ORDER — LORAZEPAM 1 MG PO TABS: 1.0000 mg | ORAL_TABLET | Freq: Every day | ORAL | 0 refills | Status: DC | PRN

## 2016-01-14 MED ORDER — ZOLPIDEM TARTRATE 10 MG PO TABS: 10.0000 mg | ORAL_TABLET | Freq: Every day | ORAL | 1 refills | Status: DC

## 2016-01-14 NOTE — Progress Notes (Signed)
Patient presents to clinic today to establish care.  Acute Concerns: R ear pressure x few weeks. Denies popping. Denies cough, chest congestion. Noted mild PND the other day that has resolved. Is taking Xyzal as directed for allergy symptoms.  Patient with history of recurrent UTI and OAB, previously followed by Urology. Has bladder sling in place. Endorses some mld urinary urgency without dysuria, pressure, low back pain, abdominal pain, fever, chills, nausea or vomiting. Would like her urine checked today giving history. Has increased hydration.  Chronic Issues: Anxiety/Depression -- Patient is currently on Effexor XR 150 mg daily. Endorses taking daily as directed. Endorses good mood overall. Denies breakthrough symptoms. Denies SI/HI.   Insomnia -- Currently on Ambien 10 mg nightly. Has been on multiple other drugs previously including melatonin, lunesta and belsomra without success. Amitriptyline 50 mg tolerable but higher doses make her too groggy. Endorses doing very well with current combination for some time.   Migraine -- Is currently on Amitriptyline 50 mg nightly for prevention. Endorses great relief of symptoms with this regimen.  Endorses breakthrough migraine about once every 2 months. Has Imitrex that she uses for abortive therapy with good results.    Health Maintenance: Immunizations -- up-to-date PAP -- up-to-date  Past Medical History:  Diagnosis Date  . Allergy   . Anxiety   . Depression   . GERD (gastroesophageal reflux disease)   . IBS (irritable bowel syndrome)   . Migraine   . Overactive bladder   . PCOS (polycystic ovarian syndrome)   . Pelvic pain   . Pseudotumor cerebri   . SUI (stress urinary incontinence, female)   . Urinary incontinence     Past Surgical History:  Procedure Laterality Date  . CESAREAN SECTION  2009  &  2011   2011 W/ BILATERAL TUBAL LIGATION  . CHOLECYSTECTOMY  2010  . CYSTO WITH HYDRODISTENSION  03/01/2012   Procedure:  CYSTOSCOPY/HYDRODISTENSION;  Surgeon: Martina SinnerScott A MacDiarmid, MD;  [facility] [place];  Service: Urology;  Laterality: N/A;  INSTILLATION of marcaine and pyridium.  . PUBOVAGINAL SLING N/A 02/14/2013   Procedure: PUBO-VAGINAL SLING AND HYDRODISTENSION;  Surgeon: Martina SinnerScott A MacDiarmid, MD;  [facility] [place];  Service: Urology;  Laterality: N/A;  . TRANSTHORACIC ECHOCARDIOGRAM  06/2009  . TUBAL LIGATION      Current Outpatient Prescriptions on File Prior to Visit  Medication Sig Dispense Refill  . acetaminophen (TYLENOL) 500 MG tablet Take 500 mg by mouth every 6 (six) hours as needed.    Marland Kitchen. amitriptyline (ELAVIL) 25 MG tablet Take 50 mg by mouth at bedtime.     . butalbital-acetaminophen-caffeine (FIORICET, ESGIC) 50-325-40 MG per tablet Take 1 tablet by mouth 2 (two) times daily as needed for headache.    . Multiple Vitamin (MULTIVITAMIN) tablet Take 1 tablet by mouth daily.    Marland Kitchen. venlafaxine XR (EFFEXOR-XR) 150 MG 24 hr capsule Take 150 mg by mouth daily with breakfast.     No current facility-administered medications on file prior to visit.     No Known Allergies  Family History  Problem Relation Age of Onset  . Hypertension Mother   . Depression Mother   . Hypertension Father   . Cancer Father     kidney  . Depression Father   . Cancer Maternal Uncle     stomach  . Hypertension Maternal Grandmother   . Cancer Maternal Grandmother     kidney, bone  . Hypertension Maternal Grandfather   . Cancer Maternal  Grandfather     lung, bone  . Stroke Maternal Grandfather   . Hypertension Paternal Grandmother   . Hypertension Paternal Grandfather   . Stroke Paternal Grandfather     Social History   Social History  . Marital status: Married    Spouse name: N/A  . Number of children: N/A  . Years of education: N/A   Occupational History  . [employer]   Social History Main Topics  . Smoking status: Never Smoker  . Smokeless tobacco:  Never Used  . Alcohol use No  . Drug use: No  . Sexual activity: Yes    Birth control/ protection: Surgical   Other Topics Concern  . Not on file   Social History Narrative  . No narrative on file    Review of Systems  Constitutional: Negative for fever and weight loss.  HENT: Positive for congestion and ear pain. Negative for ear discharge, hearing loss and tinnitus.   Eyes: Negative for blurred vision, double vision, photophobia and pain.  Respiratory: Negative for cough and shortness of breath.   Cardiovascular: Negative for chest pain and palpitations.  Gastrointestinal: Negative for abdominal pain, blood in stool, constipation, diarrhea, heartburn, melena, nausea and vomiting.  Genitourinary: Positive for urgency. Negative for dysuria, flank pain, frequency and hematuria.  Musculoskeletal: Negative for falls.  Neurological: Negative for dizziness, loss of consciousness and headaches.  Endo/Heme/Allergies: Negative for environmental allergies.  Psychiatric/Behavioral: Negative for depression, hallucinations, substance abuse and suicidal ideas. The patient is not nervous/anxious and does not have insomnia.     BP 110/80   Pulse (!) 101   Temp 97.9 F (36.6 C) (Oral)   Resp 16   Ht 5\' 5"  (1.651 m)   Wt 299 lb (135.6 kg)   SpO2 99%   BMI 49.76 kg/m   Physical Exam  Constitutional: She is well-developed, well-nourished, and in no distress.  HENT:  Head: Normocephalic and atraumatic.  Right Ear: Tympanic membrane is not erythematous, not retracted and not bulging. A middle ear effusion is present.  Left Ear: Tympanic membrane normal.  Nose: Nose normal.  Eyes: Conjunctivae are normal.  Neck: Neck supple.  Cardiovascular: Normal rate, regular rhythm, normal heart sounds and intact distal pulses.   Pulmonary/Chest: Effort normal and breath sounds normal. No respiratory distress. She has no wheezes. She has no rales. She exhibits no tenderness.  Abdominal: Soft. Bowel  sounds are normal. She exhibits no distension. There is no tenderness.  Negative CVA tenderness  Skin: Skin is warm and dry. No rash noted.  Psychiatric: Affect normal.  Vitals reviewed.  Assessment/Plan: Chronic migraine Doing well without breakthrough symptoms. Vitals stable. Continue current regimen.  Eustachian tube dysfunction, right Rx Dymista. Supportive measures and OTC medications reviewed. FU if not resolving.   Anxiety and depression Doing very well. Continue Effexor XR and Amitriptyline.   Insomnia Stable. Continue current regimen.   Screening for HIV (human immunodeficiency virus) HIV ab with reflex ordered for one-time screen.   Urinary urgency Urine dip with RBC. Will send for micro and culture to further assess. Will hold off on treatment until further labs have resulted. Supportive measures reviewed.     Piedad ClimesMartin, Rusty Glodowski Cody, PA-C

## 2016-01-14 NOTE — Progress Notes (Signed)
Pre visit review using our clinic review tool, if applicable. No additional management support is needed unless otherwise documented below in the visit note. 

## 2016-01-14 NOTE — Patient Instructions (Addendum)
Please go to the lab for blood work.  Start the Dymista as directed instead of the Flonase. Continue the Xyzal. Follow-up if symptoms are not improving.  Please follow-up with your specialists as scheduled.   Schedule an appointment for a complete physical ASAP.  I will call you with your urine results.

## 2016-01-15 LAB — URINE CULTURE

## 2016-01-15 LAB — HIV ANTIBODY (ROUTINE TESTING W REFLEX): HIV 1&2 Ab, 4th Generation: NONREACTIVE

## 2016-01-15 MED ORDER — AZELASTINE-FLUTICASONE 137-50 MCG/ACT NA SUSP: 1.0000 | Freq: Two times a day (BID) | NASAL | 1 refills | Status: DC

## 2016-01-16 ENCOUNTER — Encounter: Payer: Self-pay | Admitting: Physician Assistant

## 2016-01-17 ENCOUNTER — Encounter: Payer: Self-pay | Admitting: Physician Assistant

## 2016-01-18 DIAGNOSIS — G43709 Chronic migraine without aura, not intractable, without status migrainosus: Secondary | ICD-10-CM | POA: Insufficient documentation

## 2016-01-18 DIAGNOSIS — F329 Major depressive disorder, single episode, unspecified: Secondary | ICD-10-CM | POA: Insufficient documentation

## 2016-01-18 DIAGNOSIS — G4700 Insomnia, unspecified: Secondary | ICD-10-CM | POA: Insufficient documentation

## 2016-01-18 DIAGNOSIS — Z114 Encounter for screening for human immunodeficiency virus [HIV]: Secondary | ICD-10-CM | POA: Insufficient documentation

## 2016-01-18 DIAGNOSIS — F419 Anxiety disorder, unspecified: Secondary | ICD-10-CM | POA: Insufficient documentation

## 2016-01-18 DIAGNOSIS — H6981 Other specified disorders of Eustachian tube, right ear: Secondary | ICD-10-CM | POA: Insufficient documentation

## 2016-01-18 DIAGNOSIS — F32A Depression, unspecified: Secondary | ICD-10-CM | POA: Insufficient documentation

## 2016-01-18 DIAGNOSIS — R3915 Urgency of urination: Secondary | ICD-10-CM | POA: Insufficient documentation

## 2016-01-18 DIAGNOSIS — F411 Generalized anxiety disorder: Secondary | ICD-10-CM | POA: Insufficient documentation

## 2016-01-18 DIAGNOSIS — IMO0002 Reserved for concepts with insufficient information to code with codable children: Secondary | ICD-10-CM | POA: Insufficient documentation

## 2016-01-18 NOTE — Assessment & Plan Note (Signed)
HIV ab with reflex ordered for one-time screen.

## 2016-01-18 NOTE — Assessment & Plan Note (Signed)
Doing well without breakthrough symptoms. Vitals stable. Continue current regimen.

## 2016-01-18 NOTE — Assessment & Plan Note (Signed)
Urine dip with RBC. Will send for micro and culture to further assess. Will hold off on treatment until further labs have resulted. Supportive measures reviewed.

## 2016-01-18 NOTE — Assessment & Plan Note (Signed)
Doing very well. Continue Effexor XR and Amitriptyline.

## 2016-01-18 NOTE — Assessment & Plan Note (Signed)
Rx Dymista. Supportive measures and OTC medications reviewed. FU if not resolving.

## 2016-01-18 NOTE — Assessment & Plan Note (Signed)
Stable  Continue current regimen  

## 2016-02-16 ENCOUNTER — Other Ambulatory Visit: Payer: Self-pay | Admitting: Physician Assistant

## 2016-02-17 NOTE — Telephone Encounter (Signed)
OK to refill same quantity and signature.  Needs CSC and UDS at pickup.

## 2016-02-17 NOTE — Telephone Encounter (Signed)
Lorazepam last filled 01/14/16 #30 No CSC on file Please advise of refill

## 2016-02-19 ENCOUNTER — Encounter: Payer: Self-pay | Admitting: Emergency Medicine

## 2016-02-19 NOTE — Telephone Encounter (Signed)
Advised patient of rx ready for pick up and will need to give a UDS and sign a new CSC. Patient is agreeable

## 2016-03-17 ENCOUNTER — Encounter: Payer: Self-pay | Admitting: Physician Assistant

## 2016-03-18 ENCOUNTER — Other Ambulatory Visit: Payer: Self-pay | Admitting: Physician Assistant

## 2016-03-19 ENCOUNTER — Encounter: Payer: Self-pay | Admitting: Physician Assistant

## 2016-03-19 DIAGNOSIS — Z79899 Other long term (current) drug therapy: Secondary | ICD-10-CM | POA: Diagnosis not present

## 2016-03-25 ENCOUNTER — Encounter: Payer: Self-pay | Admitting: Physician Assistant

## 2016-04-21 ENCOUNTER — Ambulatory Visit (INDEPENDENT_AMBULATORY_CARE_PROVIDER_SITE_OTHER): Payer: BLUE CROSS/BLUE SHIELD | Admitting: Physician Assistant

## 2016-04-21 ENCOUNTER — Encounter: Payer: Self-pay | Admitting: Physician Assistant

## 2016-04-21 VITALS — BP 122/84 | HR 106 | Temp 98.9°F | Resp 18 | Ht 64.25 in | Wt 298.0 lb

## 2016-04-21 DIAGNOSIS — G4700 Insomnia, unspecified: Secondary | ICD-10-CM

## 2016-04-21 DIAGNOSIS — J019 Acute sinusitis, unspecified: Secondary | ICD-10-CM | POA: Diagnosis not present

## 2016-04-21 DIAGNOSIS — B9689 Other specified bacterial agents as the cause of diseases classified elsewhere: Secondary | ICD-10-CM

## 2016-04-21 MED ORDER — SUVOREXANT 10 MG PO TABS: 10.0000 mg | ORAL_TABLET | Freq: Every day | ORAL | 1 refills | Status: DC

## 2016-04-21 MED ORDER — AZELASTINE HCL 0.1 % NA SOLN
2.0000 | Freq: Two times a day (BID) | NASAL | 12 refills | Status: DC
Start: 2016-04-21 — End: 2016-08-31

## 2016-04-21 MED ORDER — FLUCONAZOLE 150 MG PO TABS: 150.0000 mg | ORAL_TABLET | Freq: Once | ORAL | 0 refills | Status: AC

## 2016-04-21 MED ORDER — AMOXICILLIN-POT CLAVULANATE 875-125 MG PO TABS: 1.0000 | ORAL_TABLET | Freq: Two times a day (BID) | ORAL | 0 refills | Status: DC

## 2016-04-21 NOTE — Assessment & Plan Note (Signed)
Stop Ambien. Will start Belsomra 10 mg nightly. Will increase to 15 mg nightly once tolerating. Discussed will take some time to get restful sleep after coming off of the Ambien.

## 2016-04-21 NOTE — Progress Notes (Signed)
Patient presents to clinic today for acute concern and to discuss medications.  Patient endorses 2 weeks of sinus pressure with significant PND. Endorses thick, foul-smelling rhinorrhea. Endorses frontal sinus pain. Endorses mild ear pain bilaterally but denies tooth pain. Denies chest congestion or cough. Endorses fever starting today with Tmax 99.4. Denies recent travel. Denies sick contact. Is currently taking Mucinex and Xyzal with only some improvement in symptoms.   Patient would like to discuss weaning off of the Ambien. Had tried previously with prior PCP -- had been switched to Trazodone and Belsomra without any restful sleep. Notes only being on 5 mg Belsomra. Denies side effect, just that medication was subtherapeutic.  Past Medical History:  Diagnosis Date  . Allergy   . Anxiety   . Depression   . GERD (gastroesophageal reflux disease)   . IBS (irritable bowel syndrome)   . Migraine   . Overactive bladder   . PCOS (polycystic ovarian syndrome)   . Pelvic pain   . Pseudotumor cerebri   . SUI (stress urinary incontinence, female)   . Urinary incontinence     Current Outpatient Prescriptions on File Prior to Visit  Medication Sig Dispense Refill  . acetaminophen (TYLENOL) 500 MG tablet Take 500 mg by mouth every 6 (six) hours as needed.    Marland Kitchen. amitriptyline (ELAVIL) 25 MG tablet Take 50 mg by mouth at bedtime.     . dicyclomine (BENTYL) 10 MG capsule Take 10 mg by mouth as needed for spasms.    Marland Kitchen. levocetirizine (XYZAL) 5 MG tablet Take 5 mg by mouth every evening.  5  . LORazepam (ATIVAN) 1 MG tablet TAKE 1 TABLET BY MOUTH ONCE DAILY AS NEEDED 30 tablet 0  . Melatonin 10 MG TABS Take 1 tablet by mouth at bedtime.    . Multiple Vitamin (MULTIVITAMIN) tablet Take 1 tablet by mouth daily.    . Omega-3 Fatty Acids (FISH OIL) 1200 MG CAPS Take 1 capsule by mouth daily.    Marland Kitchen. omeprazole (PRILOSEC) 40 MG capsule Take 40 mg by mouth daily.    . Probiotic Product (PROBIOTIC DAILY  PO) Take 1 tablet by mouth daily.    Marland Kitchen. spironolactone (ALDACTONE) 100 MG tablet Take 100 mg by mouth daily.  4  . SPRINTEC 28 0.25-35 MG-MCG tablet Take 1 tablet by mouth daily.  4  . SUMAtriptan (IMITREX) 50 MG tablet Take 50 mg by mouth every 2 (two) hours as needed for migraine. May repeat in 2 hours if headache persists or recurs.    . venlafaxine XR (EFFEXOR-XR) 150 MG 24 hr capsule Take 150 mg by mouth daily with breakfast.    . butalbital-acetaminophen-caffeine (FIORICET, ESGIC) 50-325-40 MG per tablet Take 1 tablet by mouth 2 (two) times daily as needed for headache.     No current facility-administered medications on file prior to visit.     No Known Allergies  Family History  Problem Relation Age of Onset  . Hypertension Mother   . Depression Mother   . Hypertension Father   . Cancer Father     kidney  . Depression Father   . Cancer Maternal Uncle     stomach  . Hypertension Maternal Grandmother   . Cancer Maternal Grandmother     kidney, bone  . Hypertension Maternal Grandfather   . Cancer Maternal Grandfather     lung, bone  . Stroke Maternal Grandfather   . Hypertension Paternal Grandmother   . Hypertension Paternal Grandfather   . Stroke Paternal  Grandfather     Social History   Social History  . Marital status: Married    Spouse name: N/A  . Number of children: N/A  . Years of education: N/A   Occupational History  . [employer]   Social History Main Topics  . Smoking status: Never Smoker  . Smokeless tobacco: Never Used  . Alcohol use No  . Drug use: No  . Sexual activity: Yes    Birth control/ protection: Surgical   Other Topics Concern  . None   Social History Narrative  . None   Review of Systems - See HPI.  All other ROS are negative.  BP 122/84 (BP Location: Right Arm, Patient Position: Sitting, Cuff Size: Large)   Pulse (!) 106   Temp 98.9 F (37.2 C) (Oral)   Resp 18   Ht 5' 4.25" (1.632 m)   Wt 298 lb (135.2 kg)    LMP 04/21/2016   SpO2 96%   BMI 50.75 kg/m   Physical Exam  Constitutional: She is oriented to person, place, and time and well-developed, well-nourished, and in no distress.  HENT:  Head: Normocephalic and atraumatic.  Right Ear: Tympanic membrane normal.  Left Ear: Tympanic membrane normal.  Nose: Mucosal edema and rhinorrhea present. Right sinus exhibits maxillary sinus tenderness. Left sinus exhibits maxillary sinus tenderness.  Mouth/Throat: Uvula is midline, oropharynx is clear and moist and mucous membranes are normal.  Eyes: Conjunctivae are normal.  Neck: Neck supple.  Cardiovascular: Normal rate, regular rhythm, normal heart sounds and intact distal pulses.   Pulmonary/Chest: Effort normal and breath sounds normal. No respiratory distress. She has no wheezes. She has no rales. She exhibits no tenderness.  Neurological: She is alert and oriented to person, place, and time.  Skin: Skin is warm and dry. No rash noted.  Psychiatric: Affect normal.  Vitals reviewed.  Assessment/Plan: Insomnia Stop Ambien. Will start Belsomra 10 mg nightly. Will increase to 15 mg nightly once tolerating. Discussed will take some time to get restful sleep after coming off of the Ambien.  Acute bacterial sinusitis Rx Augmentin.  Increase fluids.  Rest.  Saline nasal spray.  Probiotic.  Mucinex as directed.  Humidifier in bedroom.  Call or return to clinic if symptoms are not improving.     Piedad Climes, PA-C

## 2016-04-21 NOTE — Assessment & Plan Note (Signed)
Rx Augmentin.  Increase fluids.  Rest.  Saline nasal spray.  Probiotic.  Mucinex as directed.  Humidifier in bedroom.  Call or return to clinic if symptoms are not improving.  

## 2016-04-21 NOTE — Patient Instructions (Signed)
Please take antibiotic as directed.  Increase fluid intake.  Use Saline nasal spray.  Take a daily multivitamin.  Place a humidifier in the bedroom.  Please call or return clinic if symptoms are not improving.  Stop the Ambien and start the Belsomra. We will start at 10 mg dose and go up to 15 once tolerating well.  Sinusitis Sinusitis is redness, soreness, and swelling (inflammation) of the paranasal sinuses. Paranasal sinuses are air pockets within the bones of your face (beneath the eyes, the middle of the forehead, or above the eyes). In healthy paranasal sinuses, mucus is able to drain out, and air is able to circulate through them by way of your nose. However, when your paranasal sinuses are inflamed, mucus and air can become trapped. This can allow bacteria and other germs to grow and cause infection. Sinusitis can develop quickly and last only a short time (acute) or continue over a long period (chronic). Sinusitis that lasts for more than 12 weeks is considered chronic.  CAUSES  Causes of sinusitis include:  Allergies.  Structural abnormalities, such as displacement of the cartilage that separates your nostrils (deviated septum), which can decrease the air flow through your nose and sinuses and affect sinus drainage.  Functional abnormalities, such as when the small hairs (cilia) that line your sinuses and help remove mucus do not work properly or are not present. SYMPTOMS  Symptoms of acute and chronic sinusitis are the same. The primary symptoms are pain and pressure around the affected sinuses. Other symptoms include:  Upper toothache.  Earache.  Headache.  Bad breath.  Decreased sense of smell and taste.  A cough, which worsens when you are lying flat.  Fatigue.  Fever.  Thick drainage from your nose, which often is green and may contain pus (purulent).  Swelling and warmth over the affected sinuses. DIAGNOSIS  Your caregiver will perform a physical exam. During the  exam, your caregiver may:  Look in your nose for signs of abnormal growths in your nostrils (nasal polyps).  Tap over the affected sinus to check for signs of infection.  View the inside of your sinuses (endoscopy) with a special imaging device with a light attached (endoscope), which is inserted into your sinuses. If your caregiver suspects that you have chronic sinusitis, one or more of the following tests may be recommended:  Allergy tests.  Nasal culture A sample of mucus is taken from your nose and sent to a lab and screened for bacteria.  Nasal cytology A sample of mucus is taken from your nose and examined by your caregiver to determine if your sinusitis is related to an allergy. TREATMENT  Most cases of acute sinusitis are related to a viral infection and will resolve on their own within 10 days. Sometimes medicines are prescribed to help relieve symptoms (pain medicine, decongestants, nasal steroid sprays, or saline sprays).  However, for sinusitis related to a bacterial infection, your caregiver will prescribe antibiotic medicines. These are medicines that will help kill the bacteria causing the infection.  Rarely, sinusitis is caused by a fungal infection. In theses cases, your caregiver will prescribe antifungal medicine. For some cases of chronic sinusitis, surgery is needed. Generally, these are cases in which sinusitis recurs more than 3 times per year, despite other treatments. HOME CARE INSTRUCTIONS   Drink plenty of water. Water helps thin the mucus so your sinuses can drain more easily.  Use a humidifier.  Inhale steam 3 to 4 times a day (for example,  sit in the bathroom with the shower running).  Apply a warm, moist washcloth to your face 3 to 4 times a day, or as directed by your caregiver.  Use saline nasal sprays to help moisten and clean your sinuses.  Take over-the-counter or prescription medicines for pain, discomfort, or fever only as directed by your  caregiver. SEEK IMMEDIATE MEDICAL CARE IF:  You have increasing pain or severe headaches.  You have nausea, vomiting, or drowsiness.  You have swelling around your face.  You have vision problems.  You have a stiff neck.  You have difficulty breathing. MAKE SURE YOU:   Understand these instructions.  Will watch your condition.  Will get help right away if you are not doing well or get worse. Document Released: 01/12/2005 Document Revised: 04/06/2011 Document Reviewed: 01/27/2011 [facility] Patient Information 2014 [place], [place].

## 2016-04-21 NOTE — Progress Notes (Signed)
Pre visit review using our clinic review tool, if applicable. No additional management support is needed unless otherwise documented below in the visit note. 

## 2016-04-21 NOTE — Progress Notes (Deleted)
Patient presents to clinic today for annual exam.  Patient is fasting for labs.  Acute Concerns: ***  Chronic Issues: ***  Health Maintenance: Immunizations -- *** Mammogram -- *** PAP -- ***  Past Medical History:  Diagnosis Date  . Allergy   . Anxiety   . Depression   . GERD (gastroesophageal reflux disease)   . IBS (irritable bowel syndrome)   . Migraine   . Overactive bladder   . PCOS (polycystic ovarian syndrome)   . Pelvic pain   . Pseudotumor cerebri   . SUI (stress urinary incontinence, female)   . Urinary incontinence     Past Surgical History:  Procedure Laterality Date  . CESAREAN SECTION  2009  &  2011   2011 W/ BILATERAL TUBAL LIGATION  . CHOLECYSTECTOMY  2010  . CYSTO WITH HYDRODISTENSION  03/01/2012   Procedure: CYSTOSCOPY/HYDRODISTENSION;  Surgeon: Martina Sinner, MD;  Location: [facility];  Service: Urology;  Laterality: N/A;  INSTILLATION of marcaine and pyridium.  . PUBOVAGINAL SLING N/A 02/14/2013   Procedure: PUBO-VAGINAL SLING AND HYDRODISTENSION;  Surgeon: Martina Sinner, MD;  Location: [facility];  Service: Urology;  Laterality: N/A;  . TRANSTHORACIC ECHOCARDIOGRAM  06/2009  . TUBAL LIGATION      Current Outpatient Prescriptions on File Prior to Visit  Medication Sig Dispense Refill  . acetaminophen (TYLENOL) 500 MG tablet Take 500 mg by mouth every 6 (six) hours as needed.    Marland Kitchen amitriptyline (ELAVIL) 25 MG tablet Take 50 mg by mouth at bedtime.     . Azelastine-Fluticasone 137-50 MCG/ACT SUSP Place 1 spray into the nose 2 (two) times daily. 23 g 1  . butalbital-acetaminophen-caffeine (FIORICET, ESGIC) 50-325-40 MG per tablet Take 1 tablet by mouth 2 (two) times daily as needed for headache.    . dicyclomine (BENTYL) 10 MG capsule Take 10 mg by mouth as needed for spasms.    Marland Kitchen levocetirizine (XYZAL) 5 MG tablet Take 5 mg by mouth every evening.  5  . LORazepam (ATIVAN) 1 MG tablet TAKE 1 TABLET BY  MOUTH ONCE DAILY AS NEEDED 30 tablet 0  . Melatonin 10 MG TABS Take 1 tablet by mouth at bedtime.    . Multiple Vitamin (MULTIVITAMIN) tablet Take 1 tablet by mouth daily.    . Omega-3 Fatty Acids (FISH OIL) 1200 MG CAPS Take 1 capsule by mouth daily.    Marland Kitchen omeprazole (PRILOSEC) 40 MG capsule Take 40 mg by mouth daily.    . Probiotic Product (PROBIOTIC DAILY PO) Take 1 tablet by mouth daily.    Marland Kitchen spironolactone (ALDACTONE) 100 MG tablet Take 100 mg by mouth daily.  4  . SPRINTEC 28 0.25-35 MG-MCG tablet Take 1 tablet by mouth daily.  4  . SUMAtriptan (IMITREX) 50 MG tablet Take 50 mg by mouth every 2 (two) hours as needed for migraine. May repeat in 2 hours if headache persists or recurs.    . venlafaxine XR (EFFEXOR-XR) 150 MG 24 hr capsule Take 150 mg by mouth daily with breakfast.    . zolpidem (AMBIEN) 10 MG tablet Take 1 tablet (10 mg total) by mouth at bedtime. 30 tablet 1   No current facility-administered medications on file prior to visit.     No Known Allergies  Family History  Problem Relation Age of Onset  . Hypertension Mother   . Depression Mother   . Hypertension Father   . Cancer Father     kidney  .  Depression Father   . Cancer Maternal Uncle     stomach  . Hypertension Maternal Grandmother   . Cancer Maternal Grandmother     kidney, bone  . Hypertension Maternal Grandfather   . Cancer Maternal Grandfather     lung, bone  . Stroke Maternal Grandfather   . Hypertension Paternal Grandmother   . Hypertension Paternal Grandfather   . Stroke Paternal Grandfather     Social History   Social History  . Marital status: Married    Spouse name: N/A  . Number of children: N/A  . Years of education: N/A   Occupational History  . [employer]   Social History Main Topics  . Smoking status: Never Smoker  . Smokeless tobacco: Never Used  . Alcohol use No  . Drug use: No  . Sexual activity: Yes    Birth control/ protection: Surgical   Other Topics  Concern  . Not on file   Social History Narrative  . No narrative on file   Review of Systems  Constitutional: Negative for fever and weight loss.  HENT: Negative for ear discharge, ear pain, hearing loss and tinnitus.   Eyes: Negative for blurred vision, double vision, photophobia and pain.  Respiratory: Negative for cough and shortness of breath.   Cardiovascular: Negative for chest pain and palpitations.  Gastrointestinal: Negative for abdominal pain, blood in stool, constipation, diarrhea, heartburn, melena, nausea and vomiting.  Genitourinary: Negative for dysuria, flank pain, frequency, hematuria and urgency.  Musculoskeletal: Negative for falls.  Neurological: Negative for dizziness, loss of consciousness and headaches.  Endo/Heme/Allergies: Negative for environmental allergies.  Psychiatric/Behavioral: Negative for depression, hallucinations, substance abuse and suicidal ideas. The patient is not nervous/anxious and does not have insomnia.     There were no vitals taken for this visit.  Physical Exam  Constitutional: She is oriented to person, place, and time and well-developed, well-nourished, and in no distress.  HENT:  Head: Normocephalic and atraumatic.  Right Ear: Tympanic membrane, external ear and ear canal normal.  Left Ear: Tympanic membrane, external ear and ear canal normal.  Nose: Nose normal. No mucosal edema.  Mouth/Throat: Uvula is midline, oropharynx is clear and moist and mucous membranes are normal. No oropharyngeal exudate or posterior oropharyngeal erythema.  Eyes: Conjunctivae are normal. Pupils are equal, round, and reactive to light.  Neck: Neck supple. No thyromegaly present.  Cardiovascular: Normal rate, regular rhythm, normal heart sounds and intact distal pulses.   Pulmonary/Chest: Effort normal and breath sounds normal. No respiratory distress. She has no wheezes. She has no rales.  Abdominal: Soft. Bowel sounds are normal. She exhibits no  distension and no mass. There is no tenderness. There is no rebound and no guarding.  Lymphadenopathy:    She has no cervical adenopathy.  Neurological: She is alert and oriented to person, place, and time. No cranial nerve deficit.  Skin: Skin is warm and dry. No rash noted.  Psychiatric: Affect normal.  Vitals reviewed.  Assessment/Plan: No problem-specific Assessment & Plan notes found for this encounter.    Piedad ClimesMartin, Barry Faircloth Cody, PA-C

## 2016-05-05 ENCOUNTER — Ambulatory Visit (INDEPENDENT_AMBULATORY_CARE_PROVIDER_SITE_OTHER): Payer: BLUE CROSS/BLUE SHIELD | Admitting: Physician Assistant

## 2016-05-05 ENCOUNTER — Encounter: Payer: Self-pay | Admitting: Physician Assistant

## 2016-05-05 VITALS — BP 122/82 | Temp 98.2°F | Resp 14 | Ht 64.0 in | Wt 297.0 lb

## 2016-05-05 DIAGNOSIS — Z0001 Encounter for general adult medical examination with abnormal findings: Secondary | ICD-10-CM

## 2016-05-05 DIAGNOSIS — R21 Rash and other nonspecific skin eruption: Secondary | ICD-10-CM | POA: Diagnosis not present

## 2016-05-05 DIAGNOSIS — R350 Frequency of micturition: Secondary | ICD-10-CM | POA: Diagnosis not present

## 2016-05-05 DIAGNOSIS — Z0000 Encounter for general adult medical examination without abnormal findings: Secondary | ICD-10-CM | POA: Insufficient documentation

## 2016-05-05 LAB — URINALYSIS, ROUTINE W REFLEX MICROSCOPIC
Bilirubin Urine: NEGATIVE
Ketones, ur: NEGATIVE
Leukocytes, UA: NEGATIVE
Nitrite: NEGATIVE
Specific Gravity, Urine: 1.025 (ref 1.000–1.030)
Total Protein, Urine: NEGATIVE
Urine Glucose: NEGATIVE
Urobilinogen, UA: 0.2 (ref 0.0–1.0)
pH: 6 (ref 5.0–8.0)

## 2016-05-05 LAB — COMPREHENSIVE METABOLIC PANEL
ALT: 17 U/L (ref 0–35)
AST: 15 U/L (ref 0–37)
Albumin: 3.7 g/dL (ref 3.5–5.2)
Alkaline Phosphatase: 50 U/L (ref 39–117)
BUN: 11 mg/dL (ref 6–23)
CO2: 25 mEq/L (ref 19–32)
Calcium: 9 mg/dL (ref 8.4–10.5)
Chloride: 104 mEq/L (ref 96–112)
Creatinine, Ser: 0.78 mg/dL (ref 0.40–1.20)
GFR: 90.45 mL/min (ref >60.00)
Glucose, Bld: 102 mg/dL — ABNORMAL HIGH (ref 70–99)
Potassium: 4.4 mEq/L (ref 3.5–5.1)
Sodium: 138 mEq/L (ref 135–145)
Total Bilirubin: 0.3 mg/dL (ref 0.2–1.2)
Total Protein: 6.6 g/dL (ref 6.0–8.3)

## 2016-05-05 LAB — CBC
HCT: 41.5 % (ref 36.0–46.0)
Hemoglobin: 13.8 g/dL (ref 12.0–15.0)
MCHC: 33.3 g/dL (ref 30.0–36.0)
MCV: 94.1 fl (ref 78.0–100.0)
Platelets: 346 10*3/uL (ref 150.0–400.0)
RBC: 4.41 Mil/uL (ref 3.87–5.11)
RDW: 13.6 % (ref 11.5–15.5)
WBC: 9.2 10*3/uL (ref 4.0–10.5)

## 2016-05-05 LAB — HEMOGLOBIN A1C: Hgb A1c MFr Bld: 5.7 % (ref 4.6–6.5)

## 2016-05-05 LAB — LIPID PANEL
Cholesterol: 121 mg/dL (ref 0–200)
HDL: 50.3 mg/dL (ref >39.00)
LDL Cholesterol: 45 mg/dL (ref 0–99)
NonHDL: 70.96
Total CHOL/HDL Ratio: 2
Triglycerides: 131 mg/dL (ref 0.0–149.0)
VLDL: 26.2 mg/dL (ref 0.0–40.0)

## 2016-05-05 LAB — TSH: TSH: 1.25 u[IU]/mL (ref 0.35–4.50)

## 2016-05-05 NOTE — Assessment & Plan Note (Signed)
Mild. Noted on ROS. No other symptoms Will check UA with micro. Add culture if indicated.

## 2016-05-05 NOTE — Progress Notes (Signed)
Pre visit review using our clinic review tool, if applicable. No additional management support is needed unless otherwise documented below in the visit note. 

## 2016-05-05 NOTE — Patient Instructions (Signed)
Please go to the lab for blood work.   Our office will call you with your results unless you have chosen to receive results via MyChart.  If your blood work is normal we will follow-up each year for physicals and as scheduled for chronic medical problems.  If anything is abnormal we will treat accordingly and get you in for a follow-up.  We will call you with urine culture results and treat accordingly. Please send [place] a picture if there is any recurrence of the rash you have mentioned so we can assess further.   Preventive Care 18-39 Years, Female Preventive care refers to lifestyle choices and visits with your health care provider that can promote health and wellness. What does preventive care include?  A yearly physical exam. This is also called an annual well check.  Dental exams once or twice a year.  Routine eye exams. Ask your health care provider how often you should have your eyes checked.  Personal lifestyle choices, including:  Daily care of your teeth and gums.  Regular physical activity.  Eating a healthy diet.  Avoiding tobacco and drug use.  Limiting alcohol use.  Practicing safe sex.  Taking vitamin and mineral supplements as recommended by your health care provider. What happens during an annual well check? The services and screenings done by your health care provider during your annual well check will depend on your age, overall health, lifestyle risk factors, and family history of disease. Counseling  Your health care provider may ask you questions about your:  Alcohol use.  Tobacco use.  Drug use.  Emotional well-being.  Home and relationship well-being.  Sexual activity.  Eating habits.  Work and work Statistician.  Method of birth control.  Menstrual cycle.  Pregnancy history. Screening  You may have the following tests or measurements:  Height, weight, and BMI.  Diabetes screening. This is done by checking your blood sugar  (glucose) after you have not eaten for a while (fasting).  Blood pressure.  Lipid and cholesterol levels. These may be checked every 5 years starting at age 49.  Skin check.  Hepatitis C blood test.  Hepatitis B blood test.  Sexually transmitted disease (STD) testing.  BRCA-related cancer screening. This may be done if you have a family history of breast, ovarian, tubal, or peritoneal cancers.  Pelvic exam and Pap test. This may be done every 3 years starting at age 53. Starting at age 39, this may be done every 5 years if you have a Pap test in combination with an HPV test. Discuss your test results, treatment options, and if necessary, the need for more tests with your health care provider. Vaccines  Your health care provider may recommend certain vaccines, such as:  Influenza vaccine. This is recommended every year.  Tetanus, diphtheria, and acellular pertussis (Tdap, Td) vaccine. You may need a Td booster every 10 years.  Varicella vaccine. You may need this if you have not been vaccinated.  HPV vaccine. If you are 79 or younger, you may need three doses over 6 months.  Measles, mumps, and rubella (MMR) vaccine. You may need at least one dose of MMR. You may also need a second dose.  Pneumococcal 13-valent conjugate (PCV13) vaccine. You may need this if you have certain conditions and were not previously vaccinated.  Pneumococcal polysaccharide (PPSV23) vaccine. You may need one or two doses if you smoke cigarettes or if you have certain conditions.  Meningococcal vaccine. One dose is recommended if you  are age 51-21 years and a first-year college student living in a residence hall, or if you have one of several medical conditions. You may also need additional booster doses.  Hepatitis A vaccine. You may need this if you have certain conditions or if you travel or work in places where you may be exposed to hepatitis A.  Hepatitis B vaccine. You may need this if you have  certain conditions or if you travel or work in places where you may be exposed to hepatitis B.  Haemophilus influenzae type b (Hib) vaccine. You may need this if you have certain risk factors. Talk to your health care provider about which screenings and vaccines you need and how often you need them. This information is not intended to replace advice given to you by your health care provider. Make sure you discuss any questions you have with your health care provider. Document Released: 03/10/2001 Document Revised: 10/02/2015 Document Reviewed: 11/13/2014 Elsevier Interactive Patient Education  2017 [employer]. .

## 2016-05-05 NOTE — Progress Notes (Signed)
Patient presents to clinic today for annual exam.  Patient is fasting for labs. Body mass index is 50.98 kg/m. Patient endorses poor diet. Is not exercising. Has history of PCOS, followed by GYN. Currently on Spironolactone and OCPs.   Acute Concerns: Patient endorses intermittent rash noted with showering at home. States rash occurs just after she finishes her shower. Is mildly pruritic and localized to upper extremities bilaterally. Resolves in 30 minutes or so. This has been present over the past month. Denies rash noted elsewhere. Denies change to soaps, lotions or hygiene products.   Health Maintenance: Immunizations --  PAP -- Followed by GYN - Dr. Cherly Hensen.  Past Medical History:  Diagnosis Date  . Allergy   . Anxiety   . Depression   . GERD (gastroesophageal reflux disease)   . IBS (irritable bowel syndrome)   . Migraine   . Overactive bladder   . PCOS (polycystic ovarian syndrome)   . Pelvic pain   . Pseudotumor cerebri   . SUI (stress urinary incontinence, female)   . Urinary incontinence     Past Surgical History:  Procedure Laterality Date  . CESAREAN SECTION  2009  &  2011   2011 W/ BILATERAL TUBAL LIGATION  . CHOLECYSTECTOMY  2010  . CYSTO WITH HYDRODISTENSION  03/01/2012   Procedure: CYSTOSCOPY/HYDRODISTENSION;  Surgeon: Martina Sinner, MD;  Location: [facility];  Service: Urology;  Laterality: N/A;  INSTILLATION of marcaine and pyridium.  . PUBOVAGINAL SLING N/A 02/14/2013   Procedure: PUBO-VAGINAL SLING AND HYDRODISTENSION;  Surgeon: Martina Sinner, MD;  Location: [facility];  Service: Urology;  Laterality: N/A;  . TRANSTHORACIC ECHOCARDIOGRAM  06/2009  . TUBAL LIGATION      Current Outpatient Prescriptions on File Prior to Visit  Medication Sig Dispense Refill  . acetaminophen (TYLENOL) 500 MG tablet Take 500 mg by mouth every 6 (six) hours as needed.    Marland Kitchen amitriptyline (ELAVIL) 25 MG tablet Take 50 mg by mouth at  bedtime.     Marland Kitchen azelastine (ASTELIN) 0.1 % nasal spray Place 2 sprays into both nostrils 2 (two) times daily. Use in each nostril as directed 30 mL 12  . butalbital-acetaminophen-caffeine (FIORICET, ESGIC) 50-325-40 MG per tablet Take 1 tablet by mouth 2 (two) times daily as needed for headache.    . dicyclomine (BENTYL) 10 MG capsule Take 10 mg by mouth as needed for spasms.    . fluticasone (FLONASE) 50 MCG/ACT nasal spray Place 2 sprays into both nostrils daily.    Marland Kitchen levocetirizine (XYZAL) 5 MG tablet Take 5 mg by mouth every evening.  5  . Melatonin 10 MG TABS Take 1 tablet by mouth at bedtime.    . Multiple Vitamin (MULTIVITAMIN) tablet Take 1 tablet by mouth daily.    . Omega-3 Fatty Acids (FISH OIL) 1200 MG CAPS Take 1 capsule by mouth daily.    Marland Kitchen omeprazole (PRILOSEC) 40 MG capsule Take 40 mg by mouth daily.    . Probiotic Product (PROBIOTIC DAILY PO) Take 1 tablet by mouth daily.    Marland Kitchen spironolactone (ALDACTONE) 100 MG tablet Take 100 mg by mouth daily.  4  . SPRINTEC 28 0.25-35 MG-MCG tablet Take 1 tablet by mouth daily.  4  . SUMAtriptan (IMITREX) 50 MG tablet Take 50 mg by mouth every 2 (two) hours as needed for migraine. May repeat in 2 hours if headache persists or recurs.    . Suvorexant (BELSOMRA) 10 MG TABS Take 10 mg  by mouth at bedtime. 30 tablet 1  . venlafaxine XR (EFFEXOR-XR) 150 MG 24 hr capsule Take 150 mg by mouth daily with breakfast.    . LORazepam (ATIVAN) 1 MG tablet TAKE 1 TABLET BY MOUTH ONCE DAILY AS NEEDED (Patient not taking: Reported on 05/05/2016) 30 tablet 0   No current facility-administered medications on file prior to visit.     No Known Allergies  Family History  Problem Relation Age of Onset  . Hypertension Mother   . Depression Mother   . Hypertension Father   . Cancer Father     kidney  . Depression Father   . Cancer Maternal Uncle     stomach  . Hypertension Maternal Grandmother   . Cancer Maternal Grandmother     kidney, bone  .  Hypertension Maternal Grandfather   . Cancer Maternal Grandfather     lung, bone  . Stroke Maternal Grandfather   . Hypertension Paternal Grandmother   . Hypertension Paternal Grandfather   . Stroke Paternal Grandfather     Social History   Social History  . Marital status: Married    Spouse name: N/A  . Number of children: N/A  . Years of education: N/A   Occupational History  . [employer]   Social History Main Topics  . Smoking status: Never Smoker  . Smokeless tobacco: Never Used  . Alcohol use No  . Drug use: No  . Sexual activity: Yes    Birth control/ protection: Surgical   Other Topics Concern  . Not on file   Social History Narrative  . No narrative on file   Review of Systems  Constitutional: Negative for fever and weight loss.  HENT: Negative for ear discharge, ear pain, hearing loss and tinnitus.   Eyes: Negative for blurred vision, double vision, photophobia and pain.  Respiratory: Negative for cough and shortness of breath.   Cardiovascular: Negative for chest pain and palpitations.  Gastrointestinal: Negative for abdominal pain, blood in stool, constipation, diarrhea, heartburn, melena, nausea and vomiting.  Genitourinary: Positive for frequency. Negative for dysuria, flank pain, hematuria and urgency.  Musculoskeletal: Negative for falls.  Skin: Positive for itching and rash.  Neurological: Negative for dizziness, loss of consciousness and headaches.  Endo/Heme/Allergies: Negative for environmental allergies.  Psychiatric/Behavioral: Negative for depression, hallucinations, substance abuse and suicidal ideas. The patient is not nervous/anxious and does not have insomnia.    BP 122/82   Temp 98.2 F (36.8 C) (Oral)   Resp 14   Ht  (1.626 m)   Wt 297 lb (134.7 kg)   LMP 04/21/2016   SpO2 96%   BMI 50.98 kg/m   Physical Exam  Constitutional: She is oriented to person, place, and time and well-developed, well-nourished, and in no  distress.  HENT:  Head: Normocephalic and atraumatic.  Right Ear: Tympanic membrane, external ear and ear canal normal.  Left Ear: Tympanic membrane, external ear and ear canal normal.  Nose: Nose normal. No mucosal edema.  Mouth/Throat: Uvula is midline, oropharynx is clear and moist and mucous membranes are normal. No oropharyngeal exudate or posterior oropharyngeal erythema.  Eyes: Conjunctivae are normal. Pupils are equal, round, and reactive to light.  Neck: Neck supple. No thyromegaly present.  Cardiovascular: Normal rate, regular rhythm, normal heart sounds and intact distal pulses.   Pulmonary/Chest: Effort normal and breath sounds normal. No respiratory distress. She has no wheezes. She has no rales. She exhibits no tenderness.  Abdominal: Soft. Bowel sounds are normal.  She exhibits no distension and no mass. There is no tenderness. There is no rebound and no guarding.  Lymphadenopathy:    She has no cervical adenopathy.  Neurological: She is alert and oriented to person, place, and time. No cranial nerve deficit.  Skin: Skin is warm and dry. No rash noted.  Psychiatric: Affect normal.  Vitals reviewed.  Assessment/Plan: Visit for preventive health examination Depression screen negative. Health Maintenance reviewed. Preventive schedule discussed and handout given in AVS. Will obtain fasting labs today.   Rash and nonspecific skin eruption Intermittent. Occurring with hot showers. Asymptomatic at present. Patient to take photo when rash is present so that we can assess.  Frequency of urination Mild. Noted on ROS. No other symptoms Will check UA with micro. Add culture if indicated.    Piedad Climes, PA-C

## 2016-05-05 NOTE — Assessment & Plan Note (Signed)
Intermittent. Occurring with hot showers. Asymptomatic at present. Patient to take photo when rash is present so that we can assess.

## 2016-05-05 NOTE — Assessment & Plan Note (Signed)
Depression screen negative. Health Maintenance reviewed. Preventive schedule discussed and handout given in AVS. Will obtain fasting labs today.  

## 2016-05-06 ENCOUNTER — Encounter: Payer: Self-pay | Admitting: Physician Assistant

## 2016-05-07 ENCOUNTER — Encounter: Payer: Self-pay | Admitting: Physician Assistant

## 2016-05-07 LAB — URINE CULTURE

## 2016-05-08 ENCOUNTER — Encounter: Payer: Self-pay | Admitting: Physician Assistant

## 2016-05-08 MED ORDER — CIPROFLOXACIN HCL 500 MG PO TABS: 500.0000 mg | ORAL_TABLET | Freq: Two times a day (BID) | ORAL | 0 refills | Status: DC

## 2016-06-01 ENCOUNTER — Encounter: Payer: Self-pay | Admitting: Physician Assistant

## 2016-06-02 ENCOUNTER — Encounter: Payer: Self-pay | Admitting: Physician Assistant

## 2016-06-02 ENCOUNTER — Ambulatory Visit (INDEPENDENT_AMBULATORY_CARE_PROVIDER_SITE_OTHER): Payer: BLUE CROSS/BLUE SHIELD | Admitting: Physician Assistant

## 2016-06-02 VITALS — BP 116/82 | HR 110 | Temp 98.8°F | Resp 14 | Ht 64.0 in | Wt 294.0 lb

## 2016-06-02 DIAGNOSIS — R300 Dysuria: Secondary | ICD-10-CM

## 2016-06-02 LAB — POCT URINALYSIS DIPSTICK
Bilirubin, UA: NEGATIVE
Glucose, UA: NEGATIVE
Ketones, UA: NEGATIVE
Leukocytes, UA: NEGATIVE
Nitrite, UA: NEGATIVE
Protein, UA: 0.3
Spec Grav, UA: >=1.03 — AB (ref 1.010–1.025)
Urobilinogen, UA: 0.2 E.U./dL
pH, UA: 6 (ref 5.0–8.0)

## 2016-06-02 MED ORDER — CEPHALEXIN 500 MG PO CAPS: 500.0000 mg | ORAL_CAPSULE | Freq: Two times a day (BID) | ORAL | 0 refills | Status: AC

## 2016-06-02 NOTE — Patient Instructions (Signed)
Your symptoms are consistent with a bladder infection, also called acute cystitis. Please take your antibiotic (Keflex) as directed until all pills are gone.  Stay very well hydrated.  Consider a daily probiotic (Align, Culturelle, or Activia) to help prevent stomach upset caused by the antibiotic.  Taking a probiotic daily may also help prevent recurrent UTIs.  Also consider taking AZO (Phenazopyridine) tablets to help decrease pain with urination.  I will call you with your urine testing results.  We will change antibiotics if indicated.  Call or return to clinic if symptoms are not resolved by completion of antibiotic.   We may need a repeat assessment by Urology if this continues to be an issue.   Urinary Tract Infection A urinary tract infection (UTI) can occur any place along the urinary tract. The tract includes the kidneys, ureters, bladder, and urethra. A type of germ called bacteria often causes a UTI. UTIs are often helped with antibiotic medicine.  HOME CARE   If given, take antibiotics as told by your doctor. Finish them even if you start to feel better.  Drink enough fluids to keep your pee (urine) clear or pale yellow.  Avoid tea, drinks with caffeine, and bubbly (carbonated) drinks.  Pee often. Avoid holding your pee in for a long time.  Pee before and after having sex (intercourse).  Wipe from front to back after you poop (bowel movement) if you are a woman. Use each tissue only once. GET HELP RIGHT AWAY IF:   You have back pain.  You have lower belly (abdominal) pain.  You have chills.  You feel sick to your stomach (nauseous).  You throw up (vomit).  Your burning or discomfort with peeing does not go away.  You have a fever.  Your symptoms are not better in 3 days. MAKE SURE YOU:   Understand these instructions.  Will watch your condition.  Will get help right away if you are not doing well or get worse. Document Released: 07/01/2007 Document Revised:  10/07/2011 Document Reviewed: 08/13/2011 [facility] Patient Information 2015 [place]Care, [place]. This information is not intended to replace advice given to you by your health care provider. Make sure you discuss any questions you have with your health care provider.

## 2016-06-02 NOTE — Progress Notes (Signed)
Pre visit review using our clinic review tool, if applicable. No additional management support is needed unless otherwise documented below in the visit note. 

## 2016-06-03 LAB — URINE CULTURE

## 2016-06-03 NOTE — Progress Notes (Signed)
Patient presents to clinic today c/o 2 days of urinary urgency, frequency and dysuria. Patient with history of significant UTI, most recent UTI from Klebsiella 1 month prior, treated with Ciprofloxacin. Denies fever, chills, flank pain. Notes nausea but denies vomiting. Per patient she is currently on her menstrual period.  Past Medical History:  Diagnosis Date  . Allergy   . Anxiety   . Depression   . GERD (gastroesophageal reflux disease)   . IBS (irritable bowel syndrome)   . Migraine   . Overactive bladder   . PCOS (polycystic ovarian syndrome)   . Pelvic pain   . Pseudotumor cerebri   . SUI (stress urinary incontinence, female)   . Urinary incontinence     Current Outpatient Prescriptions on File Prior to Visit  Medication Sig Dispense Refill  . acetaminophen (TYLENOL) 500 MG tablet Take 500 mg by mouth every 6 (six) hours as needed.    Marland Kitchen amitriptyline (ELAVIL) 25 MG tablet Take 50 mg by mouth at bedtime.     Marland Kitchen azelastine (ASTELIN) 0.1 % nasal spray Place 2 sprays into both nostrils 2 (two) times daily. Use in each nostril as directed 30 mL 12  . butalbital-acetaminophen-caffeine (FIORICET, ESGIC) 50-325-40 MG per tablet Take 1 tablet by mouth 2 (two) times daily as needed for headache.    . dicyclomine (BENTYL) 10 MG capsule Take 10 mg by mouth as needed for spasms.    . fluticasone (FLONASE) 50 MCG/ACT nasal spray Place 2 sprays into both nostrils daily.    Marland Kitchen levocetirizine (XYZAL) 5 MG tablet Take 5 mg by mouth every evening.  5  . LORazepam (ATIVAN) 1 MG tablet TAKE 1 TABLET BY MOUTH ONCE DAILY AS NEEDED 30 tablet 0  . Melatonin 10 MG TABS Take 1 tablet by mouth at bedtime.    . Multiple Vitamin (MULTIVITAMIN) tablet Take 1 tablet by mouth daily.    . Omega-3 Fatty Acids (FISH OIL) 1200 MG CAPS Take 1 capsule by mouth daily.    Marland Kitchen omeprazole (PRILOSEC) 40 MG capsule Take 40 mg by mouth daily.    . Probiotic Product (PROBIOTIC DAILY PO) Take 1 tablet by mouth daily.    Marland Kitchen  spironolactone (ALDACTONE) 100 MG tablet Take 100 mg by mouth daily.  4  . SPRINTEC 28 0.25-35 MG-MCG tablet Take 1 tablet by mouth daily.  4  . SUMAtriptan (IMITREX) 50 MG tablet Take 50 mg by mouth every 2 (two) hours as needed for migraine. May repeat in 2 hours if headache persists or recurs.    . Suvorexant (BELSOMRA) 10 MG TABS Take 10 mg by mouth at bedtime. 30 tablet 1  . venlafaxine XR (EFFEXOR-XR) 150 MG 24 hr capsule Take 150 mg by mouth daily with breakfast.     No current facility-administered medications on file prior to visit.     No Known Allergies  Family History  Problem Relation Age of Onset  . Hypertension Mother   . Depression Mother   . Hypertension Father   . Cancer Father     kidney  . Depression Father   . Cancer Maternal Uncle     stomach  . Hypertension Maternal Grandmother   . Cancer Maternal Grandmother     kidney, bone  . Hypertension Maternal Grandfather   . Cancer Maternal Grandfather     lung, bone  . Stroke Maternal Grandfather   . Hypertension Paternal Grandmother   . Hypertension Paternal Grandfather   . Stroke Paternal Grandfather  Social History   Social History  . Marital status: Married    Spouse name: N/A  . Number of children: N/A  . Years of education: N/A   Occupational History  . [employer]   Social History Main Topics  . Smoking status: Never Smoker  . Smokeless tobacco: Never Used  . Alcohol use No  . Drug use: No  . Sexual activity: Yes    Birth control/ protection: Surgical   Other Topics Concern  . None   Social History Narrative  . None   Review of Systems - See HPI.  All other ROS are negative.  BP 116/82   Pulse (!) 110   Temp 98.8 F (37.1 C) (Oral)   Resp 14   Ht 5\' 4"  (1.626 m)   Wt 294 lb (133.4 kg)   SpO2 98%   BMI 50.46 kg/m   Physical Exam  Constitutional: She is oriented to person, place, and time and well-developed, well-nourished, and in no distress.  HENT:  Head:  Normocephalic and atraumatic.  Eyes: Conjunctivae are normal.  Neck: Neck supple.  Cardiovascular: Normal rate, regular rhythm, normal heart sounds and intact distal pulses.   Pulmonary/Chest: Effort normal and breath sounds normal. No respiratory distress. She has no wheezes. She has no rales. She exhibits no tenderness.  Abdominal: Soft. Bowel sounds are normal. She exhibits no distension. There is no tenderness.  Neurological: She is alert and oriented to person, place, and time.  Skin: Skin is warm and dry. No rash noted.  Vitals reviewed.   Recent Results (from the past 2160 hour(s))  CBC     Status: None   Collection Time: 05/05/16 10:19 AM  Result Value Ref Range   WBC 9.2 4.0 - 10.5 K/uL   RBC 4.41 3.87 - 5.11 Mil/uL   Platelets 346.0 150.0 - 400.0 K/uL   Hemoglobin 13.8 12.0 - 15.0 g/dL   HCT 21.3 08.6 - 57.8 %   MCV 94.1 78.0 - 100.0 fl   MCHC 33.3 30.0 - 36.0 g/dL   RDW 46.9 62.9 - 52.8 %  Comprehensive metabolic panel     Status: Abnormal   Collection Time: 05/05/16 10:19 AM  Result Value Ref Range   Sodium 138 135 - 145 mEq/L   Potassium 4.4 3.5 - 5.1 mEq/L   Chloride 104 96 - 112 mEq/L   CO2 25 19 - 32 mEq/L   Glucose, Bld 102 (H) 70 - 99 mg/dL   BUN 11 6 - 23 mg/dL   Creatinine, Ser 4.13 0.40 - 1.20 mg/dL   Total Bilirubin 0.3 0.2 - 1.2 mg/dL   Alkaline Phosphatase 50 39 - 117 U/L   AST 15 0 - 37 U/L   ALT 17 0 - 35 U/L   Total Protein 6.6 6.0 - 8.3 g/dL   Albumin 3.7 3.5 - 5.2 g/dL   Calcium 9.0 8.4 - 24.4 mg/dL   GFR 01.02 >72.53 mL/min  Hemoglobin A1c     Status: None   Collection Time: 05/05/16 10:19 AM  Result Value Ref Range   Hgb A1c MFr Bld 5.7 4.6 - 6.5 %    Comment: Glycemic Control Guidelines for People with Diabetes:Non Diabetic:  <6%Goal of Therapy: <7%Additional Action Suggested:  >8%   TSH     Status: None   Collection Time: 05/05/16 10:19 AM  Result Value Ref Range   TSH 1.25 0.35 - 4.50 uIU/mL  Urinalysis, Routine w reflex microscopic      Status: Abnormal  Collection Time: 05/05/16 10:19 AM  Result Value Ref Range   Color, Urine YELLOW Yellow;Lt. Yellow   APPearance Sl Cloudy (A) Clear   Specific Gravity, Urine 1.025 1.000 - 1.030   pH 6.0 5.0 - 8.0   Total Protein, Urine NEGATIVE Negative   Urine Glucose NEGATIVE Negative   Ketones, ur NEGATIVE Negative   Bilirubin Urine NEGATIVE Negative   Hgb urine dipstick SMALL (A) Negative   Urobilinogen, UA 0.2 0.0 - 1.0   Leukocytes, UA NEGATIVE Negative   Nitrite NEGATIVE Negative   WBC, UA 3-6/hpf (A) 0-2/hpf   RBC / HPF 0-2/hpf 0-2/hpf   Mucus, UA Presence of (A) None   Squamous Epithelial / LPF Few(5-10/hpf) (A) Rare(0-4/hpf)   Bacteria, UA Few(10-50/hpf) (A) None  Lipid panel     Status: None   Collection Time: 05/05/16 10:19 AM  Result Value Ref Range   Cholesterol 121 0 - 200 mg/dL    Comment: ATP III Classification       Desirable:  < 200 mg/dL               Borderline High:  200 - 239 mg/dL          High:  > = 161 mg/dL   Triglycerides 096.0 0.0 - 149.0 mg/dL    Comment: Normal:  <454 mg/dLBorderline High:  150 - 199 mg/dL   HDL 09.81 >19.14 mg/dL   VLDL 78.2 0.0 - 95.6 mg/dL   LDL Cholesterol 45 0 - 99 mg/dL   Total CHOL/HDL Ratio 2     Comment:                Men          Women1/2 Average Risk     3.4          3.3Average Risk          5.0          4.42X Average Risk          9.6          7.13X Average Risk          15.0          11.0                       NonHDL 70.96     Comment: NOTE:  Non-HDL goal should be 30 mg/dL higher than patient's LDL goal (i.e. LDL goal of < 70 mg/dL, would have non-HDL goal of < 100 mg/dL)  Urine Culture     Status: None   Collection Time: 05/05/16 10:25 AM  Result Value Ref Range   Culture KLEBSIELLA PNEUMONIAE    Colony Count Greater than 100,000 CFU/mL    Organism ID, Bacteria KLEBSIELLA PNEUMONIAE       Susceptibility   Klebsiella pneumoniae -  (no method available)    AMPICILLIN  Resistant     AMOX/CLAVULANIC 4  Sensitive     AMPICILLIN/SULBACTAM <=2 Sensitive     PIP/TAZO <=4 Sensitive     IMIPENEM <=0.25 Sensitive     CEFAZOLIN <=4 Not Reportable     CEFTRIAXONE <=1 Sensitive     CEFTAZIDIME <=1 Sensitive     CEFEPIME <=1 Sensitive     GENTAMICIN <=1 Sensitive     TOBRAMYCIN <=1 Sensitive     CIPROFLOXACIN <=0.25 Sensitive     LEVOFLOXACIN <=0.12 Sensitive     NITROFURANTOIN <=16 Sensitive     TRIMETH/SULFA* <=20 Sensitive      *  NR=NOT REPORTABLE,SEE COMMENTORAL therapy:A cefazolin MIC of <32 predicts susceptibility to the oral agents cefaclor,cefdinir,cefpodoxime,cefprozil,cefuroxime,cephalexin,and loracarbef when used for therapy of uncomplicated UTIs due to E.coli,K.pneumomiae,and P.mirabilis. PARENTERAL therapy: A cefazolinMIC of >8 indicates resistance to parenteralcefazolin. An alternate test method must beperformed to confirm susceptibility to parenteralcefazolin.  POCT Urinalysis Dipstick     Status: Abnormal   Collection Time: 06/02/16  4:13 PM  Result Value Ref Range   Color, UA brown    Clarity, UA cloudy    Glucose, UA negative    Bilirubin, UA negative    Ketones, UA negative    Spec Grav, UA >=1.030 (A) 1.010 - 1.025   Blood, UA 3+    pH, UA 6.0 5.0 - 8.0   Protein, UA 0.3    Urobilinogen, UA 0.2 0.2 or 1.0 E.U./dL   Nitrite, UA negative    Leukocytes, UA Negative Negative    Assessment/Plan: 1. Dysuria Urine dip unremarkable except for blood. Patient currently on period. Will send for culture. Giving history of frequent UTI, will start empiric treatment for Keflex pending culture. Supportive measures and OTC medications reviewed. Discussed potential need to re-establish with urology. Patient will give some thought to this.  - POCT Urinalysis Dipstick - Urine culture   Piedad ClimesMartin, Melisa Donofrio Cody, PA-C

## 2016-06-05 ENCOUNTER — Encounter: Payer: Self-pay | Admitting: Emergency Medicine

## 2016-06-09 ENCOUNTER — Encounter: Payer: Self-pay | Admitting: Physician Assistant

## 2016-06-09 MED ORDER — AMITRIPTYLINE HCL 25 MG PO TABS: 50.0000 mg | ORAL_TABLET | Freq: Every day | ORAL | 5 refills | Status: DC

## 2016-06-19 ENCOUNTER — Encounter: Payer: Self-pay | Admitting: Physician Assistant

## 2016-06-19 MED ORDER — VENLAFAXINE HCL ER 150 MG PO CP24: 150.0000 mg | ORAL_CAPSULE | Freq: Every day | ORAL | 1 refills | Status: DC

## 2016-07-01 ENCOUNTER — Other Ambulatory Visit: Payer: Self-pay | Admitting: Physician Assistant

## 2016-07-02 NOTE — Telephone Encounter (Signed)
Belsomra last rx #30 1 RF CSC-02/19/16 UDS-03/19/16 low risk Last office visit-05/05/16 CPE  Please advise in PCP absence

## 2016-07-02 NOTE — Telephone Encounter (Signed)
Rx faxed to the [facility] pharmacy 

## 2016-07-30 ENCOUNTER — Other Ambulatory Visit: Payer: Self-pay | Admitting: Family Medicine

## 2016-07-31 NOTE — Telephone Encounter (Signed)
Belsomra last rx 07/02/16 #30  CSC: 03/17/16 UDS: 03/17/16 Last OV: 06/02/16

## 2016-07-31 NOTE — Telephone Encounter (Signed)
Rx faxed to the [facility] pharmacy 

## 2016-08-31 ENCOUNTER — Ambulatory Visit (INDEPENDENT_AMBULATORY_CARE_PROVIDER_SITE_OTHER): Payer: BLUE CROSS/BLUE SHIELD | Admitting: Physician Assistant

## 2016-08-31 ENCOUNTER — Encounter: Payer: Self-pay | Admitting: Physician Assistant

## 2016-08-31 VITALS — BP 112/80 | HR 96 | Temp 99.2°F | Resp 14 | Ht 64.0 in | Wt 296.0 lb

## 2016-08-31 DIAGNOSIS — R300 Dysuria: Secondary | ICD-10-CM | POA: Diagnosis not present

## 2016-08-31 LAB — POCT URINALYSIS DIPSTICK
Bilirubin, UA: NEGATIVE
Glucose, UA: NEGATIVE
Ketones, UA: NEGATIVE
Nitrite, UA: NEGATIVE
Protein, UA: NEGATIVE
Spec Grav, UA: 1.025 (ref 1.010–1.025)
Urobilinogen, UA: 0.2 E.U./dL
pH, UA: 6 (ref 5.0–8.0)

## 2016-08-31 MED ORDER — CEPHALEXIN 500 MG PO CAPS: 500.0000 mg | ORAL_CAPSULE | Freq: Two times a day (BID) | ORAL | 0 refills | Status: AC

## 2016-08-31 NOTE — Patient Instructions (Signed)
Your symptoms are consistent with a bladder infection, also called acute cystitis. Please take your antibiotic (Keflex) as directed until all pills are gone.  Stay very well hydrated.  Consider a daily probiotic (Align, Culturelle, or Activia) to help prevent stomach upset caused by the antibiotic.  Taking a probiotic daily may also help prevent recurrent UTIs.  Also consider taking AZO (Phenazopyridine) tablets to help decrease pain with urination.  I will call you with your urine testing results.  We will change antibiotics if indicated.  Call or return to clinic if symptoms are not resolved by completion of antibiotic.   Urinary Tract Infection A urinary tract infection (UTI) can occur any place along the urinary tract. The tract includes the kidneys, ureters, bladder, and urethra. A type of germ called bacteria often causes a UTI. UTIs are often helped with antibiotic medicine.  HOME CARE   If given, take antibiotics as told by your doctor. Finish them even if you start to feel better.  Drink enough fluids to keep your pee (urine) clear or pale yellow.  Avoid tea, drinks with caffeine, and bubbly (carbonated) drinks.  Pee often. Avoid holding your pee in for a long time.  Pee before and after having sex (intercourse).  Wipe from front to back after you poop (bowel movement) if you are a woman. Use each tissue only once. GET HELP RIGHT AWAY IF:   You have back pain.  You have lower belly (abdominal) pain.  You have chills.  You feel sick to your stomach (nauseous).  You throw up (vomit).  Your burning or discomfort with peeing does not go away.  You have a fever.  Your symptoms are not better in 3 days. MAKE SURE YOU:   Understand these instructions.  Will watch your condition.  Will get help right away if you are not doing well or get worse. Document Released: 07/01/2007 Document Revised: 10/07/2011 Document Reviewed: 08/13/2011 ExitCare Patient Information 2015  [employer]. This information is not intended to replace advice given to you by your health care provider. Make sure you discuss any questions you have with your health care provider.   

## 2016-08-31 NOTE — Progress Notes (Signed)
Pre visit review using our clinic review tool, if applicable. No additional management support is needed unless otherwise documented below in the visit note. 

## 2016-08-31 NOTE — Progress Notes (Signed)
Patient presents to clinic today c/o 6 days of low back pain, dysuria, frequency and urgency. Patient endorses some suprapubic pressure. Denies fever, chills, nausea or vomiting. Denies flank pain. Is trying to hydrate well.   Past Medical History:  Diagnosis Date  . Allergy   . Anxiety   . Depression   . GERD (gastroesophageal reflux disease)   . IBS (irritable bowel syndrome)   . Migraine   . Overactive bladder   . PCOS (polycystic ovarian syndrome)   . Pelvic pain   . Pseudotumor cerebri   . SUI (stress urinary incontinence, female)   . Urinary incontinence     Current Outpatient Prescriptions on File Prior to Visit  Medication Sig Dispense Refill  . acetaminophen (TYLENOL) 500 MG tablet Take 500 mg by mouth every 6 (six) hours as needed.    Marland Kitchen amitriptyline (ELAVIL) 25 MG tablet Take 2 tablets (50 mg total) by mouth at bedtime. 60 tablet 5  . BELSOMRA 10 MG TABS TAKE 1 TABLET BY MOUTH AT BEDTIME 30 tablet 1  . levocetirizine (XYZAL) 5 MG tablet Take 5 mg by mouth every evening.  5  . Melatonin 10 MG TABS Take 1 tablet by mouth at bedtime.    . Multiple Vitamin (MULTIVITAMIN) tablet Take 1 tablet by mouth daily.    Marland Kitchen spironolactone (ALDACTONE) 100 MG tablet Take 100 mg by mouth daily.  4  . SPRINTEC 28 0.25-35 MG-MCG tablet Take 1 tablet by mouth daily.  4  . venlafaxine XR (EFFEXOR-XR) 150 MG 24 hr capsule Take 1 capsule (150 mg total) by mouth daily with breakfast. 90 capsule 1  . butalbital-acetaminophen-caffeine (FIORICET, ESGIC) 50-325-40 MG per tablet Take 1 tablet by mouth 2 (two) times daily as needed for headache.    . dicyclomine (BENTYL) 10 MG capsule Take 10 mg by mouth as needed for spasms.    . fluticasone (FLONASE) 50 MCG/ACT nasal spray Place 2 sprays into both nostrils daily.    . Omega-3 Fatty Acids (FISH OIL) 1200 MG CAPS Take 1 capsule by mouth daily.    . SUMAtriptan (IMITREX) 50 MG tablet Take 50 mg by mouth every 2 (two) hours as needed for migraine. May  repeat in 2 hours if headache persists or recurs.     No current facility-administered medications on file prior to visit.     No Known Allergies  Family History  Problem Relation Age of Onset  . Hypertension Mother   . Depression Mother   . Hypertension Father   . Cancer Father        kidney  . Depression Father   . Cancer Maternal Uncle        stomach  . Hypertension Maternal Grandmother   . Cancer Maternal Grandmother        kidney, bone  . Hypertension Maternal Grandfather   . Cancer Maternal Grandfather        lung, bone  . Stroke Maternal Grandfather   . Hypertension Paternal Grandmother   . Hypertension Paternal Grandfather   . Stroke Paternal Grandfather     Social History   Social History  . Marital status: Married    Spouse name: N/A  . Number of children: N/A  . Years of education: N/A   Occupational History  . [employer]   Social History Main Topics  . Smoking status: Never Smoker  . Smokeless tobacco: Never Used  . Alcohol use No  . Drug use: No  . Sexual activity: Yes  Birth control/ protection: Surgical   Other Topics Concern  . None   Social History Narrative  . None   Review of Systems - See HPI.  All other ROS are negative.  BP 112/80   Pulse 96   Temp 99.2 F (37.3 C) (Oral)   Resp 14   Ht 5\' 4"  (1.626 m)   Wt 296 lb (134.3 kg)   SpO2 98%   BMI 50.81 kg/m   Physical Exam  Constitutional: She is oriented to person, place, and time and well-developed, well-nourished, and in no distress.  HENT:  Head: Normocephalic and atraumatic.  Eyes: Conjunctivae are normal.  Neck: Neck supple.  Cardiovascular: Normal rate, regular rhythm, normal heart sounds and intact distal pulses.   Pulmonary/Chest: Effort normal and breath sounds normal. No respiratory distress. She has no wheezes. She has no rales. She exhibits no tenderness.  Abdominal: Soft. Bowel sounds are normal. She exhibits no distension. There is no tenderness.    Negative CVA tenderness  Neurological: She is alert and oriented to person, place, and time.  Skin: Skin is warm and dry. No rash noted.  Psychiatric: Affect normal.  Vitals reviewed.  Recent Results (from the past 2160 hour(s))  POCT Urinalysis Dipstick     Status: Abnormal   Collection Time: 06/02/16  4:13 PM  Result Value Ref Range   Color, UA brown    Clarity, UA cloudy    Glucose, UA negative    Bilirubin, UA negative    Ketones, UA negative    Spec Grav, UA >=1.030 (A) 1.010 - 1.025   Blood, UA 3+    pH, UA 6.0 5.0 - 8.0   Protein, UA 0.3    Urobilinogen, UA 0.2 0.2 or 1.0 E.U./dL   Nitrite, UA negative    Leukocytes, UA Negative Negative  Urine culture     Status: None   Collection Time: 06/02/16  4:41 PM  Result Value Ref Range   Organism ID, Bacteria      Three or more organisms present,each greater than 10,000 CFU/mL.These organisms,commonly found on external and internal genitalia,are considered to be colonizers.No further testing performed.   POCT Urinalysis Dipstick     Status: Abnormal   Collection Time: 08/31/16  2:38 PM  Result Value Ref Range   Color, UA yellow    Clarity, UA cloudy    Glucose, UA negative    Bilirubin, UA negative    Ketones, UA negative    Spec Grav, UA 1.025 1.010 - 1.025   Blood, UA 1+    pH, UA 6.0 5.0 - 8.0   Protein, UA negative    Urobilinogen, UA 0.2 0.2 or 1.0 E.U./dL   Nitrite, UA negative    Leukocytes, UA Small (1+) (A) Negative    Assessment/Plan: 1. Dysuria Urine dip with LE and trace blood. Giving low-grade temp and classic symptoms will start ABX. Rx Keflex. Culture sent. Will alter based on sensitivities. Increase fluid. Pyridium and Tagamet as directed.  - POCT Urinalysis Dipstick - cephALEXin (KEFLEX) 500 MG capsule; Take 1 capsule (500 mg total) by mouth 2 (two) times daily.  Dispense: 10 capsule; Refill: 0 - Urine Culture   Piedad ClimesMartin, Manan Olmo Cody, PA-C

## 2016-09-03 LAB — URINE CULTURE

## 2016-10-06 ENCOUNTER — Encounter: Payer: Self-pay | Admitting: Family Medicine

## 2016-10-06 ENCOUNTER — Ambulatory Visit (INDEPENDENT_AMBULATORY_CARE_PROVIDER_SITE_OTHER): Payer: BLUE CROSS/BLUE SHIELD | Admitting: Family Medicine

## 2016-10-06 VITALS — BP 110/70 | HR 110 | Temp 98.7°F | Wt 294.8 lb

## 2016-10-06 DIAGNOSIS — B9789 Other viral agents as the cause of diseases classified elsewhere: Secondary | ICD-10-CM

## 2016-10-06 DIAGNOSIS — J069 Acute upper respiratory infection, unspecified: Secondary | ICD-10-CM

## 2016-10-06 DIAGNOSIS — H6502 Acute serous otitis media, left ear: Secondary | ICD-10-CM | POA: Diagnosis not present

## 2016-10-06 MED ORDER — HYDROCOD POLST-CPM POLST ER 10-8 MG/5ML PO SUER: 5.0000 mL | Freq: Two times a day (BID) | ORAL | 0 refills | Status: DC | PRN

## 2016-10-06 NOTE — Progress Notes (Signed)
Subjective:     Patient ID: [name], female   DOB: 1985/07/01, 31 y.o.   MRN: [id number]  HPI Patient is a nonsmoker seen as a work and with five-day history of cough. Has also had some mild left ear pain. She's had nasal congestion. Possible low-grade fever initially but none now. She had mild sore throat initially. Cough is mostly nonproductive. Worse at night. Cough has been severe at times. Not relieved with over-the-counter medications.  Past Medical History:  Diagnosis Date  . Allergy   . Anxiety   . Depression   . GERD (gastroesophageal reflux disease)   . IBS (irritable bowel syndrome)   . Migraine   . Overactive bladder   . PCOS (polycystic ovarian syndrome)   . Pelvic pain   . Pseudotumor cerebri   . SUI (stress urinary incontinence, female)   . Urinary incontinence    Past Surgical History:  Procedure Laterality Date  . CESAREAN SECTION  2009  &  2011   2011 W/ BILATERAL TUBAL LIGATION  . CHOLECYSTECTOMY  2010  . CYSTO WITH HYDRODISTENSION  03/01/2012   Procedure: CYSTOSCOPY/HYDRODISTENSION;  Surgeon: Martina SinnerScott A MacDiarmid, MD;  [facility];  Service: Urology;  Laterality: N/A;  INSTILLATION of marcaine and pyridium.  . PUBOVAGINAL SLING N/A 02/14/2013   Procedure: PUBO-VAGINAL SLING AND HYDRODISTENSION;  Surgeon: Martina SinnerScott A MacDiarmid, MD;  [facility];  Service: Urology;  Laterality: N/A;  . TRANSTHORACIC ECHOCARDIOGRAM  06/2009  . TUBAL LIGATION      reports that she has never smoked. She has never used smokeless tobacco. She reports that she does not drink alcohol or use drugs. family history includes Cancer in her father, maternal grandfather, maternal grandmother, and maternal uncle; Depression in her father and mother; Hypertension in her father, maternal grandfather, maternal grandmother, mother, paternal grandfather, and paternal grandmother; Stroke in her maternal grandfather and paternal grandfather. No Known  Allergies   Review of Systems  Constitutional: Negative for chills and fever.  HENT: Positive for congestion. Negative for sinus pressure.   Respiratory: Positive for cough. Negative for shortness of breath.        Objective:   Physical Exam  Constitutional: She appears well-developed and well-nourished.  HENT:  Right Ear: External ear normal.  Mouth/Throat: Oropharynx is clear and moist.  She has small left-sided serous effusion. No bulging. Tympanic membrane intact  She has fairly large tonsils but they're symmetric with no erythema no exudate  Neck: Neck supple.  Cardiovascular: Normal rate and regular rhythm.   Pulmonary/Chest: Effort normal and breath sounds normal. No respiratory distress. She has no wheezes. She has no rales.  Lymphadenopathy:    She has no cervical adenopathy.       Assessment:     Probable viral URI with cough. She has evidence for left serous otitis    Plan:     -Limited Tussionex 1 teaspoon daily at bedtime for severe cough. She has tolerated this well in the past -Follow-up immediately for any fever or increasing shortness of breath -Stay well-hydrated  Kristian CoveyBruce W Cris Talavera MD [facility]

## 2016-10-06 NOTE — Patient Instructions (Signed)

## 2016-10-19 ENCOUNTER — Other Ambulatory Visit: Payer: Self-pay | Admitting: Physician Assistant

## 2016-10-20 NOTE — Telephone Encounter (Signed)
Rx faxed to the [facility] pharmacy 

## 2016-12-24 ENCOUNTER — Encounter: Payer: Self-pay | Admitting: Nurse Practitioner

## 2016-12-24 ENCOUNTER — Ambulatory Visit (INDEPENDENT_AMBULATORY_CARE_PROVIDER_SITE_OTHER): Payer: BLUE CROSS/BLUE SHIELD | Admitting: Nurse Practitioner

## 2016-12-24 VITALS — BP 116/76 | HR 105 | Temp 97.8°F | Ht 64.0 in | Wt 285.0 lb

## 2016-12-24 DIAGNOSIS — M545 Low back pain, unspecified: Secondary | ICD-10-CM

## 2016-12-24 DIAGNOSIS — R1030 Lower abdominal pain, unspecified: Secondary | ICD-10-CM | POA: Diagnosis not present

## 2016-12-24 DIAGNOSIS — R110 Nausea: Secondary | ICD-10-CM

## 2016-12-24 MED ORDER — ONDANSETRON HCL 4 MG PO TABS
4.0000 mg | ORAL_TABLET | Freq: Three times a day (TID) | ORAL | 0 refills | Status: DC | PRN
Start: 2016-12-24 — End: 2018-04-29

## 2016-12-24 NOTE — Progress Notes (Signed)
Subjective:  Patient ID: [name], female    DOB: 02-26-1985  Age: 31 y.o. MRN: [id number]  CC: Pyelonephritis (lower back pain,nausea--had blood in urine (dip urine at work)-- 3 days--going to [place] next [place]. )   Abdominal Pain  This is a new problem. The current episode started in the past 7 days. The onset quality is gradual. The problem occurs constantly. The problem has been unchanged. The pain is located in the suprapubic region. The quality of the pain is dull. The abdominal pain radiates to the right flank. Associated symptoms include anorexia and nausea. Pertinent negatives include no belching, constipation, diarrhea, dysuria, fever, frequency, hematochezia, hematuria, melena, vomiting or weight loss. Nothing aggravates the pain. The pain is relieved by nothing. She has tried nothing for the symptoms.   abnormal urine odor, no vaginal discharge, no pain with intercourse s/pTubal ligation. S/p cholescystectomy.  Outpatient Medications Prior to Visit  Medication Sig Dispense Refill  . acetaminophen (TYLENOL) 500 MG tablet Take 500 mg by mouth every 6 (six) hours as needed.    Marland Kitchen. amitriptyline (ELAVIL) 25 MG tablet Take 2 tablets (50 mg total) by mouth at bedtime. 60 tablet 5  . BELSOMRA 10 MG TABS TAKE 1 TABLET BY MOUTH EVERY EVENING AT BEDTIME 30 tablet 1  . butalbital-acetaminophen-caffeine (FIORICET, ESGIC) 50-325-40 MG per tablet Take 1 tablet by mouth 2 (two) times daily as needed for headache.    . cimetidine (TAGAMET) 200 MG tablet Take 200 mg by mouth 2 (two) times daily.    Marland Kitchen. dicyclomine (BENTYL) 10 MG capsule Take 10 mg by mouth as needed for spasms.    . fluticasone (FLONASE) 50 MCG/ACT nasal spray Place 2 sprays into both nostrils daily.    Marland Kitchen. levocetirizine (XYZAL) 5 MG tablet Take 5 mg by mouth every evening.  5  . Melatonin 10 MG TABS Take 1 tablet by mouth at bedtime.    . Multiple Vitamin (MULTIVITAMIN) tablet Take 1 tablet by mouth daily.    Marland Kitchen. spironolactone  (ALDACTONE) 100 MG tablet Take 100 mg by mouth daily.  4  . SPRINTEC 28 0.25-35 MG-MCG tablet Take 1 tablet by mouth daily.  4  . SUMAtriptan (IMITREX) 50 MG tablet Take 50 mg by mouth every 2 (two) hours as needed for migraine. May repeat in 2 hours if headache persists or recurs.    . venlafaxine XR (EFFEXOR-XR) 150 MG 24 hr capsule Take 1 capsule (150 mg total) by mouth daily with breakfast. 90 capsule 1  . chlorpheniramine-HYDROcodone (TUSSIONEX PENNKINETIC ER) 10-8 MG/5ML SUER Take 5 mLs by mouth every 12 (twelve) hours as needed for cough. (Patient not taking: Reported on 12/24/2016) 115 mL 0   No facility-administered medications prior to visit.     ROS See HPI  Objective:  BP 116/76   Pulse (!) 105   Temp 97.8 F (36.6 C)   Ht 5\' 4"  (1.626 m)   Wt 285 lb (129.3 kg)   SpO2 96%   BMI 48.92 kg/m   BP Readings from Last 3 Encounters:  12/24/16 116/76  10/06/16 110/70  08/31/16 112/80    Wt Readings from Last 3 Encounters:  12/24/16 285 lb (129.3 kg)  10/06/16 294 lb 12.8 oz (133.7 kg)  08/31/16 296 lb (134.3 kg)    Physical Exam  Constitutional: She is oriented to person, place, and time.  Cardiovascular: Normal rate.  Pulmonary/Chest: Effort normal.  Abdominal: Soft. She exhibits no distension. There is tenderness. There is no rebound and  no guarding.  Musculoskeletal: Normal range of motion.  Neurological: She is alert and oriented to person, place, and time.  Skin: Skin is warm and dry.  Vitals reviewed.   Lab Results  Component Value Date   WBC 9.2 05/05/2016   HGB 13.8 05/05/2016   HCT 41.5 05/05/2016   PLT 346.0 05/05/2016   GLUCOSE 102 (H) 05/05/2016   CHOL 121 05/05/2016   TRIG 131.0 05/05/2016   HDL 50.30 05/05/2016   LDLCALC 45 05/05/2016   ALT 17 05/05/2016   AST 15 05/05/2016   NA 138 05/05/2016   K 4.4 05/05/2016   CL 104 05/05/2016   CREATININE 0.78 05/05/2016   BUN 11 05/05/2016   CO2 25 05/05/2016   TSH 1.25 05/05/2016   INR 1.03  02/14/2013   HGBA1C 5.7 05/05/2016    Assessment & Plan:   [name] was seen today for pyelonephritis.  Diagnoses and all orders for this visit:  Lower abdominal pain -     Cancel: Urinalysis with Culture, if indicated -     Cancel: Urinalysis with Culture, if indicated -     Urinalysis w microscopic + reflex cultur -     ciprofloxacin (CIPRO) 500 MG tablet; Take 1 tablet (500 mg total) by mouth 2 (two) times daily.  Acute right-sided low back pain without sciatica -     Cancel: Urinalysis with Culture, if indicated -     Cancel: Urinalysis with Culture, if indicated -     Urinalysis w microscopic + reflex cultur -     ciprofloxacin (CIPRO) 500 MG tablet; Take 1 tablet (500 mg total) by mouth 2 (two) times daily.  Nausea -     ondansetron (ZOFRAN) 4 MG tablet; Take 1 tablet (4 mg total) by mouth every 8 (eight) hours as needed for nausea or vomiting. -     ciprofloxacin (CIPRO) 500 MG tablet; Take 1 tablet (500 mg total) by mouth 2 (two) times daily.  Other orders -     TIQ-MISC   I am having [name] start on ondansetron and ciprofloxacin. I am also having her maintain her multivitamin, butalbital-acetaminophen-caffeine, acetaminophen, spironolactone, SPRINTEC 28, levocetirizine, dicyclomine, SUMAtriptan, Melatonin, fluticasone, amitriptyline, venlafaxine XR, cimetidine, chlorpheniramine-HYDROcodone, and BELSOMRA.  Meds ordered this encounter  Medications  . ondansetron (ZOFRAN) 4 MG tablet    Sig: Take 1 tablet (4 mg total) by mouth every 8 (eight) hours as needed for nausea or vomiting.    Dispense:  20 tablet    Refill:  0    Order Specific Question:   Supervising Provider    Answer:   Dianne DunARON, TALIA M [[id number]]  . ciprofloxacin (CIPRO) 500 MG tablet    Sig: Take 1 tablet (500 mg total) by mouth 2 (two) times daily.    Dispense:  6 tablet    Refill:  0    Order Specific Question:   Supervising Provider    Answer:   Dianne DunARON, TALIA M [[id number]]    Follow-up: Return if  symptoms worsen or fail to improve.  Alysia Pennaharlotte Nche, NP

## 2016-12-24 NOTE — Patient Instructions (Addendum)
Due to urine collection error by lab, I do not have urinalysis results and patient is unable to return to office till next week. I sent cipro x 3days and zofran prn for nausea. She was instructed to return to lab for recollection of urine sample if symptoms persist after completion of cipro.  Encourage adequate oral hydration.

## 2016-12-25 ENCOUNTER — Encounter: Payer: Self-pay | Admitting: Nurse Practitioner

## 2016-12-25 LAB — TIQ-MISC

## 2016-12-25 MED ORDER — CIPROFLOXACIN HCL 500 MG PO TABS: 500.0000 mg | ORAL_TABLET | Freq: Two times a day (BID) | ORAL | 0 refills | Status: DC

## 2016-12-26 LAB — TEST AUTHORIZATION

## 2016-12-26 LAB — URINE CULTURE
MICRO NUMBER:: 81347697
SPECIMEN QUALITY:: ADEQUATE

## 2016-12-27 ENCOUNTER — Other Ambulatory Visit: Payer: Self-pay | Admitting: Physician Assistant

## 2016-12-29 ENCOUNTER — Encounter: Payer: Self-pay | Admitting: Physician Assistant

## 2016-12-30 MED ORDER — SUVOREXANT 10 MG PO TABS: 10.0000 mg | ORAL_TABLET | Freq: Every day | ORAL | 1 refills | Status: DC

## 2017-02-23 ENCOUNTER — Other Ambulatory Visit: Payer: Self-pay | Admitting: Emergency Medicine

## 2017-02-23 MED ORDER — VENLAFAXINE HCL ER 150 MG PO CP24: 150.0000 mg | ORAL_CAPSULE | Freq: Every day | ORAL | 1 refills | Status: DC

## 2017-03-10 ENCOUNTER — Other Ambulatory Visit: Payer: Self-pay | Admitting: Physician Assistant

## 2017-03-17 ENCOUNTER — Encounter: Payer: Self-pay | Admitting: Emergency Medicine

## 2017-04-23 ENCOUNTER — Other Ambulatory Visit: Payer: Self-pay | Admitting: Physician Assistant

## 2017-04-23 NOTE — Telephone Encounter (Signed)
Last OV 08/31/2016, no future appointment scheduled  Last filled 03/10/2017, 30 tablets with 0 refills

## 2017-05-10 ENCOUNTER — Ambulatory Visit: Payer: BLUE CROSS/BLUE SHIELD | Admitting: Physician Assistant

## 2017-05-10 DIAGNOSIS — Z2089 Contact with and (suspected) exposure to other communicable diseases: Secondary | ICD-10-CM

## 2017-05-13 DIAGNOSIS — N390 Urinary tract infection, site not specified: Secondary | ICD-10-CM | POA: Diagnosis not present

## 2017-05-26 ENCOUNTER — Other Ambulatory Visit: Payer: Self-pay | Admitting: Physician Assistant

## 2017-05-26 NOTE — Telephone Encounter (Signed)
Request for refill on Belsomra Last filled on 04/23/17 #30 Last OV: 08/31/16 for Dysuria  Please advise

## 2017-05-31 ENCOUNTER — Other Ambulatory Visit: Payer: Self-pay | Admitting: Physician Assistant

## 2017-06-02 ENCOUNTER — Other Ambulatory Visit: Payer: Self-pay

## 2017-06-02 ENCOUNTER — Ambulatory Visit (INDEPENDENT_AMBULATORY_CARE_PROVIDER_SITE_OTHER): Payer: BLUE CROSS/BLUE SHIELD | Admitting: Physician Assistant

## 2017-06-02 ENCOUNTER — Encounter: Payer: Self-pay | Admitting: Physician Assistant

## 2017-06-02 VITALS — BP 118/78 | HR 113 | Temp 98.3°F | Resp 16 | Ht 64.0 in | Wt 283.0 lb

## 2017-06-02 DIAGNOSIS — H6692 Otitis media, unspecified, left ear: Secondary | ICD-10-CM

## 2017-06-02 DIAGNOSIS — B9689 Other specified bacterial agents as the cause of diseases classified elsewhere: Secondary | ICD-10-CM

## 2017-06-02 DIAGNOSIS — H6501 Acute serous otitis media, right ear: Secondary | ICD-10-CM

## 2017-06-02 DIAGNOSIS — J208 Acute bronchitis due to other specified organisms: Secondary | ICD-10-CM

## 2017-06-02 MED ORDER — FLUCONAZOLE 150 MG PO TABS: 150.0000 mg | ORAL_TABLET | Freq: Once | ORAL | 0 refills | Status: AC

## 2017-06-02 MED ORDER — AMOXICILLIN-POT CLAVULANATE 875-125 MG PO TABS: 1.0000 | ORAL_TABLET | Freq: Two times a day (BID) | ORAL | 0 refills | Status: DC

## 2017-06-02 MED ORDER — PROMETHAZINE-DM 6.25-15 MG/5ML PO SYRP: 5.0000 mL | ORAL_SOLUTION | Freq: Four times a day (QID) | ORAL | 0 refills | Status: DC | PRN

## 2017-06-02 NOTE — Patient Instructions (Addendum)
Schedule a complete physical at your earliest convenience as this is overdue.    Take antibiotic (Augmentin) as directed.  Increase fluids.  Get plenty of rest. Use Mucinex for congestion. Use the promethazine-dm for cough. Resume Flonase. Take a daily probiotic (I recommend Align or Culturelle, but even Activia Yogurt may be beneficial).  A humidifier placed in the bedroom may offer some relief for a dry, scratchy throat of nasal irritation.  Read information below on acute bronchitis. Please call or return to clinic if symptoms are not improving.  Acute Bronchitis Bronchitis is when the airways that extend from the windpipe into the lungs get red, puffy, and painful (inflamed). Bronchitis often causes thick spit (mucus) to develop. This leads to a cough. A cough is the most common symptom of bronchitis. In acute bronchitis, the condition usually begins suddenly and goes away over time (usually in 2 weeks). Smoking, allergies, and asthma can make bronchitis worse. Repeated episodes of bronchitis may cause more lung problems.  HOME CARE  Rest.  Drink enough fluids to keep your pee (urine) clear or pale yellow (unless you need to limit fluids as told by your doctor).  Only take over-the-counter or prescription medicines as told by your doctor.  Avoid smoking and secondhand smoke. These can make bronchitis worse. If you are a smoker, think about using nicotine gum or skin patches. Quitting smoking will help your lungs heal faster.  Reduce the chance of getting bronchitis again by:  Washing your hands often.  Avoiding people with cold symptoms.  Trying not to touch your hands to your mouth, nose, or eyes.  Follow up with your doctor as told.  GET HELP IF: Your symptoms do not improve after 1 week of treatment. Symptoms include:  Cough.  Fever.  Coughing up thick spit.  Body aches.  Chest congestion.  Chills.  Shortness of breath.  Sore throat.  GET HELP RIGHT AWAY IF:    You have an increased fever.  You have chills.  You have severe shortness of breath.  You have bloody thick spit (sputum).  You throw up (vomit) often.  You lose too much body fluid (dehydration).  You have a severe headache.  You faint.  MAKE SURE YOU:   Understand these instructions.  Will watch your condition.  Will get help right away if you are not doing well or get worse. Document Released: 07/01/2007 Document Revised: 09/14/2012 Document Reviewed: 07/05/2012 [facility] Patient Information 2015 [place], [place]. This information is not intended to replace advice given to you by your health care provider. Make sure you discuss any questions you have with your health care provider.

## 2017-06-02 NOTE — Progress Notes (Signed)
Patient presents to clinic today c/o 6 days of rapidly worsening chest congestion, nasal congestion, L ear pain and now with cough that has become productive of thick yellow sputum. Denies fever but notes chills and aches. Notes her ears feel very clogged and hearing is muffled.    Past Medical History:  Diagnosis Date  . Allergy   . Anxiety   . Depression   . GERD (gastroesophageal reflux disease)   . IBS (irritable bowel syndrome)   . Migraine   . Overactive bladder   . PCOS (polycystic ovarian syndrome)   . Pelvic pain   . Pseudotumor cerebri   . SUI (stress urinary incontinence, female)   . Urinary incontinence     Current Outpatient Medications on File Prior to Visit  Medication Sig Dispense Refill  . acetaminophen (TYLENOL) 500 MG tablet Take 500 mg by mouth every 6 (six) hours as needed.    Marland Kitchen amitriptyline (ELAVIL) 25 MG tablet TAKE 2 TABLETS BY MOUTH AT BEDTIME 60 tablet 5  . BELSOMRA 10 MG TABS TAKE 1 TABLET BY MOUTH AT BEDTIME 30 tablet 0  . butalbital-acetaminophen-caffeine (FIORICET, ESGIC) 50-325-40 MG per tablet Take 1 tablet by mouth 2 (two) times daily as needed for headache.    . cimetidine (TAGAMET) 200 MG tablet Take 200 mg by mouth 2 (two) times daily.    Marland Kitchen dicyclomine (BENTYL) 10 MG capsule Take 10 mg by mouth as needed for spasms.    . fluticasone (FLONASE) 50 MCG/ACT nasal spray Place 2 sprays into both nostrils daily.    Marland Kitchen levocetirizine (XYZAL) 5 MG tablet Take 5 mg by mouth every evening.  5  . Melatonin 10 MG TABS Take 1 tablet by mouth at bedtime.    . Multiple Vitamin (MULTIVITAMIN) tablet Take 1 tablet by mouth daily.    . ondansetron (ZOFRAN) 4 MG tablet Take 1 tablet (4 mg total) by mouth every 8 (eight) hours as needed for nausea or vomiting. 20 tablet 0  . Probiotic Product (PROBIOTIC-10 PO) Take 1 tablet by mouth daily.    Marland Kitchen spironolactone (ALDACTONE) 100 MG tablet Take 100 mg by mouth daily.  4  . SPRINTEC 28 0.25-35 MG-MCG tablet Take 1 tablet  by mouth daily.  4  . SUMAtriptan (IMITREX) 50 MG tablet Take 50 mg by mouth every 2 (two) hours as needed for migraine. May repeat in 2 hours if headache persists or recurs.    . venlafaxine XR (EFFEXOR-XR) 150 MG 24 hr capsule Take 1 capsule (150 mg total) by mouth daily with breakfast. 90 capsule 1   No current facility-administered medications on file prior to visit.     No Known Allergies  Family History  Problem Relation Age of Onset  . Hypertension Mother   . Depression Mother   . Hypertension Father   . Cancer Father        kidney  . Depression Father   . Cancer Maternal Uncle        stomach  . Hypertension Maternal Grandmother   . Cancer Maternal Grandmother        kidney, bone  . Hypertension Maternal Grandfather   . Cancer Maternal Grandfather        lung, bone  . Stroke Maternal Grandfather   . Hypertension Paternal Grandmother   . Hypertension Paternal Grandfather   . Stroke Paternal Grandfather     Social History   Socioeconomic History  . Marital status: Married    Spouse name: Not on file  .  Number of children: Not on file  . Years of education: Not on file  . Highest education level: Not on file  Occupational History  . Occupation: Nurse    Employer: [employer]  Social Needs  . Financial resource strain: Not on file  . Food insecurity:    Worry: Not on file    Inability: Not on file  . Transportation needs:    Medical: Not on file    Non-medical: Not on file  Tobacco Use  . Smoking status: Never Smoker  . Smokeless tobacco: Never Used  Substance and Sexual Activity  . Alcohol use: No  . Drug use: No  . Sexual activity: Yes    Birth control/protection: Surgical  Lifestyle  . Physical activity:    Days per week: Not on file    Minutes per session: Not on file  . Stress: Not on file  Relationships  . Social connections:    Talks on phone: Not on file    Gets together: Not on file    Attends religious service: Not on file    Active  member of club or organization: Not on file    Attends meetings of clubs or organizations: Not on file    Relationship status: Not on file  Other Topics Concern  . Not on file  Social History Narrative  . Not on file   Review of Systems - See HPI.  All other ROS are negative.  BP 118/78   Pulse (!) 113   Temp 98.3 F (36.8 C) (Oral)   Resp 16   Ht  (1.626 m)   Wt 283 lb (128.4 kg)   SpO2 97%   BMI 48.58 kg/m   Physical Exam  Constitutional: She appears well-developed.  HENT:  Head: Normocephalic and atraumatic.  Right Ear: Tympanic membrane is retracted. A middle ear effusion (serous) is present.  Left Ear: Tympanic membrane is erythematous and bulging. A middle ear effusion (purulent ) is present.  Nose: Rhinorrhea present. Right sinus exhibits no maxillary sinus tenderness and no frontal sinus tenderness. Left sinus exhibits no maxillary sinus tenderness and no frontal sinus tenderness.  Mouth/Throat: Uvula is midline, oropharynx is clear and moist and mucous membranes are normal.  Eyes: Conjunctivae are normal.  Cardiovascular: Normal rate, regular rhythm, normal heart sounds and intact distal pulses.  Pulmonary/Chest: Effort normal and breath sounds normal. No stridor. No respiratory distress. She has no wheezes. She has no rales. She exhibits no tenderness.  Skin: Skin is warm and dry.  Vitals reviewed.  Assessment/Plan: 1. Acute bacterial bronchitis Rx Augmentin.  Increase fluids.  Rest.  Saline nasal spray.  Probiotic.  Mucinex as directed.  Humidifier in bedroom. Rx Promethazine-DM.  Call or return to clinic if symptoms are not improving.  - amoxicillin-clavulanate (AUGMENTIN) 875-125 MG tablet; Take 1 tablet by mouth 2 (two) times daily.  Dispense: 14 tablet; Refill: 0 - promethazine-dextromethorphan (PROMETHAZINE-DM) 6.25-15 MG/5ML syrup; Take 5 mLs by mouth 4 (four) times daily as needed.  Dispense: 118 mL; Refill: 0 - fluconazole (DIFLUCAN) 150 MG tablet;  Take 1 tablet (150 mg total) by mouth once for 1 dose. Repeat in 3 days if needed.  Dispense: 2 tablet; Refill: 0  2. Left acute otitis media Start Augmentin. Supportive measures discussed. - amoxicillin-clavulanate (AUGMENTIN) 875-125 MG tablet; Take 1 tablet by mouth 2 (two) times daily.  Dispense: 14 tablet; Refill: 0  3. Non-recurrent acute serous otitis media of right ear Resume Flonase to help with this.  Symptoms should improve as infections improve.    Piedad Climes, PA-C

## 2017-06-29 ENCOUNTER — Encounter: Payer: Self-pay | Admitting: Physician Assistant

## 2017-07-02 DIAGNOSIS — Z01419 Encounter for gynecological examination (general) (routine) without abnormal findings: Secondary | ICD-10-CM | POA: Diagnosis not present

## 2017-07-02 DIAGNOSIS — Z1151 Encounter for screening for human papillomavirus (HPV): Secondary | ICD-10-CM | POA: Diagnosis not present

## 2017-07-02 DIAGNOSIS — Z6841 Body Mass Index (BMI) 40.0 and over, adult: Secondary | ICD-10-CM | POA: Diagnosis not present

## 2017-07-05 DIAGNOSIS — Z13220 Encounter for screening for lipoid disorders: Secondary | ICD-10-CM | POA: Diagnosis not present

## 2017-07-05 DIAGNOSIS — Z130 Encounter for screening for diseases of the blood and blood-forming organs and certain disorders involving the immune mechanism: Secondary | ICD-10-CM | POA: Diagnosis not present

## 2017-07-05 DIAGNOSIS — Z131 Encounter for screening for diabetes mellitus: Secondary | ICD-10-CM | POA: Diagnosis not present

## 2017-07-05 DIAGNOSIS — Z0000 Encounter for general adult medical examination without abnormal findings: Secondary | ICD-10-CM | POA: Diagnosis not present

## 2017-07-05 DIAGNOSIS — Z1329 Encounter for screening for other suspected endocrine disorder: Secondary | ICD-10-CM | POA: Diagnosis not present

## 2017-07-06 LAB — CBC AND DIFFERENTIAL
HCT: 40 (ref 36–46)
Hemoglobin: 13.5 (ref 12.0–16.0)
Platelets: 35 — AB (ref 150–399)
WBC: 8.5

## 2017-07-06 LAB — HEMOGLOBIN A1C: Hemoglobin A1C: 5.5

## 2017-07-06 LAB — BASIC METABOLIC PANEL
BUN: 8 (ref 4–21)
Creatinine: 0.8 (ref 0.5–1.1)
Glucose: 107
Potassium: 4.1 (ref 3.4–5.3)
Sodium: 138 (ref 137–147)

## 2017-07-06 LAB — HEPATIC FUNCTION PANEL
ALT: 18 (ref 7–35)
AST: 12 — AB (ref 13–35)
Alkaline Phosphatase: 54 (ref 25–125)
Bilirubin, Total: 0.2

## 2017-07-06 LAB — LIPID PANEL
Cholesterol: 111 (ref 0–200)
HDL: 50 (ref 35–70)
LDL Cholesterol: 35
Triglycerides: 129 (ref 40–160)

## 2017-07-06 LAB — TSH: TSH: 4.08 (ref 0.41–5.90)

## 2017-07-19 ENCOUNTER — Other Ambulatory Visit: Payer: Self-pay | Admitting: Physician Assistant

## 2017-07-19 ENCOUNTER — Other Ambulatory Visit: Payer: Self-pay

## 2017-07-19 ENCOUNTER — Ambulatory Visit (INDEPENDENT_AMBULATORY_CARE_PROVIDER_SITE_OTHER): Payer: BLUE CROSS/BLUE SHIELD | Admitting: Physician Assistant

## 2017-07-19 ENCOUNTER — Encounter: Payer: Self-pay | Admitting: Physician Assistant

## 2017-07-19 VITALS — BP 110/80 | HR 92 | Temp 98.2°F | Resp 16 | Ht 65.75 in | Wt 277.0 lb

## 2017-07-19 DIAGNOSIS — R5383 Other fatigue: Secondary | ICD-10-CM

## 2017-07-19 DIAGNOSIS — Z0000 Encounter for general adult medical examination without abnormal findings: Secondary | ICD-10-CM | POA: Diagnosis not present

## 2017-07-19 DIAGNOSIS — R7989 Other specified abnormal findings of blood chemistry: Secondary | ICD-10-CM

## 2017-07-19 LAB — VITAMIN D 25 HYDROXY (VIT D DEFICIENCY, FRACTURES): VITD: 51.9 ng/mL (ref 30.00–100.00)

## 2017-07-19 LAB — TSH: TSH: 3.24 u[IU]/mL (ref 0.35–4.50)

## 2017-07-19 LAB — T4, FREE: Free T4: 0.77 ng/dL (ref 0.60–1.60)

## 2017-07-19 LAB — T3, FREE: T3, Free: 3.3 pg/mL (ref 2.3–4.2)

## 2017-07-19 NOTE — Assessment & Plan Note (Signed)
Depression screen negative. Health Maintenance reviewed. Up-to-date. Followed by GYN (Dr. Cherly Hensenousins). Preventive schedule discussed and handout given in AVS. Will obtain fasting labs today.

## 2017-07-19 NOTE — Assessment & Plan Note (Signed)
Repeat TSH with Free T4/T3 today to further assess.

## 2017-07-19 NOTE — Assessment & Plan Note (Signed)
Recent elevation in TSH. Will check thyroid panel and Vitamin D level. Body mass index is 45.05 kg/m. Deconditioning also contributing. Exercise recommendations reviewed.

## 2017-07-19 NOTE — Patient Instructions (Signed)
-Please go to the lab for blood work.  -Our office will call you with your results unless you have chosen to receive results via MyChart. -If your blood work is normal we will follow-up each year for physicals and as scheduled for chronic medical problems. -If anything is abnormal we will treat accordingly and get you in for a follow-up.    Preventive Care 34-39 Years, Female Preventive care refers to lifestyle choices and visits with your health care provider that can promote health and wellness. What does preventive care include?  A yearly physical exam. This is also called an annual well check.  Dental exams once or twice a year.  Routine eye exams. Ask your health care provider how often you should have your eyes checked.  Personal lifestyle choices, including: ? Daily care of your teeth and gums. ? Regular physical activity. ? Eating a healthy diet. ? Avoiding tobacco and drug use. ? Limiting alcohol use. ? Practicing safe sex. ? Taking vitamin and mineral supplements as recommended by your health care provider. What happens during an annual well check? The services and screenings done by your health care provider during your annual well check will depend on your age, overall health, lifestyle risk factors, and family history of disease. Counseling Your health care provider may ask you questions about your:  Alcohol use.  Tobacco use.  Drug use.  Emotional well-being.  Home and relationship well-being.  Sexual activity.  Eating habits.  Work and work environment.  Method of birth control.  Menstrual cycle.  Pregnancy history.  Screening You may have the following tests or measurements:  Height, weight, and BMI.  Diabetes screening. This is done by checking your blood sugar (glucose) after you have not eaten for a while (fasting).  Blood pressure.  Lipid and cholesterol levels. These may be checked every 5 years starting at age 20.  Skin  check.  Hepatitis C blood test.  Hepatitis B blood test.  Sexually transmitted disease (STD) testing.  BRCA-related cancer screening. This may be done if you have a family history of breast, ovarian, tubal, or peritoneal cancers.  Pelvic exam and Pap test. This may be done every 3 years starting at age 21. Starting at age 30, this may be done every 5 years if you have a Pap test in combination with an HPV test.  Discuss your test results, treatment options, and if necessary, the need for more tests with your health care provider. Vaccines Your health care provider may recommend certain vaccines, such as:  Influenza vaccine. This is recommended every year.  Tetanus, diphtheria, and acellular pertussis (Tdap, Td) vaccine. You may need a Td booster every 10 years.  Varicella vaccine. You may need this if you have not been vaccinated.  HPV vaccine. If you are 26 or younger, you may need three doses over 6 months.  Measles, mumps, and rubella (MMR) vaccine. You may need at least one dose of MMR. You may also need a second dose.  Pneumococcal 13-valent conjugate (PCV13) vaccine. You may need this if you have certain conditions and were not previously vaccinated.  Pneumococcal polysaccharide (PPSV23) vaccine. You may need one or two doses if you smoke cigarettes or if you have certain conditions.  Meningococcal vaccine. One dose is recommended if you are age 19-21 years and a first-year college student living in a residence hall, or if you have one of several medical conditions. You may also need additional booster doses.  Hepatitis A vaccine. You may   need this if you have certain conditions or if you travel or work in places where you may be exposed to hepatitis A.  Hepatitis B vaccine. You may need this if you have certain conditions or if you travel or work in places where you may be exposed to hepatitis B.  Haemophilus influenzae type b (Hib) vaccine. You may need this if you have  certain risk factors.  Talk to your health care provider about which screenings and vaccines you need and how often you need them. This information is not intended to replace advice given to you by your health care provider. Make sure you discuss any questions you have with your health care provider. Document Released: 03/10/2001 Document Revised: 10/02/2015 Document Reviewed: 11/13/2014 Elsevier Interactive Patient Education  2018 Elsevier Inc.  

## 2017-07-19 NOTE — Progress Notes (Signed)
Patient presents to clinic today for annual exam.  Patient had labs recently with work. Has brought a copy for review.  Acute Concerns: Patient has elevation in TSH on work labs at 4.08. Notes issue with fatigue, intermittent constipation, difficulty with weight loss, history of depression. Denies history of anemia or vitmain deficiency. Denies personal history of thyroid issue that she is aware of. Body mass index is 45.05 kg/m. Is a snorer but denies known apneic episodes.   Health Maintenance: Immunizations -- up-to-date.  PAP -- June 7th 2019. Dr. Cherly Hensen. Normal.   Past Medical History:  Diagnosis Date  . Allergy   . Anxiety   . Depression   . GERD (gastroesophageal reflux disease)   . IBS (irritable bowel syndrome)   . Migraine   . Overactive bladder   . PCOS (polycystic ovarian syndrome)   . Pelvic pain   . Pseudotumor cerebri   . SUI (stress urinary incontinence, female)   . Urinary incontinence     Past Surgical History:  Procedure Laterality Date  . CESAREAN SECTION  2009  &  2011   2011 W/ BILATERAL TUBAL LIGATION  . CHOLECYSTECTOMY  2010  . CYSTO WITH HYDRODISTENSION  03/01/2012   Procedure: CYSTOSCOPY/HYDRODISTENSION;  Surgeon: Martina Sinner, MD;  Location: [facility];  Service: Urology;  Laterality: N/A;  INSTILLATION of marcaine and pyridium.  . PUBOVAGINAL SLING N/A 02/14/2013   Procedure: PUBO-VAGINAL SLING AND HYDRODISTENSION;  Surgeon: Martina Sinner, MD;  Location: [facility];  Service: Urology;  Laterality: N/A;  . TRANSTHORACIC ECHOCARDIOGRAM  06/2009  . TUBAL LIGATION      Current Outpatient Medications on File Prior to Visit  Medication Sig Dispense Refill  . amitriptyline (ELAVIL) 25 MG tablet TAKE 2 TABLETS BY MOUTH AT BEDTIME (Patient taking differently: Take 1 tablet at bedtime) 60 tablet 5  . butalbital-acetaminophen-caffeine (FIORICET, ESGIC) 50-325-40 MG per tablet Take 1 tablet by mouth 2 (two) times  daily as needed for headache.    . cimetidine (TAGAMET) 200 MG tablet Take 200 mg by mouth 2 (two) times daily.    Marland Kitchen dicyclomine (BENTYL) 10 MG capsule Take 10 mg by mouth as needed for spasms.    . fluticasone (FLONASE) 50 MCG/ACT nasal spray Place 2 sprays into both nostrils daily.    Marland Kitchen levocetirizine (XYZAL) 5 MG tablet Take 5 mg by mouth every evening.  5  . Melatonin 10 MG TABS Take 1 tablet by mouth at bedtime.    . Multiple Vitamin (MULTIVITAMIN) tablet Take 1 tablet by mouth daily.    . ondansetron (ZOFRAN) 4 MG tablet Take 1 tablet (4 mg total) by mouth every 8 (eight) hours as needed for nausea or vomiting. 20 tablet 0  . Probiotic Product (PROBIOTIC-10 PO) Take 1 tablet by mouth 2 (two) times daily.     Marland Kitchen spironolactone (ALDACTONE) 100 MG tablet Take 100 mg by mouth daily.  4  . SPRINTEC 28 0.25-35 MG-MCG tablet Take 1 tablet by mouth daily.  4  . venlafaxine XR (EFFEXOR-XR) 150 MG 24 hr capsule Take 1 capsule (150 mg total) by mouth daily with breakfast. 90 capsule 1  . vitamin B-12 (CYANOCOBALAMIN) 1000 MCG tablet Take 1,000 mcg by mouth daily.    . BELSOMRA 10 MG TABS TAKE 1 TABLET BY MOUTH AT BEDTIME (Patient not taking: Reported on 07/19/2017) 30 tablet 0  . SUMAtriptan (IMITREX) 50 MG tablet Take 50 mg by mouth every 2 (two) hours as needed  for migraine. May repeat in 2 hours if headache persists or recurs.     No current facility-administered medications on file prior to visit.     No Known Allergies  Family History  Problem Relation Age of Onset  . Hypertension Mother   . Depression Mother   . Hypertension Father   . Cancer Father        kidney  . Depression Father   . Cancer Maternal Uncle        stomach  . Hypertension Maternal Grandmother   . Cancer Maternal Grandmother        kidney, bone  . Hypertension Maternal Grandfather   . Cancer Maternal Grandfather        lung, bone  . Stroke Maternal Grandfather   . Hypertension Paternal Grandmother   .  Hypertension Paternal Grandfather   . Stroke Paternal Grandfather     Social History   Socioeconomic History  . Marital status: Married    Spouse name: Not on file  . Number of children: Not on file  . Years of education: Not on file  . Highest education level: Not on file  Occupational History  . Occupation: Nurse    Employer: [employer]  Social Needs  . Financial resource strain: Not on file  . Food insecurity:    Worry: Not on file    Inability: Not on file  . Transportation needs:    Medical: Not on file    Non-medical: Not on file  Tobacco Use  . Smoking status: Never Smoker  . Smokeless tobacco: Never Used  Substance and Sexual Activity  . Alcohol use: No  . Drug use: No  . Sexual activity: Yes    Birth control/protection: Surgical  Lifestyle  . Physical activity:    Days per week: Not on file    Minutes per session: Not on file  . Stress: Not on file  Relationships  . Social connections:    Talks on phone: Not on file    Gets together: Not on file    Attends religious service: Not on file    Active member of club or organization: Not on file    Attends meetings of clubs or organizations: Not on file    Relationship status: Not on file  . Intimate partner violence:    Fear of current or ex partner: Not on file    Emotionally abused: Not on file    Physically abused: Not on file    Forced sexual activity: Not on file  Other Topics Concern  . Not on file  Social History Narrative  . Not on file   Review of Systems  Constitutional: Positive for malaise/fatigue. Negative for fever and weight loss.  HENT: Negative for ear discharge, ear pain, hearing loss and tinnitus.   Eyes: Negative for blurred vision, double vision, photophobia and pain.  Respiratory: Negative for cough and shortness of breath.   Cardiovascular: Negative for chest pain and palpitations.  Gastrointestinal: Negative for abdominal pain, blood in stool, constipation, diarrhea,  heartburn, melena, nausea and vomiting.  Genitourinary: Negative for dysuria, flank pain, frequency, hematuria and urgency.  Musculoskeletal: Negative for falls.  Neurological: Negative for dizziness, loss of consciousness and headaches.  Endo/Heme/Allergies: Negative for environmental allergies.  Psychiatric/Behavioral: Negative for depression, hallucinations, substance abuse and suicidal ideas. The patient is not nervous/anxious and does not have insomnia.     BP 110/80   Pulse 92   Temp 98.2 F (36.8 C) (Oral)  Resp 16   Ht 5' 5.75" (1.67 m)   Wt 277 lb (125.6 kg)   SpO2 97%   BMI 45.05 kg/m   Physical Exam  Constitutional: She is oriented to person, place, and time.  HENT:  Head: Normocephalic and atraumatic.  Right Ear: Tympanic membrane, external ear and ear canal normal.  Left Ear: Tympanic membrane, external ear and ear canal normal.  Nose: Nose normal. No mucosal edema.  Mouth/Throat: Uvula is midline, oropharynx is clear and moist and mucous membranes are normal. No oropharyngeal exudate or posterior oropharyngeal erythema.  Eyes: Pupils are equal, round, and reactive to light. Conjunctivae are normal.  Neck: Neck supple. No thyromegaly present.  Cardiovascular: Normal rate, regular rhythm, normal heart sounds and intact distal pulses.  Pulmonary/Chest: Effort normal and breath sounds normal. No respiratory distress. She has no wheezes. She has no rales.  Abdominal: Soft. Bowel sounds are normal. She exhibits no distension and no mass. There is no tenderness. There is no rebound and no guarding.  Lymphadenopathy:    She has no cervical adenopathy.  Neurological: She is alert and oriented to person, place, and time. No cranial nerve deficit.  Skin: Skin is warm and dry. No rash noted.  Vitals reviewed.  Assessment/Plan: Visit for preventive health examination Depression screen negative. Health Maintenance reviewed. Up-to-date. Followed by GYN (Dr.  Cherly Hensen). Preventive schedule discussed and handout given in AVS. Will obtain fasting labs today.   Fatigue Recent elevation in TSH. Will check thyroid panel and Vitamin D level. Body mass index is 45.05 kg/m. Deconditioning also contributing. Exercise recommendations reviewed.   Elevated TSH Repeat TSH with Free T4/T3 today to further assess.     Samantha Climes, PA-C

## 2017-07-20 ENCOUNTER — Encounter: Payer: Self-pay | Admitting: Physician Assistant

## 2017-07-20 DIAGNOSIS — R5383 Other fatigue: Secondary | ICD-10-CM

## 2017-07-20 DIAGNOSIS — E6609 Other obesity due to excess calories: Secondary | ICD-10-CM

## 2017-07-20 DIAGNOSIS — R0683 Snoring: Secondary | ICD-10-CM

## 2017-07-20 NOTE — Addendum Note (Signed)
Addended by: Lachae Hohler C on: 07/20/2017 02:12 PM   Modules accepted: Orders  

## 2017-07-21 LAB — HM PAP SMEAR

## 2017-08-03 ENCOUNTER — Encounter: Payer: Self-pay | Admitting: Emergency Medicine

## 2017-08-10 ENCOUNTER — Encounter: Payer: Self-pay | Admitting: Physician Assistant

## 2017-08-10 ENCOUNTER — Ambulatory Visit (INDEPENDENT_AMBULATORY_CARE_PROVIDER_SITE_OTHER): Payer: BLUE CROSS/BLUE SHIELD | Admitting: Physician Assistant

## 2017-08-10 ENCOUNTER — Other Ambulatory Visit: Payer: Self-pay

## 2017-08-10 VITALS — BP 122/78 | HR 117 | Temp 98.7°F | Resp 17 | Ht 64.5 in | Wt 275.2 lb

## 2017-08-10 DIAGNOSIS — N3000 Acute cystitis without hematuria: Secondary | ICD-10-CM | POA: Diagnosis not present

## 2017-08-10 LAB — POCT URINALYSIS DIPSTICK
Bilirubin, UA: NEGATIVE
Blood, UA: POSITIVE
Glucose, UA: NEGATIVE
Ketones, UA: POSITIVE
Nitrite, UA: POSITIVE
Protein, UA: POSITIVE — AB
Spec Grav, UA: 1.015 (ref 1.010–1.025)
Urobilinogen, UA: 1 E.U./dL
pH, UA: 7 (ref 5.0–8.0)

## 2017-08-10 MED ORDER — CEPHALEXIN 500 MG PO CAPS: 500.0000 mg | ORAL_CAPSULE | Freq: Two times a day (BID) | ORAL | 0 refills | Status: DC

## 2017-08-10 NOTE — Progress Notes (Signed)
YQM:VHQIONPCP:[name], Kristine GarbeWilliam C, PA-Kramer Chief Complaint  Patient presents with  . Urinary Tract Infection    Symptoms started about a week ago, bladder pain and chills, frequency     Current Issues:  Presents with 7 days of dysuria, urinary urgency and urinary frequency Associated symptoms include:  chills, lower abdominal pain and urinary hesitancy. Denies flank pain, vomiting, hematuria or change to bowel habits.  There is a previous history of of similar symptoms. Sexually active:  Yes with female.   No concern for STI.  Prior to Admission medications   Medication Sig Start Date End Date Taking? Authorizing Provider  amitriptyline (ELAVIL) 25 MG tablet TAKE 2 TABLETS BY MOUTH AT BEDTIME Patient taking differently: Take 1 tablet at bedtime 12/28/16  Yes Waldon MerlMartin, Christerpher Clos C, PA-Kramer  BELSOMRA 10 MG TABS TAKE 1 TABLET BY MOUTH EVERYDAY AT BEDTIME 07/19/17  Yes Waldon MerlMartin, Yun Gutierrez C, PA-Kramer  butalbital-acetaminophen-caffeine (FIORICET, ESGIC) 50-325-40 MG per tablet Take 1 tablet by mouth 2 (two) times daily as needed for headache.   Yes [provider]  cimetidine (TAGAMET) 200 MG tablet Take 200 mg by mouth 2 (two) times daily.   Yes [provider]  dicyclomine (BENTYL) 10 MG capsule Take 10 mg by mouth as needed for spasms.   Yes [provider]  fluticasone (FLONASE) 50 MCG/ACT nasal spray Place 2 sprays into both nostrils as needed.    Yes [provider]  levocetirizine (XYZAL) 5 MG tablet Take 5 mg by mouth every evening. 12/25/15  Yes [provider]  Melatonin 10 MG TABS Take 1 tablet by mouth at bedtime.   Yes [provider]  Multiple Vitamin (MULTIVITAMIN) tablet Take 1 tablet by mouth daily.   Yes [provider]  ondansetron (ZOFRAN) 4 MG tablet Take 1 tablet (4 mg total) by mouth every 8 (eight) hours as needed for nausea or vomiting. 12/24/16  Yes Nche, Bonna Gainsharlotte Lum, NP  Probiotic Product (PROBIOTIC-10 PO) Take 1 tablet by mouth 2 (two)  times daily.    Yes [provider]  spironolactone (ALDACTONE) 100 MG tablet Take 100 mg by mouth daily. 12/03/15  Yes [provider]  SPRINTEC 28 0.25-35 MG-MCG tablet Take 1 tablet by mouth daily. 12/01/15  Yes [provider]  SUMAtriptan (IMITREX) 50 MG tablet Take 50 mg by mouth every 2 (two) hours as needed for migraine. May repeat in 2 hours if headache persists or recurs.   Yes [provider]  venlafaxine XR (EFFEXOR-XR) 150 MG 24 hr capsule Take 1 capsule (150 mg total) by mouth daily with breakfast. 02/23/17  Yes Waldon MerlMartin, Ali Mohl C, PA-Kramer  vitamin B-12 (CYANOCOBALAMIN) 1000 MCG tablet Take 1,000 mcg by mouth daily.   Yes [provider]    Review of Systems: Pertinent ROS are listed in the HPI.  PE:  BP 122/78   Pulse (!) 117   Temp 98.7 F (37.1 Kramer) (Oral)   Resp 17   Ht 5' 4.5" (1.638 m)   Wt 275 lb 3.2 oz (124.8 kg)   SpO2 96%   BMI 46.51 kg/m    BP 122/78   Pulse (!) 117   Temp 98.7 F (37.1 Kramer) (Oral)   Resp 17   Ht 5' 4.5" (1.638 m)   Wt 275 lb 3.2 oz (124.8 kg)   SpO2 96%   BMI 46.51 kg/m  General appearance: alert, cooperative, appears stated age and no distress Lungs: clear to auscultation bilaterally Heart: regular rate and rhythm, S1, S2 normal,  no murmur, click, rub or gallop Abdomen: soft, non-tender; bowel sounds normal; no masses,  no organomegaly and Negative CVA tenderness  Results for orders placed or performed in visit on 08/03/17  CBC and differential  Result Value Ref Range   Hemoglobin 13.5 12.0 - 16.0   HCT 40 36 - 46   Platelets 35 (A) 150 - 399   WBC 8.5   Basic metabolic panel  Result Value Ref Range   Glucose 107    BUN 8 4 - 21   Creatinine 0.8 0.5 - 1.1   Potassium 4.1 3.4 - 5.3   Sodium 138 137 - 147  Lipid panel  Result Value Ref Range   Triglycerides 129 40 - 160   Cholesterol 111 0 - 200   HDL 50 35 - 70   LDL Cholesterol 35   Hepatic function panel  Result Value Ref Range    Alkaline Phosphatase 54 25 - 125   ALT 18 7 - 35   AST 12 (A) 13 - 35   Bilirubin, Total 0.2   Hemoglobin A1c  Result Value Ref Range   Hemoglobin A1C 5.5   TSH  Result Value Ref Range   TSH 4.08 0.41 - 5.90    Assessment and Plan:   1. Acute cystitis without hematuria Urine dip with + LE and nitrites. Will send for culture. Patient to be started on Keflex 500 mg BID x 7 days. Will alter based on culture results. Hydration very important. Reviewed with patient. Supportive measures and OTC medications reviewed. Alarm signs/symptoms reviewed. Handout given. - POCT urinalysis dipstick - cephALEXin (KEFLEX) 500 MG capsule; Take 1 capsule (500 mg total) by mouth 2 (two) times daily.  Dispense: 14 capsule; Refill: 0 - Urine culture

## 2017-08-10 NOTE — Patient Instructions (Signed)
Your symptoms are consistent with a bladder infection, also called acute cystitis. Please take your antibiotic (Keflex) as directed until all pills are gone.  Stay very well hydrated.  Consider a daily probiotic (Align, Culturelle, or Activia) to help prevent stomach upset caused by the antibiotic.  Taking a probiotic daily may also help prevent recurrent UTIs.  Also consider taking AZO (Phenazopyridine) tablets to help decrease pain with urination.  I will call you with your urine testing results.  We will change antibiotics if indicated.  Call or return to clinic if symptoms are not resolved by completion of antibiotic.   Urinary Tract Infection A urinary tract infection (UTI) can occur any place along the urinary tract. The tract includes the kidneys, ureters, bladder, and urethra. A type of germ called bacteria often causes a UTI. UTIs are often helped with antibiotic medicine.  HOME CARE   If given, take antibiotics as told by your doctor. Finish them even if you start to feel better.  Drink enough fluids to keep your pee (urine) clear or pale yellow.  Avoid tea, drinks with caffeine, and bubbly (carbonated) drinks.  Pee often. Avoid holding your pee in for a long time.  Pee before and after having sex (intercourse).  Wipe from front to back after you poop (bowel movement) if you are a woman. Use each tissue only once. GET HELP RIGHT AWAY IF:   You have back pain.  You have lower belly (abdominal) pain.  You have chills.  You feel sick to your stomach (nauseous).  You throw up (vomit).  Your burning or discomfort with peeing does not go away.  You have a fever.  Your symptoms are not better in 3 days. MAKE SURE YOU:   Understand these instructions.  Will watch your condition.  Will get help right away if you are not doing well or get worse. Document Released: 07/01/2007 Document Revised: 10/07/2011 Document Reviewed: 08/13/2011 ExitCare Patient Information 2015  [employer]. This information is not intended to replace advice given to you by your health care provider. Make sure you discuss any questions you have with your health care provider.   

## 2017-08-12 LAB — URINE CULTURE
MICRO NUMBER:: 90842207
SPECIMEN QUALITY:: ADEQUATE

## 2017-08-31 ENCOUNTER — Ambulatory Visit (INDEPENDENT_AMBULATORY_CARE_PROVIDER_SITE_OTHER): Payer: BLUE CROSS/BLUE SHIELD | Admitting: Pulmonary Disease

## 2017-08-31 ENCOUNTER — Encounter: Payer: Self-pay | Admitting: Pulmonary Disease

## 2017-08-31 VITALS — BP 138/88 | HR 110 | Ht 64.0 in | Wt 277.0 lb

## 2017-08-31 DIAGNOSIS — G4710 Hypersomnia, unspecified: Secondary | ICD-10-CM

## 2017-08-31 DIAGNOSIS — R0683 Snoring: Secondary | ICD-10-CM

## 2017-08-31 NOTE — Patient Instructions (Signed)
With sleep disorder  Insomnia with possible Obstructive sleep apnea    Schedule for home sleep study  Will se you back in 2 months  Continue efforts with weight loss  Call with any concerns

## 2017-08-31 NOTE — Progress Notes (Signed)
[name]    [id number]    04-11-1985  Primary Care Physician:Martin, [name], PA-C  Referring Physician: Waldon Merl, PA-C [address] [place], [place] [address]  Chief complaint:    Insomnia Inadequate sleep Witnessed snoring  HPI: Patient with a history of chronic insomnia, has had witnessed snoring Working on weight loss-successfully The possibility of shiftwork disorder contributing to her insomnia  Occupation: She is a Engineer, civil (consulting)  exposures: No significant exposure Smoking history: Never smoker Travel history: Recent travel Relevant family history: Mom does have OSA  Outpatient Encounter Medications as of 08/31/2017  Medication Sig  . amitriptyline (ELAVIL) 25 MG tablet TAKE 2 TABLETS BY MOUTH AT BEDTIME (Patient taking differently: Take 1 tablet at bedtime)  . BELSOMRA 10 MG TABS TAKE 1 TABLET BY MOUTH EVERYDAY AT BEDTIME  . butalbital-acetaminophen-caffeine (FIORICET, ESGIC) 50-325-40 MG per tablet Take 1 tablet by mouth 2 (two) times daily as needed for headache.  . cimetidine (TAGAMET) 200 MG tablet Take 200 mg by mouth 2 (two) times daily.  Marland Kitchen dicyclomine (BENTYL) 10 MG capsule Take 10 mg by mouth as needed for spasms.  . fluticasone (FLONASE) 50 MCG/ACT nasal spray Place 2 sprays into both nostrils as needed.   Marland Kitchen levocetirizine (XYZAL) 5 MG tablet Take 5 mg by mouth every evening.  . Melatonin 10 MG TABS Take 1 tablet by mouth at bedtime.  . Multiple Vitamin (MULTIVITAMIN) tablet Take 1 tablet by mouth daily.  . ondansetron (ZOFRAN) 4 MG tablet Take 1 tablet (4 mg total) by mouth every 8 (eight) hours as needed for nausea or vomiting.  . Probiotic Product (PROBIOTIC-10 PO) Take 1 tablet by mouth 2 (two) times daily.   Marland Kitchen spironolactone (ALDACTONE) 100 MG tablet Take 100 mg by mouth daily.  . SPRINTEC 28 0.25-35 MG-MCG tablet Take 1 tablet by mouth daily.  . SUMAtriptan (IMITREX) 50 MG tablet Take 50 mg by mouth every 2 (two) hours as needed  for migraine. May repeat in 2 hours if headache persists or recurs.  . venlafaxine XR (EFFEXOR-XR) 150 MG 24 hr capsule Take 1 capsule (150 mg total) by mouth daily with breakfast.  . vitamin B-12 (CYANOCOBALAMIN) 1000 MCG tablet Take 1,000 mcg by mouth daily.  . [DISCONTINUED] cephALEXin (KEFLEX) 500 MG capsule Take 1 capsule (500 mg total) by mouth 2 (two) times daily.   No facility-administered encounter medications on file as of 08/31/2017.     Allergies as of 08/31/2017  . (No Known Allergies)    Past Medical History:  Diagnosis Date  . Allergy   . Anxiety   . Depression   . GERD (gastroesophageal reflux disease)   . IBS (irritable bowel syndrome)   . Migraine   . Overactive bladder   . PCOS (polycystic ovarian syndrome)   . Pelvic pain   . Pseudotumor cerebri   . SUI (stress urinary incontinence, female)   . Urinary incontinence     Past Surgical History:  Procedure Laterality Date  . CESAREAN SECTION  2009  &  2011   2011 W/ BILATERAL TUBAL LIGATION  . CHOLECYSTECTOMY  2010  . CYSTO WITH HYDRODISTENSION  03/01/2012   Procedure: CYSTOSCOPY/HYDRODISTENSION;  Surgeon: Martina Sinner, MD;  Location: [facility];  Service: Urology;  Laterality: N/A;  INSTILLATION of marcaine and pyridium.  . PUBOVAGINAL SLING N/A 02/14/2013   Procedure: PUBO-VAGINAL SLING AND HYDRODISTENSION;  Surgeon: Martina Sinner, MD;  Location: [facility];  Service: Urology;  Laterality: N/A;  . TRANSTHORACIC ECHOCARDIOGRAM  06/2009  . TUBAL LIGATION      Family History  Problem Relation Age of Onset  . Hypertension Mother   . Depression Mother   . Hypertension Father   . Cancer Father        kidney  . Depression Father   . Cancer Maternal Uncle        stomach  . Hypertension Maternal Grandmother   . Cancer Maternal Grandmother        kidney, bone  . Hypertension Maternal Grandfather   . Cancer Maternal Grandfather        lung, bone  . Stroke Maternal  Grandfather   . Hypertension Paternal Grandmother   . Hypertension Paternal Grandfather   . Stroke Paternal Grandfather     Social History   Socioeconomic History  . Marital status: Married    Spouse name: Not on file  . Number of children: Not on file  . Years of education: Not on file  . Highest education level: Not on file  Occupational History  . Occupation: Nurse    Employer: [employer]  Social Needs  . Financial resource strain: Not on file  . Food insecurity:    Worry: Not on file    Inability: Not on file  . Transportation needs:    Medical: Not on file    Non-medical: Not on file  Tobacco Use  . Smoking status: Never Smoker  . Smokeless tobacco: Never Used  Substance and Sexual Activity  . Alcohol use: No  . Drug use: No  . Sexual activity: Yes    Birth control/protection: Surgical  Lifestyle  . Physical activity:    Days per week: Not on file    Minutes per session: Not on file  . Stress: Not on file  Relationships  . Social connections:    Talks on phone: Not on file    Gets together: Not on file    Attends religious service: Not on file    Active member of club or organization: Not on file    Attends meetings of clubs or organizations: Not on file    Relationship status: Not on file  . Intimate partner violence:    Fear of current or ex partner: Not on file    Emotionally abused: Not on file    Physically abused: Not on file    Forced sexual activity: Not on file  Other Topics Concern  . Not on file  Social History Narrative  . Not on file    Review of systems: Review of Systems  Constitutional: Negative for fever and chills.  HENT: Negative.   Eyes: Negative for blurred vision.  Respiratory: as per HPI, no shortness of breath, Cardiovascular: Negative for chest pain and palpitations.  Gastrointestinal: Negative for vomiting, diarrhea, blood per rectum. Genitourinary: Negative for dysuria, urgency, frequency and hematuria.    Musculoskeletal: Negative for myalgias, back pain and joint pain.  Skin: Negative for itching and rash.  Neurological: Negative for dizziness, tremors, focal weakness, seizures and loss of consciousness.  Endo/Heme/Allergies: Negative for environmental allergies.  Psychiatric/Behavioral: History of anxiety All other systems reviewed and are negative.  Physical Exam: Blood pressure 138/88, pulse (!) 110, height 5\' 4"  (1.626 m), weight 277 lb (125.6 kg), SpO2 94 %. Gen:      No acute distress, obese HEENT:  EOMI, sclera anicteric Neck:     No masses; no thyromegaly Lungs:  Clear to auscultation bilaterally; normal respiratory effort CV:         Regular rate and rhythm; no murmurs Abd:      + bowel sounds; soft, non-tender; no palpable masses, no distension Ext:    No edema; adequate peripheral perfusion Skin:      Warm and dry; no rash Neuro: alert and oriented x 3 Psych: normal mood and affect  Assessment:  .  Insomnia -This has been a chronic complaint  .  Moderate probability of significant sleep disordered breathing -Snoring, fatigue, daytime sleepiness  .  Obesity -She is losing weight, actively working on this  Plan/Recommendations:  We will set patient up with a home sleep study  Significant sleep disordered breathing likely contributing to her insomnia  Treated and sleep disordered breathing will definitely help her with her weight loss and general well-being  Encouraged to call with any significant concerns  Will see her back in the office in 2 months   Virl Diamond MD [facility] 08/31/2017, 11:36 AM  CC: Waldon Merl, PA-C

## 2017-09-15 DIAGNOSIS — R0683 Snoring: Secondary | ICD-10-CM | POA: Diagnosis not present

## 2017-09-16 ENCOUNTER — Other Ambulatory Visit: Payer: Self-pay | Admitting: *Deleted

## 2017-09-16 DIAGNOSIS — R0683 Snoring: Secondary | ICD-10-CM | POA: Diagnosis not present

## 2017-09-16 DIAGNOSIS — G4710 Hypersomnia, unspecified: Secondary | ICD-10-CM

## 2017-09-20 ENCOUNTER — Telehealth: Payer: Self-pay | Admitting: Pulmonary Disease

## 2017-09-20 NOTE — Telephone Encounter (Signed)
Per AO, HST came back negative for significant sleep disordered breathing. If there is significant clinical concern for sleep disordered breathing, he would suggest an in lab polysomnogram as the next step.   Spoke with patient. She is aware of results. Verbalized understanding. Nothing further needed at time of call.

## 2017-09-28 ENCOUNTER — Encounter: Payer: Self-pay | Admitting: Physician Assistant

## 2017-09-29 MED ORDER — SUVOREXANT 20 MG PO TABS: 20.0000 mg | ORAL_TABLET | Freq: Every day | ORAL | 2 refills | Status: DC

## 2017-10-02 ENCOUNTER — Other Ambulatory Visit: Payer: Self-pay | Admitting: Physician Assistant

## 2017-10-19 ENCOUNTER — Ambulatory Visit (INDEPENDENT_AMBULATORY_CARE_PROVIDER_SITE_OTHER): Payer: BLUE CROSS/BLUE SHIELD | Admitting: Family Medicine

## 2017-10-19 ENCOUNTER — Other Ambulatory Visit: Payer: Self-pay

## 2017-10-19 ENCOUNTER — Encounter: Payer: Self-pay | Admitting: Family Medicine

## 2017-10-19 VITALS — BP 124/80 | HR 140 | Temp 98.8°F | Ht 64.0 in | Wt 272.8 lb

## 2017-10-19 DIAGNOSIS — R059 Cough, unspecified: Secondary | ICD-10-CM

## 2017-10-19 DIAGNOSIS — B9689 Other specified bacterial agents as the cause of diseases classified elsewhere: Secondary | ICD-10-CM

## 2017-10-19 DIAGNOSIS — J208 Acute bronchitis due to other specified organisms: Secondary | ICD-10-CM

## 2017-10-19 DIAGNOSIS — R05 Cough: Secondary | ICD-10-CM

## 2017-10-19 LAB — POC INFLUENZA A&B (BINAX/QUICKVUE)
Influenza A, POC: NEGATIVE
Influenza B, POC: NEGATIVE

## 2017-10-19 MED ORDER — GUAIFENESIN-CODEINE 100-10 MG/5ML PO SOLN
5.0000 mL | Freq: Four times a day (QID) | ORAL | 0 refills | Status: DC | PRN
Start: 2017-10-19 — End: 2018-01-20

## 2017-10-19 MED ORDER — BENZONATATE 100 MG PO CAPS: 100.0000 mg | ORAL_CAPSULE | Freq: Two times a day (BID) | ORAL | 0 refills | Status: DC | PRN

## 2017-10-19 MED ORDER — AZITHROMYCIN 250 MG PO TABS: ORAL_TABLET | ORAL | 0 refills | Status: DC

## 2017-10-19 NOTE — Patient Instructions (Signed)
Please follow up if symptoms do not improve or as needed.    If you have any questions or concerns, please don't hesitate to send me a message via MyChart or call the office at [phone]. Thank you for visiting with [name] today! It's our pleasure caring for you.   Acute Bronchitis, Adult Acute bronchitis is sudden (acute) swelling of the air tubes (bronchi) in the lungs. Acute bronchitis causes these tubes to fill with mucus, which can make it hard to breathe. It can also cause coughing or wheezing. In adults, acute bronchitis usually goes away within 2 weeks. A cough caused by bronchitis may last up to 3 weeks. Smoking, allergies, and asthma can make the condition worse. Repeated episodes of bronchitis may cause further lung problems, such as chronic obstructive pulmonary disease (COPD). What are the causes? This condition can be caused by germs and by substances that irritate the lungs, including:  Cold and flu viruses. This condition is most often caused by the same virus that causes a cold.  Bacteria.  Exposure to tobacco smoke, dust, fumes, and air pollution.  What increases the risk? This condition is more likely to develop in people who:  Have close contact with someone with acute bronchitis.  Are exposed to lung irritants, such as tobacco smoke, dust, fumes, and vapors.  Have a weak immune system.  Have a respiratory condition such as asthma.  What are the signs or symptoms? Symptoms of this condition include:  A cough.  Coughing up clear, yellow, or green mucus.  Wheezing.  Chest congestion.  Shortness of breath.  A fever.  Body aches.  Chills.  A sore throat.  How is this diagnosed? This condition is usually diagnosed with a physical exam. During the exam, your health care provider may order tests, such as chest X-rays, to rule out other conditions. He or she may also:  Test a sample of your mucus for bacterial infection.  Check the level of oxygen in  your blood. This is done to check for pneumonia.  Do a chest X-ray or lung function testing to rule out pneumonia and other conditions.  Perform blood tests.  Your health care provider will also ask about your symptoms and medical history. How is this treated? Most cases of acute bronchitis clear up over time without treatment. Your health care provider may recommend:  Drinking more fluids. Drinking more makes your mucus thinner, which may make it easier to breathe.  Taking a medicine for a fever or cough.  Taking an antibiotic medicine.  Using an inhaler to help improve shortness of breath and to control a cough.  Using a cool mist vaporizer or humidifier to make it easier to breathe.  Follow these instructions at home: Medicines  Take over-the-counter and prescription medicines only as told by your health care provider.  If you were prescribed an antibiotic, take it as told by your health care provider. Do not stop taking the antibiotic even if you start to feel better. General instructions  Get plenty of rest.  Drink enough fluids to keep your urine clear or pale yellow.  Avoid smoking and secondhand smoke. Exposure to cigarette smoke or irritating chemicals will make bronchitis worse. If you smoke and you need help quitting, ask your health care provider. Quitting smoking will help your lungs heal faster.  Use an inhaler, cool mist vaporizer, or humidifier as told by your health care provider.  Keep all follow-up visits as told by your health care provider. This  is important. How is this prevented? To lower your risk of getting this condition again:  Wash your hands often with soap and water. If soap and water are not available, use hand sanitizer.  Avoid contact with people who have cold symptoms.  Try not to touch your hands to your mouth, nose, or eyes.  Make sure to get the flu shot every year.  Contact a health care provider if:  Your symptoms do not improve  in 2 weeks of treatment. Get help right away if:  You cough up blood.  You have chest pain.  You have severe shortness of breath.  You become dehydrated.  You faint or keep feeling like you are going to faint.  You keep vomiting.  You have a severe headache.  Your fever or chills gets worse. This information is not intended to replace advice given to you by your health care provider. Make sure you discuss any questions you have with your health care provider. Document Released: 02/20/2004 Document Revised: 08/07/2015 Document Reviewed: 07/03/2015 Elsevier Interactive Patient Education  [employer]2018 [employer] Inc[employer]

## 2017-10-19 NOTE — Progress Notes (Signed)
Subjective  CC:  Chief Complaint  Patient presents with  . Cough    Productive cough with Green Discharge, low grade fever at home, body aches since friday     HPI: SUBJECTIVE:  [name] is a 32 y.o. female who complains of congestion, nasal blockage, post nasal drip, cough described as productive and denies sinus, high fevers, SOB, chest pain or significant GI symptoms. Symptoms have been present for a week. Son had similar illness. She denies a history of anorexia, dizziness, vomiting and wheezing. She denies a history of asthma or COPD. Patient does not smoke cigarettes.  Assessment  1. Acute bacterial bronchitis   2. Cough      Plan  Discussion:  Treat for bacterial bronchitis due to prolonged course and worsening symptoms. Education regarding differences between viral and bacterial infections and treatment options are discussed.  Supportive care measures are recommended.  We discussed the use of mucolytic's, decongestants, antihistamines and antitussives as needed.  Tylenol or Advil are recommended if needed.  Follow up: prn   Orders Placed This Encounter  Procedures  . POC Influenza A&B (Binax test)   Meds ordered this encounter  Medications  . azithromycin (ZITHROMAX) 250 MG tablet    Sig: Take 2 tabs today, then 1 tab daily for 4 days    Dispense:  1 each    Refill:  0  . guaiFENesin-codeine 100-10 MG/5ML syrup    Sig: Take 5 mLs by mouth every 6 (six) hours as needed for cough.    Dispense:  120 mL    Refill:  0  . benzonatate (TESSALON) 100 MG capsule    Sig: Take 1 capsule (100 mg total) by mouth 2 (two) times daily as needed for cough.    Dispense:  20 capsule    Refill:  0      I reviewed the patients updated PMH, FH, and SocHx.  Social History: Patient  reports that she has never smoked. She has never used smokeless tobacco. She reports that she does not drink alcohol or use drugs.  Patient Active Problem List   Diagnosis Date Noted  .  Elevated TSH 07/19/2017  . Fatigue 07/19/2017  . Visit for preventive health examination 05/05/2016  . Anxiety and depression 01/18/2016  . Insomnia 01/18/2016  . Chronic migraine 01/18/2016  . CARDIOMEGALY 06/13/2009    Review of Systems: Cardiovascular: negative for chest pain Respiratory: negative for SOB or hemoptysis Gastrointestinal: negative for abdominal pain Genitourinary: negative for dysuria or gross hematuria Current Meds  Medication Sig  . amitriptyline (ELAVIL) 25 MG tablet TAKE 2 TABLETS BY MOUTH AT BEDTIME  . cimetidine (TAGAMET) 200 MG tablet Take 200 mg by mouth 2 (two) times daily.  . fluticasone (FLONASE) 50 MCG/ACT nasal spray Place 2 sprays into both nostrils as needed.   . Melatonin 10 MG TABS Take 1 tablet by mouth at bedtime.  . Multiple Vitamin (MULTIVITAMIN) tablet Take 1 tablet by mouth daily.  . Probiotic Product (PROBIOTIC-10 PO) Take 1 tablet by mouth 2 (two) times daily.   Marland Kitchen spironolactone (ALDACTONE) 100 MG tablet Take 100 mg by mouth daily.  . SPRINTEC 28 0.25-35 MG-MCG tablet Take 1 tablet by mouth daily.  . SUMAtriptan (IMITREX) 50 MG tablet Take 50 mg by mouth every 2 (two) hours as needed for migraine. May repeat in 2 hours if headache persists or recurs.  . Suvorexant (BELSOMRA) 20 MG TABS Take 20 mg by mouth at bedtime.  Marland Kitchen venlafaxine XR (EFFEXOR-XR)  150 MG 24 hr capsule Take 1 capsule (150 mg total) by mouth daily with breakfast.  . vitamin B-12 (CYANOCOBALAMIN) 1000 MCG tablet Take 1,000 mcg by mouth daily.    Objective  Vitals: BP 124/80   Pulse (!) 140   Temp 98.8 F (37.1 C)   Ht 5\' 4"  (1.626 m)   Wt 272 lb 12.8 oz (123.7 kg)   SpO2 96%   BMI 46.83 kg/m  General: no acute distress but hacking cough Psych:  Alert and oriented, normal mood and affect HEENT:  Normocephalic, atraumatic, supple neck, moist mucous membranes, mildly erythematous pharynx without exudate, mild lymphadenopathy, supple neck Cardiovascular:  RRR without  murmur. no edema Respiratory:  Good breath sounds bilaterally, CTAB with normal respiratory effort with occasional rhonchi Skin:  Warm, no rashes Neurologic:   Mental status is normal. normal gait  Office Visit on 10/19/2017  Component Date Value Ref Range Status  . Influenza A, POC 10/19/2017 Negative  Negative Final  . Influenza B, POC 10/19/2017 Negative  Negative Final     Commons side effects, risks, benefits, and alternatives for medications and treatment plan prescribed today were discussed, and the patient expressed understanding of the given instructions. Patient is instructed to call or message via MyChart if he/she has any questions or concerns regarding our treatment plan. No barriers to understanding were identified. We discussed Red Flag symptoms and signs in detail. Patient expressed understanding regarding what to do in case of urgent or emergency type symptoms.  Medication list was reconciled, printed and provided to the patient in AVS. Patient instructions and summary information was reviewed with the patient as documented in the AVS. This note was prepared with assistance of Dragon voice recognition software. Occasional wrong-word or sound-a-like substitutions may have occurred due to the inherent limitations of voice recognition software

## 2017-10-22 ENCOUNTER — Other Ambulatory Visit: Payer: Self-pay | Admitting: Physician Assistant

## 2017-10-26 ENCOUNTER — Encounter: Payer: Self-pay | Admitting: Physician Assistant

## 2017-10-26 MED ORDER — SUMATRIPTAN SUCCINATE 50 MG PO TABS: 50.0000 mg | ORAL_TABLET | Freq: Once | ORAL | 0 refills | Status: DC

## 2017-11-01 ENCOUNTER — Ambulatory Visit: Payer: BLUE CROSS/BLUE SHIELD | Admitting: Pulmonary Disease

## 2018-01-05 ENCOUNTER — Other Ambulatory Visit: Payer: Self-pay | Admitting: Physician Assistant

## 2018-01-06 MED ORDER — SUVOREXANT 20 MG PO TABS: 1.0000 | ORAL_TABLET | Freq: Every day | ORAL | 2 refills | Status: DC

## 2018-01-06 NOTE — Telephone Encounter (Signed)
Rx printed and signed. Faxed to pharmacy since e-scribing is down currently.

## 2018-01-06 NOTE — Telephone Encounter (Signed)
Last OV: 08/10/2017 Last Fill: 09/29/2017, #30 given with 2 RF

## 2018-01-06 NOTE — Addendum Note (Signed)
Addended byDene Gentry: PETERMAN, AMY M on: 01/06/2018 09:35 AM   Modules accepted: Orders

## 2018-01-20 ENCOUNTER — Encounter: Payer: Self-pay | Admitting: Family Medicine

## 2018-01-20 ENCOUNTER — Other Ambulatory Visit: Payer: Self-pay

## 2018-01-20 ENCOUNTER — Ambulatory Visit (INDEPENDENT_AMBULATORY_CARE_PROVIDER_SITE_OTHER): Payer: BLUE CROSS/BLUE SHIELD | Admitting: Family Medicine

## 2018-01-20 VITALS — BP 122/83 | HR 112 | Temp 98.9°F | Resp 16 | Ht 64.0 in | Wt 277.1 lb

## 2018-01-20 DIAGNOSIS — R300 Dysuria: Secondary | ICD-10-CM | POA: Diagnosis not present

## 2018-01-20 LAB — POCT URINALYSIS DIPSTICK
Bilirubin, UA: NEGATIVE
Glucose, UA: NEGATIVE
Ketones, UA: POSITIVE
Nitrite, UA: NEGATIVE
Protein, UA: POSITIVE — AB
Spec Grav, UA: 1.02 (ref 1.010–1.025)
Urobilinogen, UA: 0.2 E.U./dL
pH, UA: 6 (ref 5.0–8.0)

## 2018-01-20 MED ORDER — CEPHALEXIN 500 MG PO CAPS: 500.0000 mg | ORAL_CAPSULE | Freq: Two times a day (BID) | ORAL | 0 refills | Status: AC

## 2018-01-20 NOTE — Progress Notes (Signed)
   Subjective:    Patient ID: [name], female    DOB: Aug 09, 1985, 32 y.o.   MRN: [id number]  HPI UTI- 'pretty sure I have a UTI'.  Started w/ burning 'Sunday into Monday'.  Attempted to 'flush it out w/ cranberry juice- that didn't work'.  + frequency, dysuria, bladder spasms.  No blood in urine.  Pt reports this feels similar to previous infxns.  'just feel like crap'.  Hx of IC.   Review of Systems For ROS see HPI     Objective:   Physical Exam Vitals signs reviewed.  Constitutional:      General: She is not in acute distress.    Appearance: She is well-developed.  Abdominal:     General: There is no distension.     Palpations: Abdomen is soft.     Tenderness: There is abdominal tenderness (+ suprapubic but no CVA tenderness ).           Assessment & Plan:  Dysuria- sxs and UA consistent w/ infxn.  Start Keflex twice daily.  Adjust meds prn based on cx results.  Pt expressed understanding and is in agreement w/ plan.

## 2018-01-20 NOTE — Patient Instructions (Signed)
Follow up as needed or as scheduled START the Cephalexin twice daily- take w/ food Drink plenty of fluids Call with any questions or concerns Hang in there! Happy New Year!

## 2018-01-22 LAB — URINE CULTURE
MICRO NUMBER:: 91542616
SPECIMEN QUALITY:: ADEQUATE

## 2018-01-27 ENCOUNTER — Encounter: Payer: Self-pay | Admitting: Family Medicine

## 2018-02-23 ENCOUNTER — Encounter: Payer: Self-pay | Admitting: Physician Assistant

## 2018-03-02 ENCOUNTER — Other Ambulatory Visit: Payer: Self-pay | Admitting: Physician Assistant

## 2018-03-02 MED ORDER — SUVOREXANT 20 MG PO TABS: 1.0000 | ORAL_TABLET | Freq: Every day | ORAL | 2 refills | Status: DC

## 2018-03-04 ENCOUNTER — Ambulatory Visit (INDEPENDENT_AMBULATORY_CARE_PROVIDER_SITE_OTHER): Payer: BLUE CROSS/BLUE SHIELD | Admitting: Physician Assistant

## 2018-03-04 ENCOUNTER — Encounter: Payer: Self-pay | Admitting: Physician Assistant

## 2018-03-04 ENCOUNTER — Ambulatory Visit: Payer: Self-pay

## 2018-03-04 ENCOUNTER — Other Ambulatory Visit: Payer: Self-pay

## 2018-03-04 VITALS — BP 118/90 | HR 104 | Temp 98.3°F | Resp 16 | Ht 64.0 in | Wt 280.0 lb

## 2018-03-04 DIAGNOSIS — H8113 Benign paroxysmal vertigo, bilateral: Secondary | ICD-10-CM | POA: Diagnosis not present

## 2018-03-04 DIAGNOSIS — B9789 Other viral agents as the cause of diseases classified elsewhere: Secondary | ICD-10-CM | POA: Diagnosis not present

## 2018-03-04 DIAGNOSIS — J069 Acute upper respiratory infection, unspecified: Secondary | ICD-10-CM | POA: Diagnosis not present

## 2018-03-04 MED ORDER — MECLIZINE HCL 25 MG PO TABS: 25.0000 mg | ORAL_TABLET | Freq: Three times a day (TID) | ORAL | 0 refills | Status: DC | PRN

## 2018-03-04 NOTE — Patient Instructions (Signed)
Please keep hydrated and get plenty of rest.  Limit salt intake.  Resume Flonase daily. Stop Xyzal and start Claritin-D or Zyrtec-D  Use the meclizine as directed if needed.

## 2018-03-04 NOTE — Telephone Encounter (Signed)
Pt called to say she is at work and has been treating herself for an URI for several days.  Today she has developed Vertigo.  She has pressure in her left ear and pain yesterday in the left ear. Today she feels nauseated with a spinning sensation. She has had vertigo in the past but states it has been along time ago. Appointment scheduled per protocol.  Care advice read to patient. Pt verbalized understanding of all instructions.  Reason for Disposition . Earache  Answer Assessment - Initial Assessment Questions 1. DESCRIPTION: "Describe your dizziness."     spinning 2. VERTIGO: "Do you feel like either you or the room is spinning or tilting?"    Spinning 3. LIGHTHEADED: "Do you feel lightheaded?" (e.g., somewhat faint, woozy, weak upon standing)     sometimes 4. SEVERITY: "How bad is it?"  "Can you walk?"   - MILD - Feels unsteady but walking normally.   - MODERATE - Feels very unsteady when walking, but not falling; interferes with normal activities (e.g., school, work) .   - SEVERE - Unable to walk without falling (requires assistance).  mild  5. ONSET:  "When did the dizziness begin?"     This AM 6. AGGRAVATING FACTORS: "Does anything make it worse?" (e.g., standing, change in head position)   Turning suddenly 7. CAUSE: "What do you think is causing the dizziness?"    Sinus UTI 8. RECURRENT SYMPTOM: "Have you had dizziness before?" If so, ask: "When was the last time?" "What happened that time?"    A long time ago 9. OTHER SYMPTOMS: "Do you have any other symptoms?" (e.g., headache, weakness, numbness, vomiting, earache)     Ear pian pressure left ear,nausea 10. PREGNANCY: "Is there any chance you are pregnant?" "When was your last menstrual period?"     No birth control  Protocols used: DIZZINESS - VERTIGO-A-AH

## 2018-03-04 NOTE — Progress Notes (Signed)
Acute Office Visit  Subjective:    Patient ID: [name], female    DOB: 06-20-1985, 33 y.o.   MRN: [id number]  Chief Complaint  Patient presents with  . Dizziness   Patient is in today for assessment of dizziness. Notes having congestion, sore throat, left ear pain that she notes is resolving. This morning noting pressure in ears bilaterally and dizziness with positional change. Notes mild nausea as well without vomiting. Denies fever, chills, chest pain or SOB. Denies vision changes or photophobia. Denies tinnitus, ear drainage.   Past Medical History:  Diagnosis Date  . Allergy   . Anxiety   . Depression   . GERD (gastroesophageal reflux disease)   . IBS (irritable bowel syndrome)   . Migraine   . Overactive bladder   . PCOS (polycystic ovarian syndrome)   . Pelvic pain   . Pseudotumor cerebri   . SUI (stress urinary incontinence, female)   . Urinary incontinence     Past Surgical History:  Procedure Laterality Date  . CESAREAN SECTION  2009  &  2011   2011 W/ BILATERAL TUBAL LIGATION  . CHOLECYSTECTOMY  2010  . CYSTO WITH HYDRODISTENSION  03/01/2012   Procedure: CYSTOSCOPY/HYDRODISTENSION;  Surgeon: Martina SinnerScott A MacDiarmid, MD;  [facility] [place];  Service: Urology;  Laterality: N/A;  INSTILLATION of marcaine and pyridium.  . PUBOVAGINAL SLING N/A 02/14/2013   Procedure: PUBO-VAGINAL SLING AND HYDRODISTENSION;  Surgeon: Martina SinnerScott A MacDiarmid, MD;  [facility] [place];  Service: Urology;  Laterality: N/A;  . TRANSTHORACIC ECHOCARDIOGRAM  06/2009  . TUBAL LIGATION      Family History  Problem Relation Age of Onset  . Hypertension Mother   . Depression Mother   . Hypertension Father   . Cancer Father        kidney  . Depression Father   . Cancer Maternal Uncle        stomach  . Hypertension Maternal Grandmother   . Cancer Maternal Grandmother        kidney, bone  . Hypertension Maternal Grandfather   . Cancer Maternal  Grandfather        lung, bone  . Stroke Maternal Grandfather   . Hypertension Paternal Grandmother   . Hypertension Paternal Grandfather   . Stroke Paternal Grandfather     Social History   Socioeconomic History  . Marital status: Married    Spouse name: Not on file  . Number of children: Not on file  . Years of education: Not on file  . Highest education level: Not on file  Occupational History  . Occupation: Nurse    Employer: [employer]  Social Needs  . Financial resource strain: Not on file  . Food insecurity:    Worry: Not on file    Inability: Not on file  . Transportation needs:    Medical: Not on file    Non-medical: Not on file  Tobacco Use  . Smoking status: Never Smoker  . Smokeless tobacco: Never Used  Substance and Sexual Activity  . Alcohol use: No  . Drug use: No  . Sexual activity: Yes    Birth control/protection: Surgical  Lifestyle  . Physical activity:    Days per week: Not on file    Minutes per session: Not on file  . Stress: Not on file  Relationships  . Social connections:    Talks on phone: Not on file    Gets together: Not on file  Attends religious service: Not on file    Active member of club or organization: Not on file    Attends meetings of clubs or organizations: Not on file    Relationship status: Not on file  . Intimate partner violence:    Fear of current or ex partner: Not on file    Emotionally abused: Not on file    Physically abused: Not on file    Forced sexual activity: Not on file  Other Topics Concern  . Not on file  Social History Narrative  . Not on file    Outpatient Medications Prior to Visit  Medication Sig Dispense Refill  . amitriptyline (ELAVIL) 25 MG tablet TAKE 2 TABLETS BY MOUTH AT BEDTIME 60 tablet 5  . butalbital-acetaminophen-caffeine (FIORICET, ESGIC) 50-325-40 MG per tablet Take 1 tablet by mouth 2 (two) times daily as needed for headache.    . cimetidine (TAGAMET) 200 MG tablet Take 200 mg  by mouth 2 (two) times daily.    Marland Kitchen. dicyclomine (BENTYL) 10 MG capsule Take 10 mg by mouth as needed for spasms.    . fluticasone (FLONASE) 50 MCG/ACT nasal spray Place 2 sprays into both nostrils as needed.     Marland Kitchen. levocetirizine (XYZAL) 5 MG tablet Take 5 mg by mouth every evening.  5  . Melatonin 10 MG TABS Take 1 tablet by mouth at bedtime.    . Multiple Vitamin (MULTIVITAMIN) tablet Take 1 tablet by mouth daily.    . ondansetron (ZOFRAN) 4 MG tablet Take 1 tablet (4 mg total) by mouth every 8 (eight) hours as needed for nausea or vomiting. 20 tablet 0  . Probiotic Product (PROBIOTIC-10 PO) Take 1 tablet by mouth 2 (two) times daily.     Marland Kitchen. spironolactone (ALDACTONE) 100 MG tablet Take 100 mg by mouth daily.  4  . SPRINTEC 28 0.25-35 MG-MCG tablet Take 1 tablet by mouth daily.  4  . SUMAtriptan (IMITREX) 50 MG tablet Take 1 tablet (50 mg total) by mouth once for 1 dose. May repeat in 2 hours if headache persists or recurs. 10 tablet 0  . Suvorexant (BELSOMRA) 20 MG TABS Take 1 tablet by mouth at bedtime. 30 tablet 2  . venlafaxine XR (EFFEXOR-XR) 150 MG 24 hr capsule TAKE ONE CAPSULE BY MOUTH EVERY MORNING WITH BREAKFAST 90 capsule 1  . vitamin B-12 (CYANOCOBALAMIN) 1000 MCG tablet Take 1,000 mcg by mouth daily.     No facility-administered medications prior to visit.     No Known Allergies  Review of Systems  Neurological: Positive for dizziness.       Objective:    Physical Exam  Constitutional: She appears well-developed and well-nourished.  HENT:  Right Ear: Hearing normal. Tympanic membrane is bulging. A middle ear effusion is present.  Left Ear: Hearing normal. Tympanic membrane is bulging. A middle ear effusion is present.  Mouth/Throat: Uvula is midline, oropharynx is clear and moist and mucous membranes are normal.  Cardiovascular: Normal rate, regular rhythm and normal heart sounds.  Pulmonary/Chest: Effort normal and breath sounds normal.    BP 118/90   Pulse (!) 104    Temp 98.3 F (36.8 C) (Oral)   Resp 16   Ht 5\' 4"  (1.626 m)   Wt 127 kg   SpO2 98%   BMI 48.06 kg/m  Wt Readings from Last 3 Encounters:  03/04/18 127 kg  01/20/18 125.7 kg  02/14/13 123.4 kg    There are no preventive care reminders to display for this  patient.  There are no preventive care reminders to display for this patient.   Lab Results  Component Value Date   TSH 3.24 07/19/2017   Lab Results  Component Value Date   WBC 8.5 07/06/2017   HGB 13.5 07/06/2017   HCT 40 07/06/2017   MCV 94.1 05/05/2016   PLT 35 (A) 07/06/2017   Lab Results  Component Value Date   NA 138 07/06/2017   K 4.1 07/06/2017   CO2 25 05/05/2016   GLUCOSE 102 (H) 05/05/2016   BUN 8 07/06/2017   CREATININE 0.8 07/06/2017   BILITOT 0.3 05/05/2016   ALKPHOS 54 07/06/2017   AST 12 (A) 07/06/2017   ALT 18 07/06/2017   PROT 6.6 05/05/2016   ALBUMIN 3.7 05/05/2016   CALCIUM 9.0 05/05/2016   ANIONGAP 7 02/23/2014   GFR 90.45 05/05/2016   Lab Results  Component Value Date   CHOL 111 07/06/2017   Lab Results  Component Value Date   HDL 50 07/06/2017   Lab Results  Component Value Date   LDLCALC 35 07/06/2017   Lab Results  Component Value Date   TRIG 129 07/06/2017   Lab Results  Component Value Date   CHOLHDL 2 05/05/2016   Lab Results  Component Value Date   HGBA1C 5.5 07/06/2017       Assessment & Plan:   1. Benign paroxysmal positional vertigo due to bilateral vestibular disorder Supportive measures reviewed. Begin Antivert. Restart Flonase and begin Claritin-D to dry the serous fluid behind the ears. Return precautions reviewed.  - meclizine (ANTIVERT) 25 MG tablet; Take 1 tablet (25 mg total) by mouth 3 (three) times daily as needed for dizziness.  Dispense: 30 tablet; Refill: 0  2. Viral URI with cough Improving/resolving. Supportive measures discussed. Reassurance given. Follow-up if not continuing to resolve.    No orders of the defined types were placed  in this encounter.    Erin E Mecum, Student-PA

## 2018-03-31 DIAGNOSIS — J020 Streptococcal pharyngitis: Secondary | ICD-10-CM | POA: Diagnosis not present

## 2018-04-03 ENCOUNTER — Other Ambulatory Visit: Payer: Self-pay | Admitting: Physician Assistant

## 2018-04-20 ENCOUNTER — Telehealth: Payer: BLUE CROSS/BLUE SHIELD | Admitting: Family

## 2018-04-20 DIAGNOSIS — J069 Acute upper respiratory infection, unspecified: Secondary | ICD-10-CM

## 2018-04-20 DIAGNOSIS — J029 Acute pharyngitis, unspecified: Secondary | ICD-10-CM | POA: Diagnosis not present

## 2018-04-20 MED ORDER — FLUTICASONE PROPIONATE 50 MCG/ACT NA SUSP: 2.0000 | Freq: Every day | NASAL | 6 refills | Status: AC

## 2018-04-20 NOTE — Progress Notes (Signed)
We are sorry you are not feeling well.  Here is how we plan to help!  Based on what you have shared with me, it looks like you may have a viral upper respiratory infection.  Upper respiratory infections are caused by a large number of viruses; however, rhinovirus is the most common cause.   Symptoms vary from person to person, with common symptoms including sore throat, cough, and fatigue or lack of energy.  A low-grade fever of up to 100.4 may present, but is often uncommon.  Symptoms vary however, and are closely related to a person's age or underlying illnesses.  The most common symptoms associated with an upper respiratory infection are nasal discharge or congestion, cough, sneezing, headache and pressure in the ears and face.  These symptoms usually persist for about 3 to 10 days, but can last up to 2 weeks.  It is important to know that upper respiratory infections do not cause serious illness or complications in most cases.    Upper respiratory infections can be transmitted from person to person, with the most common method of transmission being a person's hands.  The virus is able to live on the skin and can infect other persons for up to 2 hours after direct contact.  Also, these can be transmitted when someone coughs or sneezes; thus, it is important to cover the mouth to reduce this risk.  To keep the spread of the illness at bay, good hand hygiene is very important.  This is an infection that is most likely caused by a virus. There are no specific treatments other than to help you with the symptoms until the infection runs its course.  We are sorry you are not feeling well.  Here is how we plan to help!   For nasal congestion, you may use an oral decongestants such as Mucinex D or if you have glaucoma or high blood pressure use plain Mucinex.  Saline nasal spray or nasal drops can help and can safely be used as often as needed for congestion.  For your congestion, I have prescribed Fluticasone  nasal spray one spray in each nostril twice a day  Did you make sure you got a new toothbrush after starting your antibiotics?   Approximately 5 minutes was spent documenting and reviewing patient's chart.   If you have a sore or scratchy throat, use a saltwater gargle-  to  teaspoon of salt dissolved in a 4-ounce to 8-ounce glass of warm water.  Gargle the solution for approximately 15-30 seconds and then spit.  It is important not to swallow the solution.  You can also use throat lozenges/cough drops and Chloraseptic spray to help with throat pain or discomfort.  Warm or cold liquids can also be helpful in relieving throat pain.  For headache, pain or general discomfort, you can use Ibuprofen or Tylenol as directed.   Some authorities believe that zinc sprays or the use of Echinacea may shorten the course of your symptoms.   HOME CARE . Only take medications as instructed by your medical team. . Be sure to drink plenty of fluids. Water is fine as well as fruit juices, sodas and electrolyte beverages. You may want to stay away from caffeine or alcohol. If you are nauseated, try taking small sips of liquids. How do you know if you are getting enough fluid? Your urine should be a pale yellow or almost colorless. . Get rest. . Taking a steamy shower or using a humidifier may help  nasal congestion and ease sore throat pain. You can place a towel over your head and breathe in the steam from hot water coming from a faucet. . Using a saline nasal spray works much the same way. . Cough drops, hard candies and sore throat lozenges may ease your cough. . Avoid close contacts especially the very young and the elderly . Cover your mouth if you cough or sneeze . Always remember to wash your hands.   GET HELP RIGHT AWAY IF: . You develop worsening fever. . If your symptoms do not improve within 10 days . You develop yellow or green discharge from your nose over 3 days. . You have coughing fits . You  develop a severe head ache or visual changes. . You develop shortness of breath, difficulty breathing or start having chest pain . Your symptoms persist after you have completed your treatment plan  MAKE SURE YOU   Understand these instructions.  Will watch your condition.  Will get help right away if you are not doing well or get worse.  Your e-visit answers were reviewed by a board certified advanced clinical practitioner to complete your personal care plan. Depending upon the condition, your plan could have included both over the counter or prescription medications. Please review your pharmacy choice. If there is a problem, you may call our nursing hot line at and have the prescription routed to another pharmacy. Your safety is important to [place]. If you have drug allergies check your prescription carefully.   You can use MyChart to ask questions about today's visit, request a non-urgent call back, or ask for a work or school excuse for 24 hours related to this e-Visit. If it has been greater than 24 hours you will need to follow up with your provider, or enter a new e-Visit to address those concerns. You will get an e-mail in the next two days asking about your experience.  I hope that your e-visit has been valuable and will speed your recovery. Thank you for using e-visits.

## 2018-04-27 ENCOUNTER — Encounter: Payer: Self-pay | Admitting: Emergency Medicine

## 2018-04-27 ENCOUNTER — Other Ambulatory Visit: Payer: Self-pay | Admitting: Physician Assistant

## 2018-04-27 NOTE — Telephone Encounter (Signed)
Patient is overdue for Depression follow up. My chart message sent

## 2018-04-28 ENCOUNTER — Encounter: Payer: Self-pay | Admitting: Emergency Medicine

## 2018-04-29 ENCOUNTER — Encounter: Payer: Self-pay | Admitting: Physician Assistant

## 2018-04-29 ENCOUNTER — Ambulatory Visit (INDEPENDENT_AMBULATORY_CARE_PROVIDER_SITE_OTHER): Payer: BLUE CROSS/BLUE SHIELD | Admitting: Physician Assistant

## 2018-04-29 ENCOUNTER — Other Ambulatory Visit: Payer: Self-pay

## 2018-04-29 VITALS — BP 125/81 | HR 87 | Temp 98.9°F | Resp 16

## 2018-04-29 DIAGNOSIS — F419 Anxiety disorder, unspecified: Secondary | ICD-10-CM | POA: Diagnosis not present

## 2018-04-29 DIAGNOSIS — F329 Major depressive disorder, single episode, unspecified: Secondary | ICD-10-CM

## 2018-04-29 DIAGNOSIS — F32A Depression, unspecified: Secondary | ICD-10-CM

## 2018-04-29 MED ORDER — VENLAFAXINE HCL ER 150 MG PO CP24: ORAL_CAPSULE | ORAL | 1 refills | Status: DC

## 2018-04-29 MED ORDER — TEMAZEPAM 15 MG PO CAPS: 15.0000 mg | ORAL_CAPSULE | Freq: Every evening | ORAL | 0 refills | Status: DC | PRN

## 2018-04-29 NOTE — Progress Notes (Signed)
Virtual Visit via Video Note I connected with [name] on 04/29/18 at 11:00 AM EDT by a video enabled telemedicine application and verified that I am speaking with the correct person using two identifiers.   I discussed the limitations of evaluation and management by telemedicine and the availability of in person appointments. The patient expressed understanding and agreed to proceed.  History of Present Illness: Patient presents today via WebEx for follow-up of anxiety and depression. Patient is currently on a regimen of Venlafaxine XR 150 mg daily and Amitriptyline 50 mg nightly. Has been on this regimen for some time and does very well. Patient endorses taking medications as directed and still feels like she is doing very well, especially considering everything going on currently. Denies SI/HI. Denies breakthrough symptoms. Notes that her  Belsomra has been 57.00 month as insurance will no longer cover. Covered alternatives were given to her.    Observations/Objective: Patient is well-developed, well-nourished in no acute distress.  Resting comfortably in chair. Head is normocephalic, atraumatic.  No labored breathing.  Speech is clear and coherent with logical contest.  Patient is alert and oriented at baseline.   Assessment and Plan: Anxiety and depression Doing very well on current regimen of Effexor and Elavil. Has for some time. No history of serotonin syndrome with combination. Is only thing that has worked for her. Will continue. Belsomra is too expensive so will stop and attempt trial of Restoril 15 mg. Follow-up 3 months for CPE.   Follow Up Instructions: Follow-up in June/July for CPE and fasting labs.   I discussed the assessment and treatment plan with the patient. The patient was provided an opportunity to ask questions and all were answered. The patient agreed with the plan and demonstrated an understanding of the instructions.   The patient was advised to call  back or seek an in-person evaluation if the symptoms worsen or if the condition fails to improve as anticipated.   Piedad Climes, PA-C

## 2018-04-29 NOTE — Progress Notes (Signed)
I have discussed the procedure for the virtual visit with the patient who has given consent to proceed with assessment and treatment.   [name] S Mira Balon, CMA     

## 2018-04-29 NOTE — Assessment & Plan Note (Signed)
Doing very well on current regimen of Effexor and Elavil. Has for some time. No history of serotonin syndrome with combination. Is only thing that has worked for her. Will continue. Belsomra is too expensive so will stop and attempt trial of Restoril 15 mg. Follow-up 3 months for CPE.

## 2018-05-20 ENCOUNTER — Encounter: Payer: Self-pay | Admitting: Physician Assistant

## 2018-05-20 ENCOUNTER — Other Ambulatory Visit: Payer: Self-pay

## 2018-05-20 ENCOUNTER — Ambulatory Visit (INDEPENDENT_AMBULATORY_CARE_PROVIDER_SITE_OTHER): Payer: BLUE CROSS/BLUE SHIELD | Admitting: Physician Assistant

## 2018-05-20 VITALS — BP 126/80 | HR 111 | Temp 99.3°F | Ht 64.0 in | Wt 280.0 lb

## 2018-05-20 DIAGNOSIS — R300 Dysuria: Secondary | ICD-10-CM

## 2018-05-20 MED ORDER — CIPROFLOXACIN HCL 500 MG PO TABS: 500.0000 mg | ORAL_TABLET | Freq: Two times a day (BID) | ORAL | 0 refills | Status: DC

## 2018-05-20 NOTE — Progress Notes (Signed)
I have discussed the procedure for the virtual visit with the patient who has given consent to proceed with assessment and treatment.   Karlye Ihrig S Quinesha Selinger, CMA     

## 2018-05-20 NOTE — Telephone Encounter (Signed)
Please help patient set up Video Visit. See MyChart message. Thank you.

## 2018-05-20 NOTE — Patient Instructions (Signed)
Instructions sent to MyChart.   Your symptoms are consistent with a bladder infection, also called acute cystitis. Please take your antibiotic (Cipro) as directed until all pills are gone.  Stay very well hydrated.  Consider a daily probiotic (Align, Culturelle, or Activia) to help prevent stomach upset caused by the antibiotic.  Taking a probiotic daily may also help prevent recurrent UTIs.  Also consider taking AZO (Phenazopyridine) tablets to help decrease pain with urination.   Call or return to clinic if symptoms are not resolved by completion of antibiotic as we will need to send urine sample for culture.   Urinary Tract Infection A urinary tract infection (UTI) can occur any place along the urinary tract. The tract includes the kidneys, ureters, bladder, and urethra. A type of germ called bacteria often causes a UTI. UTIs are often helped with antibiotic medicine.  HOME CARE   If given, take antibiotics as told by your doctor. Finish them even if you start to feel better.  Drink enough fluids to keep your pee (urine) clear or pale yellow.  Avoid tea, drinks with caffeine, and bubbly (carbonated) drinks.  Pee often. Avoid holding your pee in for a long time.  Pee before and after having sex (intercourse).  Wipe from front to back after you poop (bowel movement) if you are a woman. Use each tissue only once. GET HELP RIGHT AWAY IF:   You have back pain.  You have lower belly (abdominal) pain.  You have chills.  You feel sick to your stomach (nauseous).  You throw up (vomit).  Your burning or discomfort with peeing does not go away.  You have a fever.  Your symptoms are not better in 3 days. MAKE SURE YOU:   Understand these instructions.  Will watch your condition.  Will get help right away if you are not doing well or get worse. Document Released: 07/01/2007 Document Revised: 10/07/2011 Document Reviewed: 08/13/2011 [facility] Patient Information 2015 [place],  [place]. This information is not intended to replace advice given to you by your health care provider. Make sure you discuss any questions you have with your health care provider.

## 2018-05-20 NOTE — Progress Notes (Signed)
Virtual Visit via Video   I connected with patient on 05/20/18 at  1:40 PM EDT by a video enabled telemedicine application and verified that I am speaking with the correct person using two identifiers.  Location patient: Home Location provider: [facility] [facility], Office Persons participating in the virtual visit: Patient, Provider, CMA (Patina Moore)  I discussed the limitations of evaluation and management by telemedicine and the availability of in person appointments. The patient expressed understanding and agreed to proceed.  Subjective:   HPI:   Patient presents via Doxy/me for assessment of potential UTI. Notes 4 days of urinary urgency and frequency with dysuria. Notes suprapubic pressure and pain with low-grade fever today only. Denies flank pain. Denies vaginal discharge or pain. Denies hematuria or urinary hesitancy. Has significant history of UTI.  ROS:   See pertinent positives and negatives per HPI.  Patient Active Problem List   Diagnosis Date Noted  . Elevated TSH 07/19/2017  . Fatigue 07/19/2017  . Visit for preventive health examination 05/05/2016  . Anxiety and depression 01/18/2016  . Insomnia 01/18/2016  . Chronic migraine 01/18/2016  . CARDIOMEGALY 06/13/2009    Social History   Tobacco Use  . Smoking status: Never Smoker  . Smokeless tobacco: Never Used  Substance Use Topics  . Alcohol use: No    Current Outpatient Medications:  .  amitriptyline (ELAVIL) 25 MG tablet, TAKE 2 TABLETS BY MOUTH AT BEDTIME, Disp: 180 tablet, Rfl: 0 .  butalbital-acetaminophen-caffeine (FIORICET, ESGIC) 50-325-40 MG per tablet, Take 1 tablet by mouth 2 (two) times daily as needed for headache., Disp: , Rfl:  .  cimetidine (TAGAMET) 200 MG tablet, Take 200 mg by mouth 2 (two) times daily., Disp: , Rfl:  .  dicyclomine (BENTYL) 10 MG capsule, Take 10 mg by mouth as needed for spasms., Disp: , Rfl:  .  fluticasone (FLONASE) 50 MCG/ACT nasal spray, Place 2 sprays into  both nostrils daily., Disp: 16 g, Rfl: 6 .  levocetirizine (XYZAL) 5 MG tablet, Take 5 mg by mouth every evening., Disp: , Rfl: 5 .  meclizine (ANTIVERT) 25 MG tablet, Take 1 tablet (25 mg total) by mouth 3 (three) times daily as needed for dizziness., Disp: 30 tablet, Rfl: 0 .  Melatonin 10 MG TABS, Take 1 tablet by mouth at bedtime., Disp: , Rfl:  .  Multiple Vitamin (MULTIVITAMIN) tablet, Take 1 tablet by mouth daily., Disp: , Rfl:  .  Probiotic Product (PROBIOTIC-10 PO), Take 1 tablet by mouth 2 (two) times daily. , Disp: , Rfl:  .  spironolactone (ALDACTONE) 100 MG tablet, Take 100 mg by mouth daily., Disp: , Rfl: 4 .  SPRINTEC 28 0.25-35 MG-MCG tablet, Take 1 tablet by mouth daily., Disp: , Rfl: 4 .  SUMAtriptan (IMITREX) 50 MG tablet, Take 1 tablet (50 mg total) by mouth once for 1 dose. May repeat in 2 hours if headache persists or recurs., Disp: 10 tablet, Rfl: 0 .  temazepam (RESTORIL) 15 MG capsule, Take 1 capsule (15 mg total) by mouth at bedtime as needed for sleep., Disp: 30 capsule, Rfl: 0 .  venlafaxine XR (EFFEXOR-XR) 150 MG 24 hr capsule, TAKE ONE CAPSULE BY MOUTH EVERY MORNING WITH BREAKFAST, Disp: 90 capsule, Rfl: 1 .  vitamin B-12 (CYANOCOBALAMIN) 1000 MCG tablet, Take 1,000 mcg by mouth daily., Disp: , Rfl:   No Known Allergies  Objective:   BP 126/80   Pulse (!) 111   Temp 99.3 F (37.4 C) (Oral)   Ht 5\' 4"  (  1.626 m)   Wt 280 lb (127 kg)   SpO2 96%   BMI 48.06 kg/m   Patient is well-developed, well-nourished in no acute distress.  Resting comfortably at home.  Head is normocephalic, atraumatic.  No labored breathing.  Speech is clear and coherent with logical contest.  Patient is alert and oriented at baseline.   Assessment and Plan:      1. Dysuria Urine dip at work + LE and questionable nitrites. + history of UTI with classic symptoms. Recent antibiotics (Cefdinir and Augmentin) so will start Cipro 500 mg BID x 5 days. She is to come in for culture if  symptoms are not resolving. Alarm signs/symptoms reviewed that would prompt need for ER assessment.   - ciprofloxacin (CIPRO) 500 MG tablet; Take 1 tablet (500 mg total) by mouth 2 (two) times daily.  Dispense: 10 tablet; Refill: 0    Piedad ClimesWilliam Cody Dagen Beevers, [place]PA-C 05/20/2018

## 2018-05-24 ENCOUNTER — Ambulatory Visit (INDEPENDENT_AMBULATORY_CARE_PROVIDER_SITE_OTHER): Payer: BLUE CROSS/BLUE SHIELD | Admitting: Physician Assistant

## 2018-05-24 ENCOUNTER — Other Ambulatory Visit: Payer: Self-pay

## 2018-05-24 ENCOUNTER — Encounter: Payer: Self-pay | Admitting: Physician Assistant

## 2018-05-24 ENCOUNTER — Telehealth: Payer: Self-pay | Admitting: Physician Assistant

## 2018-05-24 VITALS — BP 130/84 | HR 118 | Temp 98.9°F | Resp 16 | Ht 64.0 in | Wt 280.0 lb

## 2018-05-24 DIAGNOSIS — R109 Unspecified abdominal pain: Secondary | ICD-10-CM | POA: Diagnosis not present

## 2018-05-24 DIAGNOSIS — R300 Dysuria: Secondary | ICD-10-CM

## 2018-05-24 LAB — POCT URINALYSIS DIPSTICK
Bilirubin, UA: NEGATIVE
Glucose, UA: NEGATIVE
Ketones, UA: NEGATIVE
Nitrite, UA: NEGATIVE
Protein, UA: NEGATIVE
Spec Grav, UA: 1.02 (ref 1.010–1.025)
Urobilinogen, UA: 0.2 E.U./dL
pH, UA: 6 (ref 5.0–8.0)

## 2018-05-24 MED ORDER — CEFTRIAXONE SODIUM 1 G IJ SOLR
1.0000 g | Freq: Once | INTRAMUSCULAR | Status: AC
  Administered 2018-05-24: 1 g via INTRAMUSCULAR

## 2018-05-24 NOTE — Addendum Note (Signed)
Addended by: Con Memos on: 05/24/2018 04:13 PM   Modules accepted: Orders

## 2018-05-24 NOTE — Patient Instructions (Signed)
Please go to the lab today for blood work.  I will call you with your results. We will alter treatment regimen(s) if indicated by your results.   Continue the Ciprofloxacin twice daily. The Rocephin injection today should help calm down potential infection. If your labs are unremarkable and you note symptoms aren't improving we will need to proceed with CT scan t further assess.   ER for any acutely worsening symptoms.

## 2018-05-24 NOTE — Progress Notes (Signed)
Patient presents to clinic today c/o continued urinary symptoms of dysuria and frequency after starting Cipro this weekend for suspected UTI. Notes these symptoms have improved but she has had some R-sided back pain since this morning with chills and nausea. Denies fever or vomiting. Denies trauma or injury. Again has history of UTI and IC. Denies history of nephrolithiasis. Has not taken anything for pain.   Past Medical History:  Diagnosis Date  . Allergy   . Anxiety   . Depression   . GERD (gastroesophageal reflux disease)   . IBS (irritable bowel syndrome)   . Migraine   . Overactive bladder   . PCOS (polycystic ovarian syndrome)   . Pelvic pain   . Pseudotumor cerebri   . SUI (stress urinary incontinence, female)   . Urinary incontinence     Current Outpatient Medications on File Prior to Visit  Medication Sig Dispense Refill  . amitriptyline (ELAVIL) 25 MG tablet TAKE 2 TABLETS BY MOUTH AT BEDTIME 180 tablet 0  . butalbital-acetaminophen-caffeine (FIORICET, ESGIC) 50-325-40 MG per tablet Take 1 tablet by mouth 2 (two) times daily as needed for headache.    . cimetidine (TAGAMET) 200 MG tablet Take 200 mg by mouth 2 (two) times daily.    . ciprofloxacin (CIPRO) 500 MG tablet Take 1 tablet (500 mg total) by mouth 2 (two) times daily. 10 tablet 0  . dicyclomine (BENTYL) 10 MG capsule Take 10 mg by mouth as needed for spasms.    . fluticasone (FLONASE) 50 MCG/ACT nasal spray Place 2 sprays into both nostrils daily. 16 g 6  . levocetirizine (XYZAL) 5 MG tablet Take 5 mg by mouth every evening.  5  . meclizine (ANTIVERT) 25 MG tablet Take 1 tablet (25 mg total) by mouth 3 (three) times daily as needed for dizziness. 30 tablet 0  . Melatonin 10 MG TABS Take 1 tablet by mouth at bedtime.    . Multiple Vitamin (MULTIVITAMIN) tablet Take 1 tablet by mouth daily.    . Probiotic Product (PROBIOTIC-10 PO) Take 1 tablet by mouth 2 (two) times daily.     Marland Kitchen spironolactone (ALDACTONE) 100 MG  tablet Take 100 mg by mouth daily.  4  . SPRINTEC 28 0.25-35 MG-MCG tablet Take 1 tablet by mouth daily.  4  . SUMAtriptan (IMITREX) 50 MG tablet Take 1 tablet (50 mg total) by mouth once for 1 dose. May repeat in 2 hours if headache persists or recurs. 10 tablet 0  . temazepam (RESTORIL) 15 MG capsule Take 1 capsule (15 mg total) by mouth at bedtime as needed for sleep. 30 capsule 0  . venlafaxine XR (EFFEXOR-XR) 150 MG 24 hr capsule TAKE ONE CAPSULE BY MOUTH EVERY MORNING WITH BREAKFAST 90 capsule 1  . vitamin B-12 (CYANOCOBALAMIN) 1000 MCG tablet Take 1,000 mcg by mouth daily.     No current facility-administered medications on file prior to visit.     No Known Allergies  Family History  Problem Relation Age of Onset  . Hypertension Mother   . Depression Mother   . Hypertension Father   . Cancer Father        kidney  . Depression Father   . Cancer Maternal Uncle        stomach  . Hypertension Maternal Grandmother   . Cancer Maternal Grandmother        kidney, bone  . Hypertension Maternal Grandfather   . Cancer Maternal Grandfather        lung, bone  .  Stroke Maternal Grandfather   . Hypertension Paternal Grandmother   . Hypertension Paternal Grandfather   . Stroke Paternal Grandfather     Social History   Socioeconomic History  . Marital status: Married    Spouse name: Not on file  . Number of children: Not on file  . Years of education: Not on file  . Highest education level: Not on file  Occupational History  . Occupation: Nurse    Employer: [employer]  Social Needs  . Financial resource strain: Not on file  . Food insecurity:    Worry: Not on file    Inability: Not on file  . Transportation needs:    Medical: Not on file    Non-medical: Not on file  Tobacco Use  . Smoking status: Never Smoker  . Smokeless tobacco: Never Used  Substance and Sexual Activity  . Alcohol use: No  . Drug use: No  . Sexual activity: Yes    Birth control/protection:  Surgical  Lifestyle  . Physical activity:    Days per week: Not on file    Minutes per session: Not on file  . Stress: Not on file  Relationships  . Social connections:    Talks on phone: Not on file    Gets together: Not on file    Attends religious service: Not on file    Active member of club or organization: Not on file    Attends meetings of clubs or organizations: Not on file    Relationship status: Not on file  Other Topics Concern  . Not on file  Social History Narrative  . Not on file   Review of Systems - See HPI.  All other ROS are negative.  BP 130/84   Pulse (!) 118   Temp 98.9 F (37.2 C) (Oral)   Resp 16   Ht 5\' 4"  (1.626 m)   Wt 280 lb (127 kg)   SpO2 98%   BMI 48.06 kg/m   Physical Exam Vitals signs reviewed.  Constitutional:      Appearance: Normal appearance.  HENT:     Head: Normocephalic and atraumatic.  Eyes:     Pupils: Pupils are equal, round, and reactive to light.  Cardiovascular:     Rate and Rhythm: Regular rhythm. Tachycardia present.     Pulses: Normal pulses.     Heart sounds: Normal heart sounds.  Pulmonary:     Effort: Pulmonary effort is normal.     Breath sounds: Normal breath sounds.  Abdominal:     General: Bowel sounds are normal.     Palpations: Abdomen is soft.     Tenderness: There is no abdominal tenderness. There is right CVA tenderness (questionable R CVA tenderness on examination). There is no left CVA tenderness.  Neurological:     Mental Status: She is alert.  Psychiatric:        Mood and Affect: Mood normal.    Assessment/Plan: 1. Dysuria 2. Right flank pain Urine dip + blood and LE. Negative Nitrites. Will send for culture. Some symptoms improving while new onset chills and low back pain. Question developing complicated UTI versus stone. 1 gm IM Rocephin given. CBC and BMP to be obtained today for review. CT if not improving significant within 24 hours (she declines today as she does not want to go out more  than needed. Agrees to have if symptoms not improving with treatment given). Continue Ciprofloxacin as directed for now.   - CBC w/Diff - Basic  metabolic panel   Leeanne Rio, PA-C

## 2018-05-24 NOTE — Telephone Encounter (Signed)
Patient is not experiencing any URI symptoms, no fevers or vomiting. Scheduled for 05/24/18 @230 

## 2018-05-24 NOTE — Telephone Encounter (Signed)
°  [name] states that she is having nausea, lower back pain and chills. She had a VV on 05/20/18 with Selena Batten, she has been taking the medication as directed. She states that she thinks it could be a kidney infection. She wanted to know if she should come in to give a urine sample or should we do another VV. Pt can be reached at the home #

## 2018-05-24 NOTE — Telephone Encounter (Signed)
She will absolutely need a urine test and culture. I am actually happy to see her in office today to assess her as long as she has absence of URI symptoms. Reassess and if she is having fever, vomiting with the flank pain though as I would recommend UC or ER setting so that she can get IM/IV antibiotics.

## 2018-05-25 ENCOUNTER — Other Ambulatory Visit: Payer: Self-pay | Admitting: Physician Assistant

## 2018-05-25 DIAGNOSIS — R1031 Right lower quadrant pain: Secondary | ICD-10-CM

## 2018-05-25 DIAGNOSIS — R109 Unspecified abdominal pain: Secondary | ICD-10-CM

## 2018-05-25 LAB — CBC WITH DIFFERENTIAL/PLATELET
Basophils Absolute: 0.1 10*3/uL (ref 0.0–0.1)
Basophils Relative: 0.8 % (ref 0.0–3.0)
Eosinophils Absolute: 0.1 10*3/uL (ref 0.0–0.7)
Eosinophils Relative: 1.3 % (ref 0.0–5.0)
HCT: 42.5 % (ref 36.0–46.0)
Hemoglobin: 14.4 g/dL (ref 12.0–15.0)
Lymphocytes Relative: 26.7 % (ref 12.0–46.0)
Lymphs Abs: 2.6 10*3/uL (ref 0.7–4.0)
MCHC: 33.8 g/dL (ref 30.0–36.0)
MCV: 93.9 fl (ref 78.0–100.0)
Monocytes Absolute: 0.5 10*3/uL (ref 0.1–1.0)
Monocytes Relative: 5.2 % (ref 3.0–12.0)
Neutro Abs: 6.4 10*3/uL (ref 1.4–7.7)
Neutrophils Relative %: 66 % (ref 43.0–77.0)
Platelets: 383 10*3/uL (ref 150.0–400.0)
RBC: 4.53 Mil/uL (ref 3.87–5.11)
RDW: 13.7 % (ref 11.5–15.5)
WBC: 9.7 10*3/uL (ref 4.0–10.5)

## 2018-05-25 LAB — BASIC METABOLIC PANEL
BUN: 10 mg/dL (ref 6–23)
CO2: 24 mEq/L (ref 19–32)
Calcium: 9.2 mg/dL (ref 8.4–10.5)
Chloride: 103 mEq/L (ref 96–112)
Creatinine, Ser: 0.82 mg/dL (ref 0.40–1.20)
GFR: 79.35 mL/min (ref >60.00)
Glucose, Bld: 105 mg/dL — ABNORMAL HIGH (ref 70–99)
Potassium: 4.2 mEq/L (ref 3.5–5.1)
Sodium: 137 mEq/L (ref 135–145)

## 2018-05-25 LAB — URINE CULTURE
MICRO NUMBER:: 428236
SPECIMEN QUALITY:: ADEQUATE

## 2018-05-30 ENCOUNTER — Ambulatory Visit
Admission: RE | Admit: 2018-05-30 | Discharge: 2018-05-30 | Disposition: A | Discharge status: Home / Self Care | Payer: BLUE CROSS/BLUE SHIELD | Source: Ambulatory Visit | Attending: Physician Assistant | Admitting: Physician Assistant

## 2018-05-30 ENCOUNTER — Other Ambulatory Visit: Payer: Self-pay

## 2018-05-30 DIAGNOSIS — R1031 Right lower quadrant pain: Secondary | ICD-10-CM | POA: Diagnosis not present

## 2018-05-30 DIAGNOSIS — R109 Unspecified abdominal pain: Secondary | ICD-10-CM

## 2018-05-31 ENCOUNTER — Other Ambulatory Visit: Payer: Self-pay | Admitting: Physician Assistant

## 2018-05-31 NOTE — Telephone Encounter (Signed)
Last rx for Temazepam 04/29/18 #30 Last OV: 05/24/18 Dysuria

## 2018-06-01 ENCOUNTER — Encounter: Payer: Self-pay | Admitting: Physician Assistant

## 2018-06-03 ENCOUNTER — Other Ambulatory Visit: Payer: BLUE CROSS/BLUE SHIELD

## 2018-07-05 ENCOUNTER — Other Ambulatory Visit: Payer: Self-pay | Admitting: Physician Assistant

## 2018-07-05 NOTE — Telephone Encounter (Signed)
Please advise of refills. Patient is due for CPE after 07/20/18.  LOV: 05/24/18 Dysuria/Flank pain  Temazepam last filled 05/31/18 #30

## 2018-07-05 NOTE — Telephone Encounter (Signed)
Will need CPE or Follow-up before additional refills can be given.

## 2018-07-06 ENCOUNTER — Encounter: Payer: Self-pay | Admitting: Emergency Medicine

## 2018-07-06 DIAGNOSIS — Z0000 Encounter for general adult medical examination without abnormal findings: Secondary | ICD-10-CM | POA: Diagnosis not present

## 2018-07-06 DIAGNOSIS — Z124 Encounter for screening for malignant neoplasm of cervix: Secondary | ICD-10-CM | POA: Diagnosis not present

## 2018-07-06 DIAGNOSIS — Z01419 Encounter for gynecological examination (general) (routine) without abnormal findings: Secondary | ICD-10-CM | POA: Diagnosis not present

## 2018-07-06 DIAGNOSIS — Z1151 Encounter for screening for human papillomavirus (HPV): Secondary | ICD-10-CM | POA: Diagnosis not present

## 2018-07-06 DIAGNOSIS — Z130 Encounter for screening for diseases of the blood and blood-forming organs and certain disorders involving the immune mechanism: Secondary | ICD-10-CM | POA: Diagnosis not present

## 2018-07-06 DIAGNOSIS — Z1329 Encounter for screening for other suspected endocrine disorder: Secondary | ICD-10-CM | POA: Diagnosis not present

## 2018-07-06 DIAGNOSIS — Z13220 Encounter for screening for lipoid disorders: Secondary | ICD-10-CM | POA: Diagnosis not present

## 2018-07-06 DIAGNOSIS — Z6841 Body Mass Index (BMI) 40.0 and over, adult: Secondary | ICD-10-CM | POA: Diagnosis not present

## 2018-07-06 DIAGNOSIS — Z131 Encounter for screening for diabetes mellitus: Secondary | ICD-10-CM | POA: Diagnosis not present

## 2018-07-12 LAB — LIPID PANEL
Cholesterol: 125 (ref 0–200)
HDL: 56 (ref 35–70)
LDL Cholesterol: 52
Triglycerides: 83 (ref 40–160)

## 2018-07-12 LAB — BASIC METABOLIC PANEL
BUN: 13 (ref 4–21)
Creatinine: 0.7 (ref 0.5–1.1)
Glucose: 107
Potassium: 4.4 (ref 3.4–5.3)
Sodium: 140 (ref 137–147)

## 2018-07-12 LAB — HEPATIC FUNCTION PANEL
ALT: 24 (ref 7–35)
AST: 15 (ref 13–35)
Alkaline Phosphatase: 54 (ref 25–125)
Bilirubin, Total: 0.2

## 2018-07-12 LAB — CBC AND DIFFERENTIAL
HCT: 41 (ref 36–46)
Hemoglobin: 13.5 (ref 12.0–16.0)
Platelets: 359 (ref 150–399)
WBC: 9.5

## 2018-07-12 LAB — TSH: TSH: 1.33 (ref 0.41–5.90)

## 2018-07-13 LAB — HM PAP SMEAR: HM Pap smear: NORMAL

## 2018-07-21 ENCOUNTER — Ambulatory Visit (INDEPENDENT_AMBULATORY_CARE_PROVIDER_SITE_OTHER): Payer: BC Managed Care – PPO | Admitting: Physician Assistant

## 2018-07-21 ENCOUNTER — Encounter: Payer: Self-pay | Admitting: Physician Assistant

## 2018-07-21 ENCOUNTER — Other Ambulatory Visit: Payer: Self-pay

## 2018-07-21 VITALS — BP 104/78 | HR 105 | Temp 98.0°F | Resp 16 | Ht 65.0 in | Wt 281.6 lb

## 2018-07-21 DIAGNOSIS — G43709 Chronic migraine without aura, not intractable, without status migrainosus: Secondary | ICD-10-CM | POA: Diagnosis not present

## 2018-07-21 DIAGNOSIS — F419 Anxiety disorder, unspecified: Secondary | ICD-10-CM | POA: Diagnosis not present

## 2018-07-21 DIAGNOSIS — Z0001 Encounter for general adult medical examination with abnormal findings: Secondary | ICD-10-CM | POA: Diagnosis not present

## 2018-07-21 DIAGNOSIS — G4700 Insomnia, unspecified: Secondary | ICD-10-CM

## 2018-07-21 DIAGNOSIS — F32A Depression, unspecified: Secondary | ICD-10-CM

## 2018-07-21 DIAGNOSIS — J351 Hypertrophy of tonsils: Secondary | ICD-10-CM | POA: Diagnosis not present

## 2018-07-21 DIAGNOSIS — Z0000 Encounter for general adult medical examination without abnormal findings: Secondary | ICD-10-CM

## 2018-07-21 DIAGNOSIS — IMO0002 Reserved for concepts with insufficient information to code with codable children: Secondary | ICD-10-CM

## 2018-07-21 DIAGNOSIS — F329 Major depressive disorder, single episode, unspecified: Secondary | ICD-10-CM

## 2018-07-21 NOTE — Progress Notes (Signed)
Patient presents to clinic today for annual exam.  Patient is fasting for labs.  Acute Concerns: Patient endorses recurrent and intermittent swelling of tonsils ever since strep in March. Denies trouble swallowing, pain, fever, chills. Notes some nasal drainage on occasion and notes tonsils seem to flare up with this.   Chronic Issues: Anxiety/Depression -- Patient is currently on a regimen of Effexor XR 150 mg QD and Amitriptyline 25 mg QHS (also for chronic migraine). Endorses taking medication daily, is able to tell it helps. No current issues. Denies any anhedonia, SI/HI  Insomnia -- Patient on Restoril 15 mg QHS PRN. Endorses sleeping well, 7-8 hrs, mostly wakes up feeling well rested. Feels as though the restoril and melatonin are helpful   GERD -- Taking Cimetidine PRN. Denies any heart burn or indigestion   Diet: 2-3 meals a day, set up with Dr. Bernestine AmassBeasely currently on weight list. Half eating out half eating in. Working on implementing more vegetables and more well balanced diet.   Exercise: no exercise. Has a boflex at home.   Health Maintenance: Immunizations -- up-to-date. PAP -- up-to-date. Recall 2022.  Past Medical History:  Diagnosis Date  . Allergy   . Anxiety   . Depression   . GERD (gastroesophageal reflux disease)   . IBS (irritable bowel syndrome)   . Migraine   . Overactive bladder   . PCOS (polycystic ovarian syndrome)   . Pelvic pain   . Pseudotumor cerebri   . SUI (stress urinary incontinence, female)   . Urinary incontinence     Past Surgical History:  Procedure Laterality Date  . CESAREAN SECTION  2009  &  2011   2011 W/ BILATERAL TUBAL LIGATION  . CHOLECYSTECTOMY  2010  . CYSTO WITH HYDRODISTENSION  03/01/2012   Procedure: CYSTOSCOPY/HYDRODISTENSION;  Surgeon: Martina SinnerScott A MacDiarmid, MD;  [facility] [place];  Service: Urology;  Laterality: N/A;  INSTILLATION of marcaine and pyridium.  . PUBOVAGINAL SLING N/A 02/14/2013   Procedure:  PUBO-VAGINAL SLING AND HYDRODISTENSION;  Surgeon: Martina SinnerScott A MacDiarmid, MD;  [facility] [place];  Service: Urology;  Laterality: N/A;  . TRANSTHORACIC ECHOCARDIOGRAM  06/2009  . TUBAL LIGATION      Current Outpatient Medications on File Prior to Visit  Medication Sig Dispense Refill  . amitriptyline (ELAVIL) 25 MG tablet TAKE 2 TABLETS BY MOUTH AT BEDTIME 180 tablet 0  . butalbital-acetaminophen-caffeine (FIORICET, ESGIC) 50-325-40 MG per tablet Take 1 tablet by mouth 2 (two) times daily as needed for headache.    . cimetidine (TAGAMET) 200 MG tablet Take 200 mg by mouth 2 (two) times daily.    . ciprofloxacin (CIPRO) 500 MG tablet Take 1 tablet (500 mg total) by mouth 2 (two) times daily. 10 tablet 0  . dicyclomine (BENTYL) 10 MG capsule Take 10 mg by mouth as needed for spasms.    . fluticasone (FLONASE) 50 MCG/ACT nasal spray Place 2 sprays into both nostrils daily. 16 g 6  . levocetirizine (XYZAL) 5 MG tablet Take 5 mg by mouth every evening.  5  . meclizine (ANTIVERT) 25 MG tablet Take 1 tablet (25 mg total) by mouth 3 (three) times daily as needed for dizziness. 30 tablet 0  . Melatonin 10 MG TABS Take 1 tablet by mouth at bedtime.    . Multiple Vitamin (MULTIVITAMIN) tablet Take 1 tablet by mouth daily.    . Probiotic Product (PROBIOTIC-10 PO) Take 1 tablet by mouth 2 (two) times daily.     .Marland Kitchen  spironolactone (ALDACTONE) 100 MG tablet Take 100 mg by mouth daily.  4  . SPRINTEC 28 0.25-35 MG-MCG tablet Take 1 tablet by mouth daily.  4  . SUMAtriptan (IMITREX) 50 MG tablet Take 1 tablet (50 mg total) by mouth once for 1 dose. May repeat in 2 hours if headache persists or recurs. 10 tablet 0  . temazepam (RESTORIL) 15 MG capsule TAKE 1 CAPSULE (15 MG TOTAL) BY MOUTH AT BEDTIME AS NEEDED FOR SLEEP. 30 capsule 0  . venlafaxine XR (EFFEXOR-XR) 150 MG 24 hr capsule TAKE ONE CAPSULE BY MOUTH EVERY MORNING WITH BREAKFAST 90 capsule 1  . vitamin B-12 (CYANOCOBALAMIN) 1000 MCG tablet  Take 1,000 mcg by mouth daily.     No current facility-administered medications on file prior to visit.     No Known Allergies  Family History  Problem Relation Age of Onset  . Hypertension Mother   . Depression Mother   . Hypertension Father   . Cancer Father        kidney  . Depression Father   . Cancer Maternal Uncle        stomach  . Hypertension Maternal Grandmother   . Cancer Maternal Grandmother        kidney, bone  . Hypertension Maternal Grandfather   . Cancer Maternal Grandfather        lung, bone  . Stroke Maternal Grandfather   . Hypertension Paternal Grandmother   . Hypertension Paternal Grandfather   . Stroke Paternal Grandfather     Social History   Socioeconomic History  . Marital status: Married    Spouse name: Not on file  . Number of children: Not on file  . Years of education: Not on file  . Highest education level: Not on file  Occupational History  . Occupation: Nurse    Employer: [employer], [employer]  Social Needs  . Financial resource strain: Not on file  . Food insecurity    Worry: Not on file    Inability: Not on file  . Transportation needs    Medical: Not on file    Non-medical: Not on file  Tobacco Use  . Smoking status: Never Smoker  . Smokeless tobacco: Never Used  Substance and Sexual Activity  . Alcohol use: No  . Drug use: No  . Sexual activity: Yes    Birth control/protection: Surgical  Lifestyle  . Physical activity    Days per week: Not on file    Minutes per session: Not on file  . Stress: Not on file  Relationships  . Social Herbalist on phone: Not on file    Gets together: Not on file    Attends religious service: Not on file    Active member of club or organization: Not on file    Attends meetings of clubs or organizations: Not on file    Relationship status: Not on file  . Intimate partner violence    Fear of current or ex partner: Not on file    Emotionally abused: Not on file    Physically  abused: Not on file    Forced sexual activity: Not on file  Other Topics Concern  . Not on file  Social History Narrative  . Not on file    Review of Systems  Constitutional: Negative for fever and weight loss.  HENT: Negative for ear discharge, ear pain, hearing loss and tinnitus.   Eyes: Negative for blurred vision, double vision, photophobia and pain.  Last seen around 1 year ago  Respiratory: Negative for cough and shortness of breath.   Cardiovascular: Negative for chest pain and palpitations.  Gastrointestinal: Positive for constipation, diarrhea and nausea. Negative for abdominal pain, blood in stool, heartburn, melena and vomiting.       Diet related, related to IBS  Genitourinary: Negative for dysuria, flank pain, frequency, hematuria and urgency.  Musculoskeletal: Negative for falls.  Neurological: Negative for dizziness, loss of consciousness and headaches.  Endo/Heme/Allergies: Negative for environmental allergies.  Psychiatric/Behavioral: Negative for depression, hallucinations, substance abuse and suicidal ideas. The patient is not nervous/anxious and does not have insomnia.     There were no vitals taken for this visit.  Physical Exam Vitals signs reviewed.  HENT:     Head: Normocephalic and atraumatic.     Right Ear: Tympanic membrane, ear canal and external ear normal.     Left Ear: Tympanic membrane, ear canal and external ear normal.     Nose: Nose normal. No mucosal edema.     Mouth/Throat:     Pharynx: Uvula midline. No oropharyngeal exudate or posterior oropharyngeal erythema.  Eyes:     Conjunctiva/sclera: Conjunctivae normal.     Pupils: Pupils are equal, round, and reactive to light.  Neck:     Musculoskeletal: Neck supple.     Thyroid: No thyromegaly.  Cardiovascular:     Rate and Rhythm: Normal rate and regular rhythm.     Heart sounds: Normal heart sounds.  Pulmonary:     Effort: Pulmonary effort is normal. No respiratory distress.      Breath sounds: Normal breath sounds. No wheezing or rales.  Abdominal:     General: Bowel sounds are normal. There is no distension.     Palpations: Abdomen is soft. There is no mass.     Tenderness: There is no abdominal tenderness. There is no guarding or rebound.  Lymphadenopathy:     Cervical: No cervical adenopathy.  Skin:    General: Skin is warm and dry.     Findings: No rash.  Neurological:     Mental Status: She is alert and oriented to person, place, and time.     Cranial Nerves: No cranial nerve deficit.     Recent Results (from the past 2160 hour(s))  POCT urinalysis dipstick     Status: Abnormal   Collection Time: 05/24/18  2:45 PM  Result Value Ref Range   Color, UA yellow    Clarity, UA cloudy    Glucose, UA Negative Negative   Bilirubin, UA negative    Ketones, UA negative    Spec Grav, UA 1.020 1.010 - 1.025   Blood, UA 2+    pH, UA 6.0 5.0 - 8.0   Protein, UA Negative Negative   Urobilinogen, UA 0.2 0.2 or 1.0 E.U./dL   Nitrite, UA negative    Leukocytes, UA Small (1+) (A) Negative   Appearance     Odor    Urine Culture     Status: None   Collection Time: 05/24/18  2:49 PM   Specimen: Urine  Result Value Ref Range   MICRO NUMBER: [id number]    SPECIMEN QUALITY: Adequate    Sample Source NOT GIVEN    STATUS: FINAL    Result:      Three or more organisms present, each greater than 10,000 CFU/mL. May represent normal flora contamination from external genitalia. No further testing is required.  CBC w/Diff     Status: None   Collection Time:  05/24/18  3:15 PM  Result Value Ref Range   WBC 9.7 4.0 - 10.5 K/uL   RBC 4.53 3.87 - 5.11 Mil/uL   Hemoglobin 14.4 12.0 - 15.0 g/dL   HCT 16.142.5 09.636.0 - 04.546.0 %   MCV 93.9 78.0 - 100.0 fl   MCHC 33.8 30.0 - 36.0 g/dL   RDW 40.913.7 81.111.5 - 91.415.5 %   Platelets 383.0 150.0 - 400.0 K/uL   Neutrophils Relative % 66.0 43.0 - 77.0 %   Lymphocytes Relative 26.7 12.0 - 46.0 %   Monocytes Relative 5.2 3.0 - 12.0 %   Eosinophils  Relative 1.3 0.0 - 5.0 %   Basophils Relative 0.8 0.0 - 3.0 %   Neutro Abs 6.4 1.4 - 7.7 K/uL   Lymphs Abs 2.6 0.7 - 4.0 K/uL   Monocytes Absolute 0.5 0.1 - 1.0 K/uL   Eosinophils Absolute 0.1 0.0 - 0.7 K/uL   Basophils Absolute 0.1 0.0 - 0.1 K/uL  Basic metabolic panel     Status: Abnormal   Collection Time: 05/24/18  3:15 PM  Result Value Ref Range   Sodium 137 135 - 145 mEq/L   Potassium 4.2 3.5 - 5.1 mEq/L   Chloride 103 96 - 112 mEq/L   CO2 24 19 - 32 mEq/L   Glucose, Bld 105 (H) 70 - 99 mg/dL   BUN 10 6 - 23 mg/dL   Creatinine, Ser 7.820.82 0.40 - 1.20 mg/dL   Calcium 9.2 8.4 - 95.610.5 mg/dL   GFR 21.3079.35 >86.57>60.00 mL/min    Assessment/Plan: 1. Visit for preventive health examination Depression screen negative. Health Maintenance reviewed.. Preventive schedule discussed and handout given in AVS. Labs obtained by GYN within normal limits. A1C at 5.5. Will abstract into chart.  2. Anxiety and depression Stable. Continue current regimen.   3. Insomnia, unspecified type Stable. Continue current regimen.  4. Chronic migraine Stable. Continue current regimen.  5. Tonsillar hypertrophy Suspect 2/2 chronic rhinitis. Will start OTC Claritin daily. Referral to ENT placed.    Piedad ClimesWilliam Cody Alynah Schone, PA-C

## 2018-07-21 NOTE — Patient Instructions (Signed)
Your lab results looked good!  Start an over-the-counter Claritin once daily to help with allergic inflammation. Keep hydrated. I am setting you up with ENT for further assessment.   Keep working on increasing those fruits and veggies! Try to work to a goal of 150 minutes a week of moderate-intensity aerobic exercise.   .   Preventive Care 18-39 Years, Female Preventive care refers to lifestyle choices and visits with your health care provider that can promote health and wellness. What does preventive care include?   A yearly physical exam. This is also called an annual well check.  Dental exams once or twice a year.  Routine eye exams. Ask your health care provider how often you should have your eyes checked.  Personal lifestyle choices, including: ? Daily care of your teeth and gums. ? Regular physical activity. ? Eating a healthy diet. ? Avoiding tobacco and drug use. ? Limiting alcohol use. ? Practicing safe sex. ? Taking vitamin and mineral supplements as recommended by your health care provider. What happens during an annual well check? The services and screenings done by your health care provider during your annual well check will depend on your age, overall health, lifestyle risk factors, and family history of disease. Counseling Your health care provider may ask you questions about your:  Alcohol use.  Tobacco use.  Drug use.  Emotional well-being.  Home and relationship well-being.  Sexual activity.  Eating habits.  Work and work Statistician.  Method of birth control.  Menstrual cycle.  Pregnancy history. Screening You may have the following tests or measurements:  Height, weight, and BMI.  Diabetes screening. This is done by checking your blood sugar (glucose) after you have not eaten for a while (fasting).  Blood pressure.  Lipid and cholesterol levels. These may be checked every 5 years starting at age 43.  Skin check.  Hepatitis C  blood test.  Hepatitis B blood test.  Sexually transmitted disease (STD) testing.  BRCA-related cancer screening. This may be done if you have a family history of breast, ovarian, tubal, or peritoneal cancers.  Pelvic exam and Pap test. This may be done every 3 years starting at age 34. Starting at age 59, this may be done every 5 years if you have a Pap test in combination with an HPV test. Discuss your test results, treatment options, and if necessary, the need for more tests with your health care provider. Vaccines Your health care provider may recommend certain vaccines, such as:  Influenza vaccine. This is recommended every year.  Tetanus, diphtheria, and acellular pertussis (Tdap, Td) vaccine. You may need a Td booster every 10 years.  Varicella vaccine. You may need this if you have not been vaccinated.  HPV vaccine. If you are 79 or younger, you may need three doses over 6 months.  Measles, mumps, and rubella (MMR) vaccine. You may need at least one dose of MMR. You may also need a second dose.  Pneumococcal 13-valent conjugate (PCV13) vaccine. You may need this if you have certain conditions and were not previously vaccinated.  Pneumococcal polysaccharide (PPSV23) vaccine. You may need one or two doses if you smoke cigarettes or if you have certain conditions.  Meningococcal vaccine. One dose is recommended if you are age 27-21 years and a first-year college student living in a residence hall, or if you have one of several medical conditions. You may also need additional booster doses.  Hepatitis A vaccine. You may need this if you have certain  conditions or if you travel or work in places where you may be exposed to hepatitis A.  Hepatitis B vaccine. You may need this if you have certain conditions or if you travel or work in places where you may be exposed to hepatitis B.  Haemophilus influenzae type b (Hib) vaccine. You may need this if you have certain risk factors.  Talk to your health care provider about which screenings and vaccines you need and how often you need them. This information is not intended to replace advice given to you by your health care provider. Make sure you discuss any questions you have with your health care provider. Document Released: 03/10/2001 Document Revised: 08/25/2016 Document Reviewed: 11/13/2014 Elsevier Interactive Patient Education  2019 [employer].

## 2018-08-05 ENCOUNTER — Encounter: Payer: Self-pay | Admitting: Physician Assistant

## 2018-08-05 ENCOUNTER — Other Ambulatory Visit: Payer: Self-pay | Admitting: Physician Assistant

## 2018-08-05 NOTE — Telephone Encounter (Signed)
Last OV 07/21/18 restoril last filled 07/05/18 #30 with 0

## 2018-08-11 ENCOUNTER — Encounter: Payer: Self-pay | Admitting: Physician Assistant

## 2018-08-11 NOTE — Telephone Encounter (Signed)
Please check on status of referral and reach out to patient. Thank you.

## 2018-08-23 DIAGNOSIS — J351 Hypertrophy of tonsils: Secondary | ICD-10-CM | POA: Diagnosis not present

## 2018-09-13 ENCOUNTER — Telehealth: Payer: BC Managed Care – PPO | Admitting: Nurse Practitioner

## 2018-09-13 DIAGNOSIS — M542 Cervicalgia: Secondary | ICD-10-CM | POA: Diagnosis not present

## 2018-09-13 MED ORDER — NAPROXEN 500 MG PO TABS: 500.0000 mg | ORAL_TABLET | Freq: Two times a day (BID) | ORAL | 1 refills | Status: DC

## 2018-09-13 MED ORDER — BACLOFEN 10 MG PO TABS: 10.0000 mg | ORAL_TABLET | Freq: Three times a day (TID) | ORAL | 0 refills | Status: DC

## 2018-09-13 NOTE — Progress Notes (Signed)
We are sorry that you are not feeling well.  Here is how we plan to help!  Based on what you have shared with me it looks like you mostly have acute neck pain.  Acute back pain is defined as musculoskeletal pain that can resolve in 1-3 weeks with conservative treatment.  I have prescribed Naprosyn 500 mg twice a day non-steroid anti-inflammatory (NSAID) as well as Baclofen 10 mg every eight hours as needed which is a muscle relaxer  Some patients experience stomach irritation or in increased heartburn with anti-inflammatory drugs.  Please keep in mind that muscle relaxer's can cause fatigue and should not be taken while at work or driving.  Back pain is very common.  The pain often gets better over time.  The cause of back pain is usually not dangerous.  Most people can learn to manage their back pain on their own.  Home Care  Stay active.  Start with short walks on flat ground if you can.  Try to walk farther each day.  Do not sit, drive or stand in one place for more than 30 minutes.  Do not stay in bed.  Do not avoid exercise or work.  Activity can help your back heal faster.  Be careful when you bend or lift an object.  Bend at your knees, keep the object close to you, and do not twist.  Sleep on a firm mattress.  Lie on your side, and bend your knees.  If you lie on your back, put a pillow under your knees.  Only take medicines as told by your doctor.  Put ice on the injured area.  Put ice in a plastic bag  Place a towel between your skin and the bag  Leave the ice on for 15-20 minutes, 3-4 times a day for the first 2-3 days. 210 After that, you can switch between ice and heat packs.  Ask your doctor about back exercises or massage.  Avoid feeling anxious or stressed.  Find good ways to deal with stress, such as exercise.  Get Help Right Way If:  Your pain does not go away with rest or medicine.  Your pain does not go away in 1 week.  You have new problems.  You do not  feel well.  The pain spreads into your legs.  You cannot control when you poop (bowel movement) or pee (urinate)  You feel sick to your stomach (nauseous) or throw up (vomit)  You have belly (abdominal) pain.  You feel like you may pass out (faint).  If you develop a fever.  Make Sure you:  Understand these instructions.  Will watch your condition  Will get help right away if you are not doing well or get worse.  Your e-visit answers were reviewed by a board certified advanced clinical practitioner to complete your personal care plan.  Depending on the condition, your plan could have included both over the counter or prescription medications.  If there is a problem please reply  once you have received a response from your provider.  Your safety is important to [place].  If you have drug allergies check your prescription carefully.    You can use MyChart to ask questions about today's visit, request a non-urgent call back, or ask for a work or school excuse for 24 hours related to this e-Visit. If it has been greater than 24 hours you will need to follow up with your provider, or enter a new e-Visit to address  those concerns.  You will get an e-mail in the next two days asking about your experience.  I hope that your e-visit has been valuable and will speed your recovery. Thank you for using e-visits.  5-10 minutes spent reviewing and documenting in chart.

## 2018-09-29 ENCOUNTER — Other Ambulatory Visit: Payer: Self-pay | Admitting: Physician Assistant

## 2018-10-10 ENCOUNTER — Other Ambulatory Visit: Payer: Self-pay

## 2018-10-10 ENCOUNTER — Ambulatory Visit (INDEPENDENT_AMBULATORY_CARE_PROVIDER_SITE_OTHER): Payer: BC Managed Care – PPO | Admitting: *Deleted

## 2018-10-10 DIAGNOSIS — Z23 Encounter for immunization: Secondary | ICD-10-CM

## 2018-10-13 DIAGNOSIS — Z20828 Contact with and (suspected) exposure to other viral communicable diseases: Secondary | ICD-10-CM | POA: Diagnosis not present

## 2018-10-13 DIAGNOSIS — J209 Acute bronchitis, unspecified: Secondary | ICD-10-CM | POA: Diagnosis not present

## 2018-10-20 ENCOUNTER — Other Ambulatory Visit: Payer: Self-pay | Admitting: Physician Assistant

## 2018-10-20 NOTE — Telephone Encounter (Signed)
Last refill: 7.10.20 #30, 1  Last OV: 6.25.20

## 2018-10-31 ENCOUNTER — Other Ambulatory Visit: Payer: Self-pay | Admitting: Nurse Practitioner

## 2018-10-31 DIAGNOSIS — M542 Cervicalgia: Secondary | ICD-10-CM

## 2018-11-01 ENCOUNTER — Other Ambulatory Visit (INDEPENDENT_AMBULATORY_CARE_PROVIDER_SITE_OTHER): Payer: Self-pay | Admitting: Family Medicine

## 2018-11-01 ENCOUNTER — Encounter: Payer: Self-pay | Admitting: Family Medicine

## 2018-11-01 ENCOUNTER — Other Ambulatory Visit: Payer: Self-pay

## 2018-11-01 ENCOUNTER — Encounter (INDEPENDENT_AMBULATORY_CARE_PROVIDER_SITE_OTHER): Payer: Self-pay | Admitting: Family Medicine

## 2018-11-01 ENCOUNTER — Ambulatory Visit (INDEPENDENT_AMBULATORY_CARE_PROVIDER_SITE_OTHER): Payer: BC Managed Care – PPO | Admitting: Family Medicine

## 2018-11-01 VITALS — BP 127/81 | HR 94 | Temp 98.4°F | Ht 65.0 in | Wt 282.0 lb

## 2018-11-01 DIAGNOSIS — Z0289 Encounter for other administrative examinations: Secondary | ICD-10-CM

## 2018-11-01 DIAGNOSIS — Z1331 Encounter for screening for depression: Secondary | ICD-10-CM | POA: Diagnosis not present

## 2018-11-01 DIAGNOSIS — R5383 Other fatigue: Secondary | ICD-10-CM

## 2018-11-01 DIAGNOSIS — R739 Hyperglycemia, unspecified: Secondary | ICD-10-CM | POA: Diagnosis not present

## 2018-11-01 DIAGNOSIS — R5382 Chronic fatigue, unspecified: Secondary | ICD-10-CM | POA: Diagnosis not present

## 2018-11-01 DIAGNOSIS — G932 Benign intracranial hypertension: Secondary | ICD-10-CM

## 2018-11-01 DIAGNOSIS — Z9189 Other specified personal risk factors, not elsewhere classified: Secondary | ICD-10-CM | POA: Diagnosis not present

## 2018-11-01 DIAGNOSIS — Z6841 Body Mass Index (BMI) 40.0 and over, adult: Secondary | ICD-10-CM

## 2018-11-01 DIAGNOSIS — R0602 Shortness of breath: Secondary | ICD-10-CM | POA: Diagnosis not present

## 2018-11-02 LAB — COMPREHENSIVE METABOLIC PANEL
ALT: 20 IU/L (ref 0–32)
AST: 17 IU/L (ref 0–40)
Albumin/Globulin Ratio: 1.4 (ref 1.2–2.2)
Albumin: 3.8 g/dL (ref 3.8–4.8)
Alkaline Phosphatase: 54 IU/L (ref 39–117)
BUN/Creatinine Ratio: 20 (ref 9–23)
BUN: 15 mg/dL (ref 6–20)
Bilirubin Total: <0.2 mg/dL (ref 0.0–1.2)
CO2: 22 mmol/L (ref 20–29)
Calcium: 9.1 mg/dL (ref 8.7–10.2)
Chloride: 102 mmol/L (ref 96–106)
Creatinine, Ser: 0.76 mg/dL (ref 0.57–1.00)
GFR calc Af Amer: 118 mL/min/{1.73_m2} (ref >59)
GFR calc non Af Amer: 102 mL/min/{1.73_m2} (ref >59)
Globulin, Total: 2.8 g/dL (ref 1.5–4.5)
Glucose: 93 mg/dL (ref 65–99)
Potassium: 4.2 mmol/L (ref 3.5–5.2)
Sodium: 137 mmol/L (ref 134–144)
Total Protein: 6.6 g/dL (ref 6.0–8.5)

## 2018-11-02 LAB — INSULIN, RANDOM: INSULIN: 23.9 u[IU]/mL (ref 2.6–24.9)

## 2018-11-02 LAB — LIPID PANEL WITH LDL/HDL RATIO
Cholesterol, Total: 128 mg/dL (ref 100–199)
HDL: 58 mg/dL (ref >39)
LDL Chol Calc (NIH): 53 mg/dL (ref 0–99)
LDL/HDL Ratio: 0.9 ratio (ref 0.0–3.2)
Triglycerides: 86 mg/dL (ref 0–149)
VLDL Cholesterol Cal: 17 mg/dL (ref 5–40)

## 2018-11-02 LAB — TSH: TSH: 1.5 u[IU]/mL (ref 0.450–4.500)

## 2018-11-02 LAB — CBC WITH DIFFERENTIAL/PLATELET
Basophils Absolute: 0.1 10*3/uL (ref 0.0–0.2)
Basos: 1 %
EOS (ABSOLUTE): 0.1 10*3/uL (ref 0.0–0.4)
Eos: 1 %
Hematocrit: 41.6 % (ref 34.0–46.6)
Hemoglobin: 13.9 g/dL (ref 11.1–15.9)
Immature Grans (Abs): 0 10*3/uL (ref 0.0–0.1)
Immature Granulocytes: 0 %
Lymphocytes Absolute: 2.5 10*3/uL (ref 0.7–3.1)
Lymphs: 31 %
MCH: 31.4 pg (ref 26.6–33.0)
MCHC: 33.4 g/dL (ref 31.5–35.7)
MCV: 94 fL (ref 79–97)
Monocytes Absolute: 0.3 10*3/uL (ref 0.1–0.9)
Monocytes: 4 %
Neutrophils Absolute: 5.1 10*3/uL (ref 1.4–7.0)
Neutrophils: 63 %
Platelets: 367 10*3/uL (ref 150–450)
RBC: 4.43 x10E6/uL (ref 3.77–5.28)
RDW: 13 % (ref 11.7–15.4)
WBC: 8.1 10*3/uL (ref 3.4–10.8)

## 2018-11-02 LAB — T4, FREE: Free T4: 0.94 ng/dL (ref 0.82–1.77)

## 2018-11-02 LAB — VITAMIN D 25 HYDROXY (VIT D DEFICIENCY, FRACTURES): Vit D, 25-Hydroxy: 78.6 ng/mL (ref 30.0–100.0)

## 2018-11-02 LAB — VITAMIN B12: Vitamin B-12: 462 pg/mL (ref 232–1245)

## 2018-11-02 LAB — FOLATE: Folate: >20 ng/mL (ref >3.0)

## 2018-11-02 LAB — T3: T3, Total: 174 ng/dL (ref 71–180)

## 2018-11-02 LAB — HEMOGLOBIN A1C
Est. average glucose Bld gHb Est-mCnc: 111 mg/dL
Hgb A1c MFr Bld: 5.5 % (ref 4.8–5.6)

## 2018-11-03 NOTE — Progress Notes (Signed)
Office: [phone]  /  Fax: [phone]   Dear Dr. Cherly Hensen,   Thank you for referring [name] to our clinic. The following note includes my evaluation and treatment recommendations.  HPI:   Chief Complaint: OBESITY    [name] has been referred by Maxie Better, MD for consultation regarding her obesity and obesity related comorbidities.    [name] (MR# [id number]) is a 33 y.o. female who presents on 11/01/2018 for obesity evaluation and treatment. Current BMI is Body mass index is 46.93 kg/m. [name] has been struggling with her weight for many years and has been unsuccessful in either losing weight, maintaining weight loss, or reaching her healthy weight goal.     [name] attended our information session and states she is currently in the action stage of change and ready to dedicate time achieving and maintaining a healthier weight. [name] is interested in becoming our patient and working on intensive lifestyle modifications including (but not limited to) diet, exercise and weight loss.    [name] states her family eats meals together she thinks her family will eat healthier with  her her desired weight loss is 82 lbs she has been heavy most of  her life she started gaining weight during nursing school her heaviest weight ever was 285 lbs she has significant food cravings issues  she snacks frequently in the evenings she skips meals frequently she is frequently drinking liquids with calories she frequently makes poor food choices she frequently eats larger portions than normal  she has binge eating behaviors she struggles with emotional eating    [name] feels her energy is lower than it should be. This has worsened with weight gain and has not worsened recently. [name] admits to daytime somnolence and  admits to waking up still tired. Patient is at risk for obstructive sleep apnea. Patent has a history of symptoms of daytime  fatigue. Patient generally gets 6 hours of sleep per night, and states they generally have generally restful sleep. Snoring is present. Apneic episodes are not present. Epworth Sleepiness Score is 8.  Dyspnea on exertion [name] notes increasing shortness of breath with exercising and seems to be worsening over time with weight gain. She notes getting out of breath sooner with activity than she used to. This has not gotten worse recently. Madison denies orthopnea.  Pseudotumor Cerebri [name] is on spironolactone for this and polycystic ovarian syndrome. She denies any symptoms and pressures have resolved after the birth of her son. She has yearly eye exams.  At risk for cardiovascular disease [name] is at a higher than average risk for cardiovascular disease due to obesity and pseudotumor cerebri. She currently denies any chest pain.  Hyperglycemia [name] has a history of some elevated blood glucose readings without a diagnosis of diabetes in Epic. No recent Hgb A1c.   Depression Screen [name]'s Food and Mood (modified PHQ-9) score was  Depression screen PHQ 2/9 11/01/2018  Decreased Interest 2  Down, Depressed, Hopeless 2  PHQ - 2 Score 4  Altered sleeping 1  Tired, decreased energy 3  Change in appetite 2  Feeling bad or failure about yourself  1  Trouble concentrating 2  Moving slowly or fidgety/restless 0  Suicidal thoughts 0  PHQ-9 Score 13  Difficult doing work/chores Not difficult at all  Some encounter information is confidential and restricted. Go to Review Flowsheets activity to see all data.    ASSESSMENT AND PLAN:  Other fatigue - Plan: EKG 12-Lead  Shortness  of breath on exertion  Pseudotumor cerebri  Hyperglycemia  Depression screening  At risk for heart disease  Class 3 severe obesity with serious comorbidity and body mass index (BMI) of 45.0 to 49.9 in adult, unspecified obesity type ([place])  PLAN:  Fatigue [name] was informed that her  fatigue may be related to obesity, depression or many other causes. Labs will be ordered, and in the meanwhile [name] has agreed to work on diet, exercise and weight loss to help with fatigue. Proper sleep hygiene was discussed including the need for 7-8 hours of quality sleep each night. A sleep study was not ordered based on symptoms and Epworth score.  Dyspnea on exertion [name]'s shortness of breath appears to be obesity related and exercise induced. She has agreed to work on weight loss and gradually increase exercise to treat her exercise induced shortness of breath. If [name] follows our instructions and loses weight without improvement of her shortness of breath, we will plan to refer to pulmonology. We will monitor this condition regularly. Omega agrees to this plan.  Pseudotumor Cerebri [name] agrees to continue spironolactone and will work on weight loss. [name] agrees to follow up with our clinic in 2 weeks.  Cardiovascular risk counseling [name] was given extended (15 minutes) coronary artery disease prevention counseling today. She is 33 y.o. female and has risk factors for heart disease including obesity and pseudotumor cerebri. We discussed intensive lifestyle modifications today with an emphasis on specific weight loss instructions and strategies. Pt was also informed of the importance of increasing exercise and decreasing saturated fats to help prevent heart disease.  Hyperglycemia Fasting labs will be obtained today and results with be discussed with [name] in 2 weeks at her follow up visit. In the meanwhile [name] was started on a lower simple carbohydrate diet and will work on weight loss efforts.  Depression Screen [name] had a moderately positive depression screening. Depression is commonly associated with obesity and often results in emotional eating behaviors. We will monitor this closely and work on CBT to help improve the non-hunger eating patterns.  Referral to Psychology may be required if no improvement is seen as she continues in our clinic.  Obesity [name] is currently in the action stage of change and her goal is to continue with weight loss efforts. I recommend [name] begin the structured treatment plan as follows:  She has agreed to follow the Category 3 plan Searra has been instructed to eventually work up to a goal of 150 minutes of combined cardio and strengthening exercise per week for weight loss and overall health benefits. We discussed the following Behavioral Modification Strategies today: increasing lean protein intake, decreasing simple carbohydrates  and work on meal planning and easy cooking plans   She was informed of the importance of frequent follow up visits to maximize her success with intensive lifestyle modifications for her multiple health conditions. She was informed we would discuss her lab results at her next visit unless there is a critical issue that needs to be addressed sooner. Barney agreed to keep her next visit at the agreed upon time to discuss these results.  ALLERGIES: No Known Allergies  MEDICATIONS: Current Outpatient Medications on File Prior to Visit  Medication Sig Dispense Refill   amitriptyline (ELAVIL) 25 MG tablet TAKE 2 TABLETS BY MOUTH AT BEDTIME 180 tablet 1   Biotin 5000 MCG CAPS Take 1 capsule by mouth daily.     butalbital-acetaminophen-caffeine (FIORICET, ESGIC) 50-325-40 MG per tablet Take 1 tablet by  mouth 2 (two) times daily as needed for headache.     cetirizine (ZYRTEC) 10 MG tablet Take 10 mg by mouth daily.     cimetidine (TAGAMET) 200 MG tablet Take 200 mg by mouth 2 (two) times daily.     dicyclomine (BENTYL) 10 MG capsule Take 10 mg by mouth as needed for spasms.     fluticasone (FLONASE) 50 MCG/ACT nasal spray Place 2 sprays into both nostrils daily. 16 g 6   Melatonin 10 MG TABS Take 1 tablet by mouth at bedtime.     Multiple Vitamin (MULTIVITAMIN)  tablet Take 1 tablet by mouth daily.     Probiotic Product (PROBIOTIC-10 PO) Take 1 tablet by mouth 2 (two) times daily.      spironolactone (ALDACTONE) 100 MG tablet Take 100 mg by mouth daily.  4   SPRINTEC 28 0.25-35 MG-MCG tablet Take 1 tablet by mouth daily.  4   SUMAtriptan (IMITREX) 50 MG tablet Take 1 tablet (50 mg total) by mouth once for 1 dose. May repeat in 2 hours if headache persists or recurs. 10 tablet 0   temazepam (RESTORIL) 15 MG capsule TAKE 1 CAPSULE BY MOUTH AT BEDTIME AS NEEDED FOR SLEEP. 30 capsule 1   venlafaxine XR (EFFEXOR-XR) 150 MG 24 hr capsule TAKE ONE CAPSULE BY MOUTH EVERY MORNING WITH BREAKFAST 90 capsule 1   No current facility-administered medications on file prior to visit.     PAST MEDICAL HISTORY: Past Medical History:  Diagnosis Date   Allergy    Anxiety    Back pain    Constipation    Depression    Dyspnea    Gallbladder problem    GERD (gastroesophageal reflux disease)    IBS (irritable bowel syndrome)    Interstitial cystitis    Lactose intolerance    Migraine    Overactive bladder    Palpitations    PCOS (polycystic ovarian syndrome)    Pelvic pain    Pseudotumor cerebri    Pseudotumor cerebri    SUI (stress urinary incontinence, female)    Urinary incontinence     PAST SURGICAL HISTORY: Past Surgical History:  Procedure Laterality Date   CESAREAN SECTION  2009  &  2011   2011 W/ BILATERAL TUBAL LIGATION   CHOLECYSTECTOMY  2010   CYSTO WITH HYDRODISTENSION  03/01/2012   Procedure: CYSTOSCOPY/HYDRODISTENSION;  Surgeon: Martina Sinner, MD;  Location: [facility];  Service: Urology;  Laterality: N/A;  INSTILLATION of marcaine and pyridium.   PUBOVAGINAL SLING N/A 02/14/2013   Procedure: PUBO-VAGINAL SLING AND HYDRODISTENSION;  Surgeon: Martina Sinner, MD;  Location: [facility];  Service: Urology;  Laterality: N/A;   TRANSTHORACIC ECHOCARDIOGRAM  06/2009   TUBAL  LIGATION      SOCIAL HISTORY: Social History   Tobacco Use   Smoking status: Never Smoker   Smokeless tobacco: Never Used  Substance Use Topics   Alcohol use: No   Drug use: No    FAMILY HISTORY: Family History  Problem Relation Age of Onset   Hypertension Mother    Depression Mother    Diabetes Mother    Anxiety disorder Mother    Sleep apnea Mother    Obesity Mother    Hypertension Father    Cancer Father        kidney   Depression Father    Hyperlipidemia Father    Kidney cancer Father    Alcoholism Father    Obesity Father  Cancer Maternal Uncle        stomach   Hypertension Maternal Grandmother    Cancer Maternal Grandmother        kidney, bone   Hypertension Maternal Grandfather    Cancer Maternal Grandfather        lung, bone   Stroke Maternal Grandfather    Hypertension Paternal Grandmother    Hypertension Paternal Grandfather    Stroke Paternal Grandfather     ROS: Review of Systems  Constitutional: Positive for malaise/fatigue. Negative for weight loss.  Eyes:       + Wear glasses or contacts  Respiratory: Positive for shortness of breath (with exertion).   Cardiovascular: Positive for palpitations. Negative for chest pain and orthopnea.  Musculoskeletal: Positive for back pain.    PHYSICAL EXAM: Blood pressure 127/81, pulse 94, temperature 98.4 F (36.9 C), temperature source Oral, height 5\' 5"  (1.651 m), weight 282 lb (127.9 kg), last menstrual period 10/17/2018, SpO2 98 %. Body mass index is 46.93 kg/m. Physical Exam Vitals signs reviewed.  Constitutional:      Appearance: Normal appearance. She is obese.  HENT:     Head: Normocephalic and atraumatic.     Nose: Nose normal.  Eyes:     General: No scleral icterus.    Extraocular Movements: Extraocular movements intact.  Neck:     Musculoskeletal: Normal range of motion and neck supple.     Comments: No thyromegaly present Cardiovascular:     Rate and  Rhythm: Regular rhythm. Tachycardia present.     Pulses: Normal pulses.     Heart sounds: Normal heart sounds.  Pulmonary:     Effort: Pulmonary effort is normal. No respiratory distress.     Breath sounds: Normal breath sounds.  Abdominal:     Palpations: Abdomen is soft.     Tenderness: There is no abdominal tenderness.     Comments: + Obesity  Musculoskeletal: Normal range of motion.     Right lower leg: No edema.     Left lower leg: No edema.  Skin:    General: Skin is warm and dry.  Neurological:     Mental Status: She is alert and oriented to person, place, and time.     Coordination: Coordination normal.  Psychiatric:        Mood and Affect: Mood normal.        Behavior: Behavior normal.     RECENT LABS AND TESTS: BMET    Component Value Date/Time   NA 140 07/12/2018   K 4.4 07/12/2018   CL 103 05/24/2018 1515   CO2 24 05/24/2018 1515   GLUCOSE 105 (H) 05/24/2018 1515   BUN 13 07/12/2018   CREATININE 0.7 07/12/2018   CREATININE 0.82 05/24/2018 1515   CALCIUM 9.2 05/24/2018 1515   GFRNONAA >90 02/23/2014 2234   GFRAA >90 02/23/2014 2234   Lab Results  Component Value Date   HGBA1C 5.5 07/06/2017   No results found for: INSULIN CBC    Component Value Date/Time   WBC 9.5 07/12/2018   WBC 9.7 05/24/2018 1515   RBC 4.53 05/24/2018 1515   HGB 13.5 07/12/2018   HCT 41 07/12/2018   PLT 359 07/12/2018   MCV 93.9 05/24/2018 1515   MCH 32.5 04/23/2014 1754   MCHC 33.8 05/24/2018 1515   RDW 13.7 05/24/2018 1515   LYMPHSABS 2.6 05/24/2018 1515   MONOABS 0.5 05/24/2018 1515   EOSABS 0.1 05/24/2018 1515   BASOSABS 0.1 05/24/2018 1515   Iron/TIBC/Ferritin/ %Sat No  results found for: IRON, TIBC, FERRITIN, IRONPCTSAT Lipid Panel     Component Value Date/Time   CHOL 125 07/12/2018   TRIG 83 07/12/2018   HDL 56 07/12/2018   CHOLHDL 2 05/05/2016 1019   VLDL 26.2 05/05/2016 1019   LDLCALC 52 07/12/2018   Hepatic Function Panel     Component Value  Date/Time   PROT 6.6 05/05/2016 1019   ALBUMIN 3.7 05/05/2016 1019   AST 15 07/12/2018   ALT 24 07/12/2018   ALKPHOS 54 07/12/2018   BILITOT 0.3 05/05/2016 1019      Component Value Date/Time   TSH 1.33 07/12/2018   TSH 3.24 07/19/2017 1013   TSH 4.08 07/06/2017   TSH 1.25 05/05/2016 1019    ECG  shows NSR with a rate of 104 BPM INDIRECT CALORIMETER done today shows a VO2 of 388 and a REE of 2701.  Her calculated basal metabolic rate is 1610 thus her basal metabolic rate is better than expected.       OBESITY BEHAVIORAL INTERVENTION VISIT  Today's visit was # 1   Starting weight: 282 lbs Starting date: 11/01/2018 Today's weight : 282 lbs Today's date: 11/01/2018 Total lbs lost to date: 0    ASK: We discussed the diagnosis of obesity with [name] today and [name] agreed to give [place] permission to discuss obesity behavioral modification therapy today.  ASSESS: Esparanza has the diagnosis of obesity and her BMI today is 46.93 Pamla is in the action stage of change   ADVISE: Lulani was educated on the multiple health risks of obesity as well as the benefit of weight loss to improve her health. She was advised of the need for long term treatment and the importance of lifestyle modifications to improve her current health and to decrease her risk of future health problems.  AGREE: Multiple dietary modification options and treatment options were discussed and  Lolly agreed to follow the recommendations documented in the above note.  ARRANGE: Marylouise was educated on the importance of frequent visits to treat obesity as outlined per CMS and USPSTF guidelines and agreed to schedule her next follow up appointment today.  I, Burt Knack, am acting as transcriptionist for Quillian Quince, MD I have reviewed the above documentation for accuracy and completeness, and I agree with the above. -Quillian Quince, MD

## 2018-11-07 DIAGNOSIS — Z20828 Contact with and (suspected) exposure to other viral communicable diseases: Secondary | ICD-10-CM | POA: Diagnosis not present

## 2018-11-15 ENCOUNTER — Encounter (INDEPENDENT_AMBULATORY_CARE_PROVIDER_SITE_OTHER): Payer: Self-pay | Admitting: Family Medicine

## 2018-11-15 ENCOUNTER — Other Ambulatory Visit: Payer: Self-pay

## 2018-11-15 ENCOUNTER — Ambulatory Visit (INDEPENDENT_AMBULATORY_CARE_PROVIDER_SITE_OTHER): Payer: BC Managed Care – PPO | Admitting: Family Medicine

## 2018-11-15 VITALS — BP 122/84 | HR 111 | Temp 98.5°F | Ht 65.0 in | Wt 279.0 lb

## 2018-11-15 DIAGNOSIS — E8881 Metabolic syndrome: Secondary | ICD-10-CM

## 2018-11-15 DIAGNOSIS — Z9189 Other specified personal risk factors, not elsewhere classified: Secondary | ICD-10-CM

## 2018-11-15 DIAGNOSIS — F3289 Other specified depressive episodes: Secondary | ICD-10-CM | POA: Diagnosis not present

## 2018-11-15 DIAGNOSIS — Z6841 Body Mass Index (BMI) 40.0 and over, adult: Secondary | ICD-10-CM

## 2018-11-15 MED ORDER — BUPROPION HCL ER (SR) 150 MG PO TB12: 150.0000 mg | ORAL_TABLET | Freq: Every day | ORAL | 0 refills | Status: DC

## 2018-11-16 NOTE — Progress Notes (Signed)
Office: [phone]  /  Fax: [phone]   HPI:   Chief Complaint: OBESITY [name] is here to discuss her progress with her obesity treatment plan. She is on the Category 3 plan and is following her eating plan approximately 50 % of the time. She states she is exercising 0 minutes 0 times per week. [name] has done well with Category 3 plan overall. She has had a bad 2 weeks at home and notes increased emotional eating. She states her hunger is controlled.  Her weight is 279 lb (126.6 kg) today and has had a weight loss of 3 pounds over a period of 2 weeks since her last visit. She has lost 3 lbs since starting treatment with [place].  Insulin Resistance [name] has a new diagnosis of insulin resistance based on her elevated fasting insulin level >5. She has a normal A1c and glucose, but elevated fasting insulin. Although [name]'s blood glucose readings are still under good control, insulin resistance puts her at greater risk of metabolic syndrome and diabetes. She is not taking metformin currently and continues to work on diet and exercise to decrease risk of diabetes.  At risk for diabetes [name] is at higher than average risk for developing diabetes due to her obesity and insulin resistance. She currently denies polyuria or polydipsia.  Depression with Emotional Eating Behaviors [name] has had a lot of stress at work and at home. She is going to be separating from her husband soon and has struggled with increased emotional eating. [name] struggles with emotional eating and using food for comfort to the extent that it is negatively impacting her health. She often snacks when she is not hungry. [name] sometimes feels she is out of control and then feels guilty that she made poor food choices. She has been working on behavior modification techniques to help reduce her emotional eating and has been somewhat successful. She shows no sign of suicidal or homicidal ideations.  Depression  screen [name] 2/9 11/01/2018 07/21/2018 05/20/2018 04/29/2018  Decreased Interest 2 0 0 0  Down, Depressed, Hopeless 0  PHQ - 2 Score 0  Altered sleeping Tired, decreased energy 3 0 0 0  Change in appetite 2 0 0 0  Feeling bad or failure about yourself  1 0 0 0  Trouble concentrating 2 0 0 0  Moving slowly or fidgety/restless 0 0 0 0  Suicidal thoughts 0 0 0 0  PHQ-9 Score Difficult doing work/chores Not difficult at all Somewhat difficult - Not difficult at all  Some encounter information is confidential and restricted. Go to Review Flowsheets activity to see all data.    ASSESSMENT AND PLAN:  Insulin resistance  Other depression - with emotional eating - Plan: buPROPion (WELLBUTRIN SR) 150 MG 12 hr tablet  At risk for diabetes mellitus  Class 3 severe obesity with serious comorbidity and body mass index (BMI) of 45.0 to 49.9 in adult, unspecified obesity type (HCC)  PLAN:  Insulin Resistance [name] will continue to work on weight loss, diet, exercise, and decreasing simple carbohydrates in her diet to help decrease the risk of diabetes. We dicussed metformin including benefits and risks. She was informed that eating too many simple carbohydrates or too many calories at one sitting increases the likelihood of GI side effects. [name] declined metformin for now and prescription was not written today. [name] agrees to follow up with [place] as directed to monitor  her progress.  Diabetes risk counseling [name] was given extended (30 minutes) diabetes prevention counseling today. She is 33 y.o. female and has risk factors for diabetes including obesity and insulin resistance. We discussed intensive lifestyle modifications today with an emphasis on weight loss as well as increasing exercise and decreasing simple carbohydrates in her diet.  Depression with Emotional Eating Behaviors We discussed behavior modification techniques today to help [name] deal with  her emotional eating and depression. [name] agrees to continue taking Effexor, and start Wellbutrin SR 150 mg PO q AM #30 with no refills. [name] agrees to follow up with our clinic in 2 weeks.  Obesity [name] is currently in the action stage of change. As such, her goal is to continue with weight loss efforts She has agreed to follow the Category 3 plan [name] has been instructed to work up to a goal of 150 minutes of combined cardio and strengthening exercise per week for weight loss and overall health benefits. We discussed the following Behavioral Modification Strategies today: increasing lean protein intake and work on meal planning and easy cooking plans   [name] has agreed to follow up with our clinic in 2 weeks. She was informed of the importance of frequent follow up visits to maximize her success with intensive lifestyle modifications for her multiple health conditions.  ALLERGIES: No Known Allergies  MEDICATIONS: Current Outpatient Medications on File Prior to Visit  Medication Sig Dispense Refill  . amitriptyline (ELAVIL) 25 MG tablet TAKE 2 TABLETS BY MOUTH AT BEDTIME 180 tablet 1  . Biotin 5000 MCG CAPS Take 1 capsule by mouth daily.    . butalbital-acetaminophen-caffeine (FIORICET, ESGIC) 50-325-40 MG per tablet Take 1 tablet by mouth 2 (two) times daily as needed for headache.    . cetirizine (ZYRTEC) 10 MG tablet Take 10 mg by mouth daily.    . cimetidine (TAGAMET) 200 MG tablet Take 200 mg by mouth 2 (two) times daily.    Marland Kitchen dicyclomine (BENTYL) 10 MG capsule Take 10 mg by mouth as needed for spasms.    . fluticasone (FLONASE) 50 MCG/ACT nasal spray Place 2 sprays into both nostrils daily. 16 g 6  . Melatonin 10 MG TABS Take 1 tablet by mouth at bedtime.    . Multiple Vitamin (MULTIVITAMIN) tablet Take 1 tablet by mouth daily.    . Probiotic Product (PROBIOTIC-10 PO) Take 1 tablet by mouth 2 (two) times daily.     Marland Kitchen spironolactone (ALDACTONE) 100 MG tablet Take  100 mg by mouth daily.  4  . SPRINTEC 28 0.25-35 MG-MCG tablet Take 1 tablet by mouth daily.  4  . temazepam (RESTORIL) 15 MG capsule TAKE 1 CAPSULE BY MOUTH AT BEDTIME AS NEEDED FOR SLEEP. 30 capsule 1  . venlafaxine XR (EFFEXOR-XR) 150 MG 24 hr capsule TAKE ONE CAPSULE BY MOUTH EVERY MORNING WITH BREAKFAST 90 capsule 1  . SUMAtriptan (IMITREX) 50 MG tablet Take 1 tablet (50 mg total) by mouth once for 1 dose. May repeat in 2 hours if headache persists or recurs. 10 tablet 0   No current facility-administered medications on file prior to visit.     PAST MEDICAL HISTORY: Past Medical History:  Diagnosis Date  . Allergy   . Anxiety   . Back pain   . Constipation   . Depression   . Dyspnea   . Gallbladder problem   . GERD (gastroesophageal reflux disease)   . IBS (irritable bowel syndrome)   . Interstitial cystitis   . Lactose  intolerance   . Migraine   . Overactive bladder   . Palpitations   . PCOS (polycystic ovarian syndrome)   . Pelvic pain   . Pseudotumor cerebri   . Pseudotumor cerebri   . SUI (stress urinary incontinence, female)   . Urinary incontinence     PAST SURGICAL HISTORY: Past Surgical History:  Procedure Laterality Date  . CESAREAN SECTION  2009  &  2011   2011 W/ BILATERAL TUBAL LIGATION  . CHOLECYSTECTOMY  2010  . CYSTO WITH HYDRODISTENSION  03/01/2012   Procedure: CYSTOSCOPY/HYDRODISTENSION;  Surgeon: Martina Sinner, MD;  Location: [facility];  Service: Urology;  Laterality: N/A;  INSTILLATION of marcaine and pyridium.  . PUBOVAGINAL SLING N/A 02/14/2013   Procedure: PUBO-VAGINAL SLING AND HYDRODISTENSION;  Surgeon: Martina Sinner, MD;  Location: [facility];  Service: Urology;  Laterality: N/A;  . TRANSTHORACIC ECHOCARDIOGRAM  06/2009  . TUBAL LIGATION      SOCIAL HISTORY: Social History   Tobacco Use  . Smoking status: Never Smoker  . Smokeless tobacco: Never Used  Substance Use Topics  . Alcohol use: No  .  Drug use: No    FAMILY HISTORY: Family History  Problem Relation Age of Onset  . Hypertension Mother   . Depression Mother   . Diabetes Mother   . Anxiety disorder Mother   . Sleep apnea Mother   . Obesity Mother   . Hypertension Father   . Cancer Father        kidney  . Depression Father   . Hyperlipidemia Father   . Kidney cancer Father   . Alcoholism Father   . Obesity Father   . Cancer Maternal Uncle        stomach  . Hypertension Maternal Grandmother   . Cancer Maternal Grandmother        kidney, bone  . Hypertension Maternal Grandfather   . Cancer Maternal Grandfather        lung, bone  . Stroke Maternal Grandfather   . Hypertension Paternal Grandmother   . Hypertension Paternal Grandfather   . Stroke Paternal Grandfather     ROS: Review of Systems  Constitutional: Positive for weight loss.  Genitourinary: Negative for frequency.  Endo/Heme/Allergies: Negative for polydipsia.  Psychiatric/Behavioral: Positive for depression. Negative for suicidal ideas.    PHYSICAL EXAM: Blood pressure 122/84, pulse (!) 111, temperature 98.5 F (36.9 C), temperature source Oral, height 5\' 5"  (1.651 m), weight 279 lb (126.6 kg), last menstrual period 10/17/2018, SpO2 94 %. Body mass index is 46.43 kg/m. Physical Exam Vitals signs reviewed.  Constitutional:      Appearance: Normal appearance. She is obese.  Cardiovascular:     Rate and Rhythm: Normal rate.     Pulses: Normal pulses.  Pulmonary:     Effort: Pulmonary effort is normal.     Breath sounds: Normal breath sounds.  Musculoskeletal: Normal range of motion.  Skin:    General: Skin is warm and dry.  Neurological:     Mental Status: She is alert and oriented to person, place, and time.  Psychiatric:        Mood and Affect: Mood normal.        Behavior: Behavior normal.     RECENT LABS AND TESTS: BMET    Component Value Date/Time   NA 137 11/01/2018 1030   K 4.2 11/01/2018 1030   CL 102 11/01/2018  1030   CO2 22 11/01/2018 1030   GLUCOSE 93 11/01/2018  1030   GLUCOSE 105 (H) 05/24/2018 1515   BUN 15 11/01/2018 1030   CREATININE 0.76 11/01/2018 1030   CALCIUM 9.1 11/01/2018 1030   GFRNONAA 102 11/01/2018 1030   GFRAA 118 11/01/2018 1030   Lab Results  Component Value Date   HGBA1C 5.5 11/01/2018   HGBA1C 5.5 07/06/2017   HGBA1C 5.7 05/05/2016   Lab Results  Component Value Date   INSULIN 23.9 11/01/2018   CBC    Component Value Date/Time   WBC 8.1 11/01/2018 1030   WBC 9.7 05/24/2018 1515   RBC 4.43 11/01/2018 1030   RBC 4.53 05/24/2018 1515   HGB 13.9 11/01/2018 1030   HCT 41.6 11/01/2018 1030   PLT 367 11/01/2018 1030   MCV 94 11/01/2018 1030   MCH 31.4 11/01/2018 1030   MCH 32.5 04/23/2014 1754   MCHC 33.4 11/01/2018 1030   MCHC 33.8 05/24/2018 1515   RDW 13.0 11/01/2018 1030   LYMPHSABS 2.5 11/01/2018 1030   MONOABS 0.5 05/24/2018 1515   EOSABS 0.1 11/01/2018 1030   BASOSABS 0.1 11/01/2018 1030   Iron/TIBC/Ferritin/ %Sat No results found for: IRON, TIBC, FERRITIN, IRONPCTSAT Lipid Panel     Component Value Date/Time   CHOL 128 11/01/2018 1030   TRIG 86 11/01/2018 1030   HDL 58 11/01/2018 1030   CHOLHDL 2 05/05/2016 1019   VLDL 26.2 05/05/2016 1019   LDLCALC 53 11/01/2018 1030   Hepatic Function Panel     Component Value Date/Time   PROT 6.6 11/01/2018 1030   ALBUMIN 3.8 11/01/2018 1030   AST 17 11/01/2018 1030   ALT 20 11/01/2018 1030   ALKPHOS 54 11/01/2018 1030   BILITOT <0.2 11/01/2018 1030      Component Value Date/Time   TSH 1.500 11/01/2018 1030   TSH 1.33 07/12/2018   TSH 3.24 07/19/2017 1013   TSH 4.08 07/06/2017   TSH 1.25 05/05/2016 1019      OBESITY BEHAVIORAL INTERVENTION VISIT  Today's visit was # 2   Starting weight: 282 lbs Starting date: 11/01/2018 Today's weight : 279 lbs Today's date: 11/15/2018 Total lbs lost to date: 3    ASK: We discussed the diagnosis of obesity with [name] today and  [name] agreed to give us permission to discuss obesity behavioral modification therapy today.  ASSESS: [name] has the diagnosis of obesity and her BMI today is 46.43 [name] is in the action stage of change   ADVISE: [name] was educated on the multiple health risks of obesity as well as the benefit of weight loss to improve her health. She was advised of the need for long term treatment and the importance of lifestyle modifications to improve her current health and to decrease her risk of future health problems.  AGREE: Multiple dietary modification options and treatment options were discussed and  [name] agreed to follow the recommendations documented in the above note.  ARRANGE: [name] was educated on the importance of frequent visits to treat obesity as outlined per CMS and USPSTF guidelines and agreed to schedule her next follow up appointment today.  I, [name], am acting as transcriptionist for Samantha Quincearen Donneisha Beane, MD  I have reviewed the above documentation for accuracy and completeness, and I agree with the above. -Samantha Quincearen Monte Zinni, MD

## 2018-11-18 ENCOUNTER — Other Ambulatory Visit: Payer: Self-pay

## 2018-11-18 DIAGNOSIS — Z20822 Contact with and (suspected) exposure to covid-19: Secondary | ICD-10-CM

## 2018-11-18 DIAGNOSIS — Z20828 Contact with and (suspected) exposure to other viral communicable diseases: Secondary | ICD-10-CM | POA: Diagnosis not present

## 2018-11-19 LAB — NOVEL CORONAVIRUS, NAA: SARS-CoV-2, NAA: NOT DETECTED

## 2018-11-28 ENCOUNTER — Ambulatory Visit (INDEPENDENT_AMBULATORY_CARE_PROVIDER_SITE_OTHER): Payer: BC Managed Care – PPO | Admitting: Family Medicine

## 2018-12-01 ENCOUNTER — Other Ambulatory Visit: Payer: Self-pay | Admitting: Physician Assistant

## 2018-12-07 ENCOUNTER — Other Ambulatory Visit (INDEPENDENT_AMBULATORY_CARE_PROVIDER_SITE_OTHER): Payer: Self-pay | Admitting: Family Medicine

## 2018-12-07 DIAGNOSIS — F3289 Other specified depressive episodes: Secondary | ICD-10-CM

## 2018-12-08 ENCOUNTER — Ambulatory Visit (INDEPENDENT_AMBULATORY_CARE_PROVIDER_SITE_OTHER): Payer: BC Managed Care – PPO | Admitting: Family Medicine

## 2018-12-13 ENCOUNTER — Other Ambulatory Visit (INDEPENDENT_AMBULATORY_CARE_PROVIDER_SITE_OTHER): Payer: Self-pay | Admitting: Family Medicine

## 2018-12-13 DIAGNOSIS — F411 Generalized anxiety disorder: Secondary | ICD-10-CM | POA: Diagnosis not present

## 2018-12-13 DIAGNOSIS — F3289 Other specified depressive episodes: Secondary | ICD-10-CM

## 2018-12-14 ENCOUNTER — Other Ambulatory Visit (INDEPENDENT_AMBULATORY_CARE_PROVIDER_SITE_OTHER): Payer: Self-pay | Admitting: Family Medicine

## 2018-12-14 DIAGNOSIS — F3289 Other specified depressive episodes: Secondary | ICD-10-CM

## 2018-12-14 MED ORDER — BUPROPION HCL ER (SR) 150 MG PO TB12: 150.0000 mg | ORAL_TABLET | Freq: Every day | ORAL | 0 refills | Status: DC

## 2018-12-19 ENCOUNTER — Ambulatory Visit (INDEPENDENT_AMBULATORY_CARE_PROVIDER_SITE_OTHER): Payer: BC Managed Care – PPO | Admitting: Family Medicine

## 2018-12-19 ENCOUNTER — Encounter (INDEPENDENT_AMBULATORY_CARE_PROVIDER_SITE_OTHER): Payer: Self-pay

## 2018-12-21 DIAGNOSIS — F411 Generalized anxiety disorder: Secondary | ICD-10-CM | POA: Diagnosis not present

## 2018-12-25 DIAGNOSIS — J209 Acute bronchitis, unspecified: Secondary | ICD-10-CM | POA: Diagnosis not present

## 2018-12-25 DIAGNOSIS — Z20828 Contact with and (suspected) exposure to other viral communicable diseases: Secondary | ICD-10-CM | POA: Diagnosis not present

## 2018-12-28 ENCOUNTER — Encounter (HOSPITAL_COMMUNITY): Payer: Self-pay | Admitting: Emergency Medicine

## 2018-12-28 ENCOUNTER — Other Ambulatory Visit: Payer: Self-pay | Admitting: Physician Assistant

## 2018-12-28 ENCOUNTER — Ambulatory Visit (INDEPENDENT_AMBULATORY_CARE_PROVIDER_SITE_OTHER)
Admission: EM | Admit: 2018-12-28 | Discharge: 2018-12-28 | Disposition: A | Discharge status: Home / Self Care | Payer: BC Managed Care – PPO | Source: Home / Self Care | Attending: Emergency Medicine | Admitting: Emergency Medicine

## 2018-12-28 ENCOUNTER — Emergency Department (HOSPITAL_COMMUNITY)
Admission: EM | Admit: 2018-12-28 | Discharge: 2018-12-28 | Disposition: A | Discharge status: Home / Self Care | Payer: BC Managed Care – PPO | Attending: Emergency Medicine | Admitting: Emergency Medicine

## 2018-12-28 ENCOUNTER — Ambulatory Visit (INDEPENDENT_AMBULATORY_CARE_PROVIDER_SITE_OTHER): Payer: BC Managed Care – PPO

## 2018-12-28 ENCOUNTER — Other Ambulatory Visit: Payer: Self-pay

## 2018-12-28 ENCOUNTER — Emergency Department (HOSPITAL_COMMUNITY): Payer: BC Managed Care – PPO

## 2018-12-28 ENCOUNTER — Encounter (HOSPITAL_COMMUNITY): Payer: Self-pay | Admitting: Urgent Care

## 2018-12-28 DIAGNOSIS — Z20828 Contact with and (suspected) exposure to other viral communicable diseases: Secondary | ICD-10-CM | POA: Insufficient documentation

## 2018-12-28 DIAGNOSIS — R059 Cough, unspecified: Secondary | ICD-10-CM

## 2018-12-28 DIAGNOSIS — R05 Cough: Secondary | ICD-10-CM

## 2018-12-28 DIAGNOSIS — R0902 Hypoxemia: Secondary | ICD-10-CM

## 2018-12-28 DIAGNOSIS — R0602 Shortness of breath: Secondary | ICD-10-CM

## 2018-12-28 DIAGNOSIS — R0781 Pleurodynia: Secondary | ICD-10-CM

## 2018-12-28 DIAGNOSIS — R5383 Other fatigue: Secondary | ICD-10-CM | POA: Insufficient documentation

## 2018-12-28 DIAGNOSIS — R0789 Other chest pain: Secondary | ICD-10-CM

## 2018-12-28 DIAGNOSIS — Z79899 Other long term (current) drug therapy: Secondary | ICD-10-CM | POA: Insufficient documentation

## 2018-12-28 DIAGNOSIS — R000 Tachycardia, unspecified: Secondary | ICD-10-CM

## 2018-12-28 LAB — CBC
HCT: 44 % (ref 36.0–46.0)
Hemoglobin: 14.8 g/dL (ref 12.0–15.0)
MCH: 32 pg (ref 26.0–34.0)
MCHC: 33.6 g/dL (ref 30.0–36.0)
MCV: 95.2 fL (ref 80.0–100.0)
Platelets: 437 10*3/uL — ABNORMAL HIGH (ref 150–400)
RBC: 4.62 MIL/uL (ref 3.87–5.11)
RDW: 12.9 % (ref 11.5–15.5)
WBC: 8.8 10*3/uL (ref 4.0–10.5)
nRBC: 0 % (ref 0.0–0.2)

## 2018-12-28 LAB — BASIC METABOLIC PANEL
Anion gap: 9 (ref 5–15)
BUN: 12 mg/dL (ref 6–20)
CO2: 24 mmol/L (ref 22–32)
Calcium: 9.3 mg/dL (ref 8.9–10.3)
Chloride: 103 mmol/L (ref 98–111)
Creatinine, Ser: 0.87 mg/dL (ref 0.44–1.00)
GFR calc Af Amer: >60 mL/min (ref >60)
GFR calc non Af Amer: >60 mL/min (ref >60)
Glucose, Bld: 89 mg/dL (ref 70–99)
Potassium: 4.3 mmol/L (ref 3.5–5.1)
Sodium: 136 mmol/L (ref 135–145)

## 2018-12-28 LAB — TROPONIN I (HIGH SENSITIVITY)
Troponin I (High Sensitivity): 3 ng/L (ref <18)
Troponin I (High Sensitivity): <2 ng/L (ref <18)

## 2018-12-28 LAB — I-STAT BETA HCG BLOOD, ED (MC, WL, AP ONLY): I-stat hCG, quantitative: <5 m[IU]/mL (ref <5)

## 2018-12-28 LAB — TSH: TSH: 1.84 u[IU]/mL (ref 0.350–4.500)

## 2018-12-28 MED ORDER — SODIUM CHLORIDE 0.9 % IV BOLUS
1000.0000 mL | Freq: Once | INTRAVENOUS | Status: AC
  Administered 2018-12-28: 1000 mL via INTRAVENOUS

## 2018-12-28 MED ORDER — PREDNISONE 20 MG PO TABS: 40.0000 mg | ORAL_TABLET | Freq: Every day | ORAL | 0 refills | Status: AC

## 2018-12-28 MED ORDER — ALBUTEROL SULFATE HFA 108 (90 BASE) MCG/ACT IN AERS
2.0000 | INHALATION_SPRAY | Freq: Once | RESPIRATORY_TRACT | Status: AC
  Administered 2018-12-28: 2 via RESPIRATORY_TRACT
  Filled 2018-12-28: qty 6.7

## 2018-12-28 MED ORDER — IOHEXOL 350 MG/ML SOLN
75.0000 mL | Freq: Once | INTRAVENOUS | Status: AC | PRN
  Administered 2018-12-28: 75 mL via INTRAVENOUS

## 2018-12-28 MED ORDER — SODIUM CHLORIDE 0.9% FLUSH: 3.0000 mL | Freq: Once | INTRAVENOUS | Status: DC

## 2018-12-28 NOTE — Discharge Instructions (Addendum)
Take the steroids as prescribed.  Use your albuterol inhaler 2-3 times a day.  If your symptoms do not improve or worsen please return to emergency department otherwise help with your primary care provider in the next 2 days.

## 2018-12-28 NOTE — ED Notes (Signed)
Pt verbalized understanding of d/c instructions, follow up care and prescriptions. Pt had no further questions.

## 2018-12-28 NOTE — ED Notes (Signed)
Patient transported to CT 

## 2018-12-28 NOTE — ED Provider Notes (Signed)
[facility] EMERGENCY DEPARTMENT Provider Note   CSN: [id number] Arrival date & time: 12/28/18  1800   History   Chief Complaint Chief Complaint  Patient presents with   Chest Pain   Shortness of Breath   HPI [name] is a 33 y.o. female with history significant for anxiety, PCOS, palpitations, pseudotumor cerebri, elevated TSH without formal diagnosis of thyroidism who presents for evaluation of shortness of breath. Admits to 1 week of non productive cough, SOB and pleuritic CP. CP will occasionally radiate into her back. SOB and COB is intermittent. Seen at [facility] last week with azithromycin script and COVID test. COVID negative. Did not fill the azithromycin. Initially had a scratchy throat however none currently. Tolerating PO intake at home without difficulty. Temp max 100.2 at home 2 days ago. No N/V, hemoptysis, Abd pain, dysuria, diarrhea, rashes or lesions. Denies hx of PE, DVT, ACS, asthma, COPD however will yearly get bronchitis and have to take steroids and an albuterol inhaler. Has tried an inhaler once at home without relief. Denies COVID contacts however works as a Engineer, civil (consulting). Denies tobacco use, Hx HTN, Hyperlipidemia, family hx of MI at early age.  No recent surgeries, immobilization, malignancy. Seen urgent care today and was noted to be tachycardic to 120.  She had chest x-ray at that time which was negative for acute infiltrates, cardiomegaly, pneumothorax.  Sent here to rule out a PE as cause of her shortness of breath and pleuritic chest pain. She was re-swabbed for COVID at the UC today.  History obtained from patient and past medical records. No interpretor was used.     HPI  Past Medical History:  Diagnosis Date   Allergy    Anxiety    Back pain    Constipation    Depression    Dyspnea    Gallbladder problem    GERD (gastroesophageal reflux disease)    IBS (irritable bowel syndrome)    Interstitial cystitis    Lactose  intolerance    Migraine    Overactive bladder    Palpitations    PCOS (polycystic ovarian syndrome)    Pelvic pain    Pseudotumor cerebri    Pseudotumor cerebri    SUI (stress urinary incontinence, female)    Urinary incontinence     Patient Active Problem List   Diagnosis Date Noted   Elevated TSH 07/19/2017   Fatigue 07/19/2017   Visit for preventive health examination 05/05/2016   Anxiety and depression 01/18/2016   Insomnia 01/18/2016   Chronic migraine 01/18/2016   CARDIOMEGALY 06/13/2009    Past Surgical History:  Procedure Laterality Date   CESAREAN SECTION  2009  &  2011   2011 W/ BILATERAL TUBAL LIGATION   CHOLECYSTECTOMY  2010   CYSTO WITH HYDRODISTENSION  03/01/2012   Procedure: CYSTOSCOPY/HYDRODISTENSION;  Surgeon: Martina Sinner, MD;  Location: [facility];  Service: Urology;  Laterality: N/A;  INSTILLATION of marcaine and pyridium.   PUBOVAGINAL SLING N/A 02/14/2013   Procedure: PUBO-VAGINAL SLING AND HYDRODISTENSION;  Surgeon: Martina Sinner, MD;  Location: [facility];  Service: Urology;  Laterality: N/A;   TRANSTHORACIC ECHOCARDIOGRAM  06/2009   TUBAL LIGATION       OB History    Gravida  3   Para      Term      Preterm      AB  1   Living  2     SAB  1  TAB      Ectopic      Multiple      Live Births               Home Medications    Prior to Admission medications   Medication Sig Start Date End Date Taking? Authorizing Provider  amitriptyline (ELAVIL) 25 MG tablet TAKE 2 TABLETS BY MOUTH AT BEDTIME Patient taking differently: Take 50 mg by mouth at bedtime.  09/29/18   Waldon Merl, PA-C  azithromycin (ZITHROMAX) 500 MG tablet Take 500 mg by mouth daily. 12/25/18   [provider]  Biotin 5000 MCG CAPS Take 1 capsule by mouth daily.    [provider]  buPROPion (WELLBUTRIN SR) 150 MG 12 hr tablet Take 1 tablet (150 mg total) by mouth daily.  12/14/18   Quillian Quince D, MD  butalbital-acetaminophen-caffeine (FIORICET, ESGIC) [id number] MG per tablet Take 1 tablet by mouth 2 (two) times daily as needed for headache.    [provider]  cetirizine (ZYRTEC) 10 MG tablet Take 10 mg by mouth daily.    [provider]  cimetidine (TAGAMET) 200 MG tablet Take 200 mg by mouth 2 (two) times daily.    [provider]  dicyclomine (BENTYL) 10 MG capsule Take 10 mg by mouth as needed for spasms.    [provider]  fluticasone (FLONASE) 50 MCG/ACT nasal spray Place 2 sprays into both nostrils daily. 04/20/18   Jannifer Rodney A, FNP  Melatonin 10 MG TABS Take 10 mg by mouth at bedtime.     [provider]  Multiple Vitamin (MULTIVITAMIN) tablet Take 1 tablet by mouth daily.    [provider]  predniSONE (DELTASONE) 20 MG tablet Take 2 tablets (40 mg total) by mouth daily for 5 days. 12/28/18 01/02/19  Jamiya Nims A, PA-C  Probiotic Product (PROBIOTIC-10 PO) Take 1 tablet by mouth 2 (two) times daily.     [provider]  spironolactone (ALDACTONE) 100 MG tablet Take 100 mg by mouth daily. 12/03/15   [provider]  SPRINTEC 28 0.25-35 MG-MCG tablet Take 1 tablet by mouth daily. 12/01/15   [provider]  SUMAtriptan (IMITREX) 50 MG tablet Take 1 tablet (50 mg total) by mouth once for 1 dose. May repeat in 2 hours if headache persists or recurs. 10/26/17 12/28/18  Waldon Merl, PA-C  temazepam (RESTORIL) 15 MG capsule TAKE 1 CAPSULE BY MOUTH AT BEDTIME AS NEEDED FOR SLEEP. Patient taking differently: Take 15 mg by mouth at bedtime as needed for sleep.  10/20/18   Waldon Merl, PA-C  venlafaxine XR (EFFEXOR-XR) 150 MG 24 hr capsule TAKE ONE CAPSULE BY MOUTH EVERY MORNING WITH BREAKFAST Patient taking differently: Take 150 mg by mouth daily with breakfast.  12/02/18   Waldon Merl, PA-C    Family History Family History  Problem Relation Age of Onset    Hypertension Mother    Depression Mother    Diabetes Mother    Anxiety disorder Mother    Sleep apnea Mother    Obesity Mother    Hypertension Father    Cancer Father        kidney   Depression Father    Hyperlipidemia Father    Kidney cancer Father    Alcoholism Father    Obesity Father    Cancer Maternal Uncle        stomach   Hypertension Maternal Grandmother    Cancer Maternal Grandmother  kidney, bone   Hypertension Maternal Grandfather    Cancer Maternal Grandfather        lung, bone   Stroke Maternal Grandfather    Hypertension Paternal Grandmother    Hypertension Paternal Grandfather    Stroke Paternal Grandfather     Social History Social History   Tobacco Use   Smoking status: Never Smoker   Smokeless tobacco: Never Used  Substance Use Topics   Alcohol use: No   Drug use: No     Allergies   Patient has no known allergies.   Review of Systems Review of Systems  Constitutional: Positive for fatigue (Generalized) and fever (Tmax 100.2 at home). Negative for activity change, appetite change, chills and diaphoresis.  HENT: Positive for sore throat (Last week, none currently). Negative for congestion, ear discharge, ear pain, facial swelling, postnasal drip, rhinorrhea, sinus pressure, sinus pain, sneezing and voice change.   Eyes: Negative.   Respiratory: Positive for cough and shortness of breath. Negative for choking, chest tightness, wheezing and stridor.   Cardiovascular: Positive for chest pain (Pleuritic CP).  Gastrointestinal: Negative.   Genitourinary: Negative.   Musculoskeletal: Negative.   Skin: Negative.   Neurological: Negative.   All other systems reviewed and are negative.    Physical Exam Updated Vital Signs BP 110/67    Pulse 96    Temp 98.5 F (36.9 C) (Oral)    Resp 13    LMP 12/14/2018 (Approximate)    SpO2 99%   Physical Exam Vitals signs and nursing note reviewed.  Constitutional:       General: She is not in acute distress.    Appearance: She is not ill-appearing, toxic-appearing or diaphoretic.  HENT:     Head: Normocephalic and atraumatic.     Jaw: There is normal jaw occlusion.     Right Ear: Tympanic membrane, ear canal and external ear normal. There is no impacted cerumen. No hemotympanum. Tympanic membrane is not injected, scarred, perforated, erythematous, retracted or bulging.     Left Ear: Tympanic membrane, ear canal and external ear normal. There is no impacted cerumen. No hemotympanum. Tympanic membrane is not injected, scarred, perforated, erythematous, retracted or bulging.     Ears:     Comments: No Mastoid tenderness.    Nose:     Comments: Clear rhinorrhea and congestion to bilateral nares.  No sinus tenderness.    Mouth/Throat:     Lips: Pink.     Mouth: Mucous membranes are moist.     Pharynx: Oropharynx is clear. Uvula midline.     Comments: Posterior oropharynx clear.  Mucous membranes moist.  Tonsils without erythema or exudate.  Uvula midline without deviation.  No evidence of PTA or RPA.  No drooling, dysphasia or trismus.  Phonation normal. Neck:     Musculoskeletal: Normal range of motion.     Trachea: Trachea and phonation normal.     Meningeal: Brudzinski's sign and Kernig's sign absent.     Comments: No Neck stiffness or neck rigidity.  No meningismus.  No cervical lymphadenopathy. Cardiovascular:     Rate and Rhythm: Tachycardia present.     Pulses:          Radial pulses are 2+ on the right side and 2+ on the left side.     Heart sounds: Normal heart sounds.     Comments: No murmurs rubs or gallops. HR 95 in room Pulmonary:     Comments: Clear to auscultation bilaterally without wheeze, rhonchi or rales.  No  accessory muscle usage.  Able speak in full sentences. Abdominal:     Comments: Soft, nontender without rebound or guarding.  No CVA tenderness.  Musculoskeletal:     Comments: Moves all 4 extremities without difficulty.  Lower  extremities without edema, erythema or warmth.  Skin:    Comments: Brisk capillary refill.  No rashes or lesions.  Neurological:     Mental Status: She is alert.     Comments: Ambulatory in department without difficulty.  Cranial nerves II through XII grossly intact.  No facial droop.  No aphasia.     ED Treatments / Results  Labs (all labs ordered are listed, but only abnormal results are displayed) Labs Reviewed  CBC - Abnormal; Notable for the following components:      Result Value   Platelets 437 (*)    All other components within normal limits  BASIC METABOLIC PANEL  TSH  I-STAT BETA HCG BLOOD, ED ([facility], [facility], [facility] ONLY)  TROPONIN I (HIGH SENSITIVITY)  TROPONIN I (HIGH SENSITIVITY)    EKG None  Radiology Dg Chest 2 View  Result Date: 12/28/2018 CLINICAL DATA:  Shortness of breath and tachycardia EXAM: CHEST - 2 VIEW COMPARISON:  07/29/2006 FINDINGS: The heart size and mediastinal contours are within normal limits. Minimal atelectasis or scar at the lingula. The visualized skeletal structures are unremarkable. IMPRESSION: No active cardiopulmonary disease. Electronically Signed   By: Donavan Foil M.D.   On: 12/28/2018 17:38   Ct Angio Chest Pe W/cm &/or Wo Cm  Result Date: 12/28/2018 CLINICAL DATA:  33 year old female with central chest pain radiating to the back. Shortness of breath. EXAM: CT ANGIOGRAPHY CHEST WITH CONTRAST TECHNIQUE: Multidetector CT imaging of the chest was performed using the standard protocol during bolus administration of intravenous contrast. Multiplanar CT image reconstructions and MIPs were obtained to evaluate the vascular anatomy. CONTRAST:  75mL OMNIPAQUE IOHEXOL 350 MG/ML SOLN COMPARISON:  Chest radiographs earlier today. CTA chest 05/25/2009. FINDINGS: Cardiovascular: Initially poor contrast bolus timing in the pulmonary arterial tree. But this was repeated at 2324 hours with good contrast timing. No focal filling defect identified in the pulmonary  arteries to suggest acute pulmonary embolism. Negative visible aorta. No definite calcified coronary artery atherosclerosis. Cardiac size at the upper limits of normal. No pericardial effusion. Mediastinum/Nodes: Negative. No lymphadenopathy. Lungs/Pleura: Major airways are patent. Low lung volumes similar to the 2011 exam. Mild perihilar and dependent pulmonary ground-glass opacity, with mild bilateral lower lobe mosaic attenuation. This most resembles a mixture of atelectasis and gas trapping. With the 2nd set of lung images on 20/3 24 hours lung volumes are improved and the atelectatic opacity has substantially regressed. No pleural effusion or inflammatory appearing pulmonary opacity. Upper Abdomen: Negative visible liver, spleen, adrenal glands and bowel in the upper abdomen. Musculoskeletal: Age advanced disc and endplate degeneration in the thoracic spine. No acute osseous abnormality identified. Review of the MIP images confirms the above findings. IMPRESSION: 1. No evidence of acute pulmonary embolus. 2. Mild atelectasis. No other acute findings in the chest. Electronically Signed   By: Genevie Ann M.D.   On: 12/28/2018 23:34    Procedures Procedures (including critical care time)  Medications Ordered in ED Medications  sodium chloride flush (NS) 0.9 % injection 3 mL (3 mLs Intravenous Not Given 12/28/18 2052)  sodium chloride 0.9 % bolus 1,000 mL (1,000 mLs Intravenous New Bag/Given 12/28/18 2142)  albuterol (VENTOLIN HFA) 108 (90 Base) MCG/ACT inhaler 2 puff (2 puffs Inhalation Given 12/28/18 2142)  iohexol (OMNIPAQUE) 350 MG/ML injection 75 mL (75 mLs Intravenous Contrast Given 12/28/18 2207)   Initial Impression / Assessment and Plan / ED Course  I have reviewed the triage vital signs and the nursing notes.  Pertinent labs & imaging results that were available during my care of the patient were reviewed by me and considered in my medical decision making (see chart for details).  33 year old  presents for evaluation of SOB and pleuritic CP along with cough over the last week. Afebrile, non septic, non ill appearing. Tachycardic on arrival to 120 however tachycardic to 95 in room. Heart and lungs clear. Abd soft, No DVT on exam. Seen by UC today and sent for CTA chest to r/o PE as cause of SOB. No evidence of fluid overload on exam. Homans sign negative.  Labs and imaging personally reviewed: Chest x-ray from urgent care personally reviewed, no infiltrates, cardiomegaly, pulmonary edema, pneumothorax.  Clinical Course as of Dec 28 2346  Wed Dec 28, 2018  2137 No leukocytosis, Hgb stable at 14.8  CBC(!) [BH]  2138 No electrolyte, renal or liver abnormality  Basic metabolic panel [BH]  2138 negative  I-Stat beta hCG blood, ED [BH]  2138 Delta negative  Troponin I (High Sensitivity) [BH]  2345 No pulling over into computer. Sinus tachycardia at 103. Qtc 420. No ST/T changes. No STEMI, burga  ED EKG [BH]  2346 No acute process  CT Angio Chest PE W/Cm &/Or Wo Cm [BH]  2347 Normal TSH, Low suspicion for thyroid storm  TSH [BH]    Clinical Course User Index [BH] Bera Pinela A, PA-C   2310: Radiology requesting re-imaging due to contrast dye bolus. Creatinine/GFR WNL.  2340: Patient ambulated in room with HR <100 and oxygen at 98% on RA. No dyspnea on exertion. Sx with some improvement with Albuterol. Suspect viral illness with possible costochondritis as cause of her CP. She did have a COVID/Flu test at [facility] today do not need to repeat. Will treat with symptomatic management. Low ACS, PE, dissection, bradycardia, myocarditis, bacterial infectious process, arrhythmia as cause of her symptoms. Patient to follow up  The patient has been appropriately medically screened and/or stabilized in the ED. I have low suspicion for any other emergent medical condition which would require further screening, evaluation or treatment in the ED or require inpatient management.  Patient is  hemodynamically stable and in no acute distress.  Patient able to ambulate in department prior to ED.  Evaluation does not show acute pathology that would require ongoing or additional emergent interventions while in the emergency department or further inpatient treatment.  I have discussed the diagnosis with the patient and answered all questions.  Pain is been managed while in the emergency department and patient has no further complaints prior to discharge.  Patient is comfortable with plan discussed in room and is stable for discharge at this time.  I have discussed strict return precautions for returning to the emergency department.  Patient was encouraged to follow-up with PCP/specialist refer to at discharge.    SKYLAH DELAUTER was evaluated in Emergency Department on 12/28/2018 for the symptoms described in the history of present illness. She was evaluated in the context of the global COVID-19 pandemic, which necessitated consideration that the patient might be at risk for infection with the SARS-CoV-2 virus that causes COVID-19. Institutional protocols and algorithms that pertain to the evaluation of patients at risk for COVID-19 are in a state of rapid change based on information  released by regulatory bodies including the CDC and federal and state organizations. These policies and algorithms were followed during the patient's care in the ED. Final Clinical Impressions(s) / ED Diagnoses   Final diagnoses:  SOB (shortness of breath)  Cough  Pleuritic chest pain    ED Discharge Orders         Ordered    predniSONE (DELTASONE) 20 MG tablet  Daily     12/28/18 2341           Aranda Bihm A, PA-C 12/28/18 2349    Benjiman Core, MD 12/29/18 2322

## 2018-12-28 NOTE — Discharge Instructions (Addendum)
Please report to the emergency room now as I am concerned that you may be having a chest clot given your significant shortness of breath, tachycardia and hypoxemia.  This can be ruled out with a chest CT which can be done in the ER.

## 2018-12-28 NOTE — ED Triage Notes (Signed)
Pt here for continued SOB and mild cough that started on Thursday of last week.  She first started with a ST on Thursday and by Saturday she had a mild fever of 100.2 and continued and worsening SOB and cough with upper back pain.  Pt was treated with an inhaler at that time and tested for Covid.  Pt's results came back negative on Monday.  Pt states the inhaler is not working.  She also complains of continued upper back pain and chest tightness from coughing.  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

## 2018-12-28 NOTE — ED Provider Notes (Signed)
[facility]   MRN: [id number] DOB: [birth year]  Subjective:   [name] is a 33 y.o. female presenting for 1 week hx of progressively worsening dry cough that has elicited chest tightness, chest pain. Had throat pain initially but has improved, now is more of a scratchy throat. Has also had shob, has difficulty even when she speaks. Had a COVID 19 test which was negative. Was given azithromycin but has not taken it. Denies hx of asthma, COPD. She is not a smoker.  No current facility-administered medications for this encounter.   Current Outpatient Medications:  .  amitriptyline (ELAVIL) 25 MG tablet, TAKE 2 TABLETS BY MOUTH AT BEDTIME, Disp: 180 tablet, Rfl: 1 .  Biotin 5000 MCG CAPS, Take 1 capsule by mouth daily., Disp: , Rfl:  .  buPROPion (WELLBUTRIN SR) 150 MG 12 hr tablet, Take 1 tablet (150 mg total) by mouth daily., Disp: 30 tablet, Rfl: 0 .  butalbital-acetaminophen-caffeine (FIORICET, ESGIC) 50-325-40 MG per tablet, Take 1 tablet by mouth 2 (two) times daily as needed for headache., Disp: , Rfl:  .  cetirizine (ZYRTEC) 10 MG tablet, Take 10 mg by mouth daily., Disp: , Rfl:  .  cimetidine (TAGAMET) 200 MG tablet, Take 200 mg by mouth 2 (two) times daily., Disp: , Rfl:  .  dicyclomine (BENTYL) 10 MG capsule, Take 10 mg by mouth as needed for spasms., Disp: , Rfl:  .  fluticasone (FLONASE) 50 MCG/ACT nasal spray, Place 2 sprays into both nostrils daily., Disp: 16 g, Rfl: 6 .  Melatonin 10 MG TABS, Take 1 tablet by mouth at bedtime., Disp: , Rfl:  .  Multiple Vitamin (MULTIVITAMIN) tablet, Take 1 tablet by mouth daily., Disp: , Rfl:  .  Probiotic Product (PROBIOTIC-10 PO), Take 1 tablet by mouth 2 (two) times daily. , Disp: , Rfl:  .  spironolactone (ALDACTONE) 100 MG tablet, Take 100 mg by mouth daily., Disp: , Rfl: 4 .  SPRINTEC 28 0.25-35 MG-MCG tablet, Take 1 tablet by mouth daily., Disp: , Rfl: 4 .  SUMAtriptan (IMITREX) 50 MG tablet, Take 1 tablet (50 mg total)  by mouth once for 1 dose. May repeat in 2 hours if headache persists or recurs., Disp: 10 tablet, Rfl: 0 .  temazepam (RESTORIL) 15 MG capsule, TAKE 1 CAPSULE BY MOUTH AT BEDTIME AS NEEDED FOR SLEEP., Disp: 30 capsule, Rfl: 1 .  venlafaxine XR (EFFEXOR-XR) 150 MG 24 hr capsule, TAKE ONE CAPSULE BY MOUTH EVERY MORNING WITH BREAKFAST, Disp: 90 capsule, Rfl: 1   No Known Allergies  Past Medical History:  Diagnosis Date  . Allergy   . Anxiety   . Back pain   . Constipation   . Depression   . Dyspnea   . Gallbladder problem   . GERD (gastroesophageal reflux disease)   . IBS (irritable bowel syndrome)   . Interstitial cystitis   . Lactose intolerance   . Migraine   . Overactive bladder   . Palpitations   . PCOS (polycystic ovarian syndrome)   . Pelvic pain   . Pseudotumor cerebri   . Pseudotumor cerebri   . SUI (stress urinary incontinence, female)   . Urinary incontinence      Past Surgical History:  Procedure Laterality Date  . CESAREAN SECTION  2009  &  2011   2011 W/ BILATERAL TUBAL LIGATION  . CHOLECYSTECTOMY  2010  . CYSTO WITH HYDRODISTENSION  03/01/2012   Procedure: CYSTOSCOPY/HYDRODISTENSION;  Surgeon: Martina Sinner, MD;  Location: [facility];  Service: Urology;  Laterality: N/A;  INSTILLATION of marcaine and pyridium.  . PUBOVAGINAL SLING N/A 02/14/2013   Procedure: PUBO-VAGINAL SLING AND HYDRODISTENSION;  Surgeon: Martina SinnerScott A MacDiarmid, MD;  [facility] [place];  Service: Urology;  Laterality: N/A;  . TRANSTHORACIC ECHOCARDIOGRAM  06/2009  . TUBAL LIGATION      Family History  Problem Relation Age of Onset  . Hypertension Mother   . Depression Mother   . Diabetes Mother   . Anxiety disorder Mother   . Sleep apnea Mother   . Obesity Mother   . Hypertension Father   . Cancer Father        kidney  . Depression Father   . Hyperlipidemia Father   . Kidney cancer Father   . Alcoholism Father   . Obesity Father   . Cancer  Maternal Uncle        stomach  . Hypertension Maternal Grandmother   . Cancer Maternal Grandmother        kidney, bone  . Hypertension Maternal Grandfather   . Cancer Maternal Grandfather        lung, bone  . Stroke Maternal Grandfather   . Hypertension Paternal Grandmother   . Hypertension Paternal Grandfather   . Stroke Paternal Grandfather     Social History   Tobacco Use  . Smoking status: Never Smoker  . Smokeless tobacco: Never Used  Substance Use Topics  . Alcohol use: No  . Drug use: No    ROS   Objective:   Vitals: BP 114/79 (BP Location: Left Arm)   Pulse (!) 121   Temp 99.3 F (37.4 C) (Oral)   Resp (!) 24   LMP 12/14/2018 (Approximate)   SpO2 92%   Physical Exam Constitutional:      General: She is not in acute distress.    Appearance: Normal appearance. She is well-developed. She is not ill-appearing, toxic-appearing or diaphoretic.  HENT:     Head: Normocephalic and atraumatic.     Nose: Nose normal.     Mouth/Throat:     Mouth: Mucous membranes are moist.  Eyes:     Extraocular Movements: Extraocular movements intact.     Pupils: Pupils are equal, round, and reactive to light.  Cardiovascular:     Rate and Rhythm: Normal rate and regular rhythm.     Pulses: Normal pulses.     Heart sounds: Normal heart sounds. No murmur. No friction rub. No gallop.   Pulmonary:     Effort: Pulmonary effort is normal. No respiratory distress.     Breath sounds: Normal breath sounds. No stridor. No wheezing, rhonchi or rales.  Skin:    General: Skin is warm and dry.     Findings: No rash.  Neurological:     Mental Status: She is alert and oriented to person, place, and time.  Psychiatric:        Mood and Affect: Mood normal.        Behavior: Behavior normal.        Thought Content: Thought content normal.    Dg Chest 2 View  Result Date: 12/28/2018 CLINICAL DATA:  Shortness of breath and tachycardia EXAM: CHEST - 2 VIEW COMPARISON:  07/29/2006  FINDINGS: The heart size and mediastinal contours are within normal limits. Minimal atelectasis or scar at the lingula. The visualized skeletal structures are unremarkable. IMPRESSION: No active cardiopulmonary disease. Electronically Signed   By: Jasmine PangKim  Fujinaga M.D.   On: 12/28/2018 17:38  ED ECG REPORT   Date: 12/28/2018  Rate: 113 bpm  Rhythm: sinus tachycardia  QRS Axis: normal  Intervals: normal  ST/T Wave abnormalities: nonspecific T wave changes  Conduction Disutrbances:none  Narrative Interpretation: Sinus tachycardia 113 bpm.  There is T wave flattening in lead III and T wave inversion in V1.  EKG very comparable to previous one from 11/01/2018.  Old EKG Reviewed: unchanged  I have personally reviewed the EKG tracing and agree with the computerized printout as noted.   Assessment and Plan :   1. Shortness of breath   2. Cough   3. Atypical chest pain   4. Hypoxemia   5. Sinus tachycardia   6. Morbid obesity ([place])     Plan redirected patient to the emergency room as I am concerned she may be having a chest PE given shortness of breath, hypoxemia and tachycardia with no improvement from use of an albuterol inhaler and a negative Covid test.  Patient is agreeable with this plan and contracted for safety, will report to the ER now.   Jaynee Eagles, PA-C 12/28/18 1751

## 2018-12-28 NOTE — ED Triage Notes (Signed)
Pt sent from [facility]>  C/o SOB, tightness to center of chest, non-productive cough, chills, generalized body aches, sore throat, and fatigue since Saturday.  Negative COVID test on Saturday.  Reports O2 sats 92% at [facility] and HR 120s.  Fever on Saturday that resolved.

## 2018-12-29 NOTE — Telephone Encounter (Signed)
Temazepam last rx 10/20/18 #30 1 RF LOV: 11/15/18

## 2018-12-30 LAB — NOVEL CORONAVIRUS, NAA (HOSP ORDER, SEND-OUT TO REF LAB; TAT 18-24 HRS): SARS-CoV-2, NAA: NOT DETECTED

## 2019-01-05 ENCOUNTER — Encounter (INDEPENDENT_AMBULATORY_CARE_PROVIDER_SITE_OTHER): Payer: Self-pay | Admitting: Family Medicine

## 2019-01-05 ENCOUNTER — Other Ambulatory Visit: Payer: Self-pay

## 2019-01-05 ENCOUNTER — Ambulatory Visit (INDEPENDENT_AMBULATORY_CARE_PROVIDER_SITE_OTHER): Payer: BC Managed Care – PPO | Admitting: Family Medicine

## 2019-01-05 VITALS — BP 115/75 | HR 101 | Temp 98.6°F | Ht 65.0 in | Wt 277.0 lb

## 2019-01-05 DIAGNOSIS — Z6841 Body Mass Index (BMI) 40.0 and over, adult: Secondary | ICD-10-CM | POA: Diagnosis not present

## 2019-01-05 DIAGNOSIS — F3289 Other specified depressive episodes: Secondary | ICD-10-CM

## 2019-01-05 DIAGNOSIS — Z9189 Other specified personal risk factors, not elsewhere classified: Secondary | ICD-10-CM

## 2019-01-05 MED ORDER — BUPROPION HCL ER (SR) 150 MG PO TB12: 150.0000 mg | ORAL_TABLET | Freq: Every day | ORAL | 0 refills | Status: DC

## 2019-01-09 DIAGNOSIS — F411 Generalized anxiety disorder: Secondary | ICD-10-CM | POA: Diagnosis not present

## 2019-01-09 NOTE — Progress Notes (Signed)
Office: [phone]  /  Fax: [phone]   HPI:  Chief Complaint: OBESITY [name] is here to discuss her progress with her obesity treatment plan. She is on the Category 3 plan and states she is following her eating plan approximately 40-50 % of the time. She states she is exercising 0 minutes 0 times per week.  [name] continues to do well with weight loss. She had to reschedule some visits since her last one, but she still worked to follow her Category 3 plan. She has been deviating some, but she is still trying to eat her lean protein.  Today's visit was # 3  Starting weight: 282 lbs Starting date: 11/01/2018 Today's weight : 277 lbs Today's date: 01/05/2019 Total lbs lost to date: 5 Total lbs lost since last in-office visit: 2  Depression with Emotional Eating Behaviors [name] started Wellbutrin last visit and she is doing well. She feels she is doing better with emotional eating and her mood. She denies worsening insomnia.  At risk for cardiovascular disease [name] is at a higher than average risk for cardiovascular disease due to obesity. She currently denies any chest pain.  ASSESSMENT AND PLAN:  Other depression - with emotional eating - Plan: buPROPion (WELLBUTRIN SR) 150 MG 12 hr tablet  At risk for heart disease  Class 3 severe obesity with serious comorbidity and body mass index (BMI) of 45.0 to 49.9 in adult, unspecified obesity type (HCC)  PLAN:  Emotional Eating Behaviors (other depression) Behavior modification techniques were discussed today to help [name] deal with her emotional/non-hunger eating behaviors. [name] agrees to continue taking Wellbutrin SR 150 mg PO daily #30 and we will refill for 1 month, and she will continue taking Celexa. [name] agrees to follow up with our clinic in 2 weeks.  Cardiovascular risk counseling [name] was given (~15 minutes) coronary artery disease prevention counseling today. She is 33 y.o. female and has risk  factors for heart disease including obesity. We discussed intensive lifestyle modifications today with an emphasis on specific weight loss instructions and strategies.   Obesity [name] is currently in the action stage of change. As such, her goal is to continue with weight loss efforts She has agreed to follow the Category 3 plan Armya has been instructed to work up to a goal of 150 minutes of combined cardio and strengthening exercise per week for weight loss and overall health benefits. We discussed the following Behavioral Modification Strategies today: work on meal planning and easy cooking plans and holiday eating strategies    [name] has agreed to follow up with our clinic in 2 weeks. She was informed of the importance of frequent follow up visits to maximize her success with intensive lifestyle modifications for her multiple health conditions.  ALLERGIES: No Known Allergies  MEDICATIONS: Current Outpatient Medications on File Prior to Visit  Medication Sig Dispense Refill  . albuterol (VENTOLIN HFA) 108 (90 Base) MCG/ACT inhaler Inhale 1-2 puffs into the lungs every 6 (six) hours as needed for wheezing or shortness of breath.    Marland Kitchen amitriptyline (ELAVIL) 25 MG tablet TAKE 2 TABLETS BY MOUTH AT BEDTIME (Patient taking differently: Take 50 mg by mouth at bedtime. ) 180 tablet 1  . Biotin 5000 MCG CAPS Take 1 capsule by mouth daily.    . butalbital-acetaminophen-caffeine (FIORICET, ESGIC) 50-325-40 MG per tablet Take 1 tablet by mouth 2 (two) times daily as needed for headache.    . cetirizine (ZYRTEC) 10 MG tablet Take 10 mg by mouth daily.    Marland Kitchen  cimetidine (TAGAMET) 200 MG tablet Take 200 mg by mouth 2 (two) times daily.    Marland Kitchen dicyclomine (BENTYL) 10 MG capsule Take 10 mg by mouth as needed for spasms.    . fluticasone (FLONASE) 50 MCG/ACT nasal spray Place 2 sprays into both nostrils daily. 16 g 6  . Melatonin 10 MG TABS Take 10 mg by mouth at bedtime.     . Multiple Vitamin  (MULTIVITAMIN) tablet Take 1 tablet by mouth daily.    . Probiotic Product (PROBIOTIC-10 PO) Take 1 tablet by mouth 2 (two) times daily.     Marland Kitchen spironolactone (ALDACTONE) 100 MG tablet Take 100 mg by mouth daily.  4  . SPRINTEC 28 0.25-35 MG-MCG tablet Take 1 tablet by mouth daily.  4  . temazepam (RESTORIL) 15 MG capsule Take 1 capsule (15 mg total) by mouth at bedtime as needed for sleep. 30 capsule 1  . venlafaxine XR (EFFEXOR-XR) 150 MG 24 hr capsule TAKE ONE CAPSULE BY MOUTH EVERY MORNING WITH BREAKFAST (Patient taking differently: Take 150 mg by mouth daily with breakfast. ) 90 capsule 1  . SUMAtriptan (IMITREX) 50 MG tablet Take 1 tablet (50 mg total) by mouth once for 1 dose. May repeat in 2 hours if headache persists or recurs. 10 tablet 0   No current facility-administered medications on file prior to visit.    PAST MEDICAL HISTORY: Past Medical History:  Diagnosis Date  . Allergy   . Anxiety   . Back pain   . Constipation   . Depression   . Dyspnea   . Gallbladder problem   . GERD (gastroesophageal reflux disease)   . IBS (irritable bowel syndrome)   . Interstitial cystitis   . Lactose intolerance   . Migraine   . Overactive bladder   . Palpitations   . PCOS (polycystic ovarian syndrome)   . Pelvic pain   . Pseudotumor cerebri   . Pseudotumor cerebri   . SUI (stress urinary incontinence, female)   . Urinary incontinence     PAST SURGICAL HISTORY: Past Surgical History:  Procedure Laterality Date  . CESAREAN SECTION  2009  &  2011   2011 W/ BILATERAL TUBAL LIGATION  . CHOLECYSTECTOMY  2010  . CYSTO WITH HYDRODISTENSION  03/01/2012   Procedure: CYSTOSCOPY/HYDRODISTENSION;  Surgeon: Reece Packer, MD;  Location: [facility];  Service: Urology;  Laterality: N/A;  INSTILLATION of marcaine and pyridium.  . PUBOVAGINAL SLING N/A 02/14/2013   Procedure: PUBO-VAGINAL SLING AND HYDRODISTENSION;  Surgeon: Reece Packer, MD;  Location: [facility];  Service: Urology;  Laterality: N/A;  . TRANSTHORACIC ECHOCARDIOGRAM  06/2009  . TUBAL LIGATION      SOCIAL HISTORY: Social History   Tobacco Use  . Smoking status: Never Smoker  . Smokeless tobacco: Never Used  Substance Use Topics  . Alcohol use: No  . Drug use: No    FAMILY HISTORY: Family History  Problem Relation Age of Onset  . Hypertension Mother   . Depression Mother   . Diabetes Mother   . Anxiety disorder Mother   . Sleep apnea Mother   . Obesity Mother   . Hypertension Father   . Cancer Father        kidney  . Depression Father   . Hyperlipidemia Father   . Kidney cancer Father   . Alcoholism Father   . Obesity Father   . Cancer Maternal Uncle        stomach  .  Hypertension Maternal Grandmother   . Cancer Maternal Grandmother        kidney, bone  . Hypertension Maternal Grandfather   . Cancer Maternal Grandfather        lung, bone  . Stroke Maternal Grandfather   . Hypertension Paternal Grandmother   . Hypertension Paternal Grandfather   . Stroke Paternal Grandfather     ROS: Review of Systems  Constitutional: Positive for weight loss.  Cardiovascular: Negative for chest pain.  Psychiatric/Behavioral: Positive for depression. Negative for suicidal ideas. The patient does not have insomnia.     PHYSICAL EXAM: Blood pressure 115/75, pulse (!) 101, temperature 98.6 F (37 C), temperature source Oral, height 5\' 5"  (1.651 m), weight 277 lb (125.6 kg), last menstrual period 01/05/2019, SpO2 96 %. Body mass index is 46.1 kg/m. Physical Exam Vitals reviewed.  Constitutional:      Appearance: Normal appearance. She is obese.  Cardiovascular:     Rate and Rhythm: Normal rate.     Pulses: Normal pulses.  Pulmonary:     Effort: Pulmonary effort is normal.     Breath sounds: Normal breath sounds.  Musculoskeletal:        General: Normal range of motion.  Skin:    General: Skin is warm and dry.  Neurological:     Mental Status: She  is alert and oriented to person, place, and time.  Psychiatric:        Mood and Affect: Mood normal.        Behavior: Behavior normal.     RECENT LABS AND TESTS: BMET    Component Value Date/Time   NA 136 12/28/2018 1834   NA 137 11/01/2018 1030   K 4.3 12/28/2018 1834   CL 103 12/28/2018 1834   CO2 24 12/28/2018 1834   GLUCOSE 89 12/28/2018 1834   BUN 12 12/28/2018 1834   BUN 15 11/01/2018 1030   CREATININE 0.87 12/28/2018 1834   CALCIUM 9.3 12/28/2018 1834   GFRNONAA >60 12/28/2018 1834   GFRAA >60 12/28/2018 1834   Lab Results  Component Value Date   HGBA1C 5.5 11/01/2018   HGBA1C 5.5 07/06/2017   HGBA1C 5.7 05/05/2016   Lab Results  Component Value Date   INSULIN 23.9 11/01/2018   CBC    Component Value Date/Time   WBC 8.8 12/28/2018 1834   RBC 4.62 12/28/2018 1834   HGB 14.8 12/28/2018 1834   HGB 13.9 11/01/2018 1030   HCT 44.0 12/28/2018 1834   HCT 41.6 11/01/2018 1030   PLT 437 (H) 12/28/2018 1834   PLT 367 11/01/2018 1030   MCV 95.2 12/28/2018 1834   MCV 94 11/01/2018 1030   MCH 32.0 12/28/2018 1834   MCHC 33.6 12/28/2018 1834   RDW 12.9 12/28/2018 1834   RDW 13.0 11/01/2018 1030   LYMPHSABS 2.5 11/01/2018 1030   MONOABS 0.5 05/24/2018 1515   EOSABS 0.1 11/01/2018 1030   BASOSABS 0.1 11/01/2018 1030   Iron/TIBC/Ferritin/ %Sat No results found for: IRON, TIBC, FERRITIN, IRONPCTSAT Lipid Panel     Component Value Date/Time   CHOL 128 11/01/2018 1030   TRIG 86 11/01/2018 1030   HDL 58 11/01/2018 1030   CHOLHDL 2 05/05/2016 1019   VLDL 26.2 05/05/2016 1019   LDLCALC 53 11/01/2018 1030   Hepatic Function Panel     Component Value Date/Time   PROT 6.6 11/01/2018 1030   ALBUMIN 3.8 11/01/2018 1030   AST 17 11/01/2018 1030   ALT 20 11/01/2018 1030   ALKPHOS 54  11/01/2018 1030   BILITOT <0.2 11/01/2018 1030      Component Value Date/Time   TSH 1.840 12/28/2018 2141   TSH 1.500 11/01/2018 1030   TSH 1.33 07/12/2018 0000   TSH 3.24  07/19/2017 1013   TSH 4.08 07/06/2017 0000   TSH 1.25 05/05/2016 1019        I, Burt KnackSharon Jahni Paul, am acting as Energy managertranscriptionist for Quillian Quincearen Beasley, MD I have reviewed the above documentation for accuracy and completeness, and I agree with the above. -Quillian Quincearen Beasley, MD

## 2019-01-18 ENCOUNTER — Ambulatory Visit: Payer: BC Managed Care – PPO | Attending: Internal Medicine

## 2019-01-18 ENCOUNTER — Telehealth (INDEPENDENT_AMBULATORY_CARE_PROVIDER_SITE_OTHER): Payer: BC Managed Care – PPO | Admitting: Family Medicine

## 2019-01-18 ENCOUNTER — Encounter (INDEPENDENT_AMBULATORY_CARE_PROVIDER_SITE_OTHER): Payer: Self-pay | Admitting: Family Medicine

## 2019-01-18 ENCOUNTER — Other Ambulatory Visit: Payer: Self-pay

## 2019-01-18 DIAGNOSIS — R238 Other skin changes: Secondary | ICD-10-CM | POA: Diagnosis not present

## 2019-01-18 DIAGNOSIS — F3289 Other specified depressive episodes: Secondary | ICD-10-CM

## 2019-01-18 DIAGNOSIS — Z6841 Body Mass Index (BMI) 40.0 and over, adult: Secondary | ICD-10-CM

## 2019-01-18 DIAGNOSIS — U071 COVID-19: Secondary | ICD-10-CM | POA: Diagnosis not present

## 2019-01-19 LAB — NOVEL CORONAVIRUS, NAA: SARS-CoV-2, NAA: NOT DETECTED

## 2019-01-30 NOTE — Progress Notes (Signed)
Office: [phone]  /  Fax: [phone] TeleHealth Visit:  [name] has verbally consented to this TeleHealth visit today. The patient is located at home, the provider is located at the [employer] [facility] office. The participants in this visit include the listed provider and patient and any and all parties involved. The visit was conducted today via [email or web address].  HPI:  Chief Complaint: OBESITY [name] is here to discuss her progress with her obesity treatment plan. She is on the Category 3 Plan and states she is following her eating plan approximately 50 % of the time. She states she is walking 30 minutes 1 to 2 times per week.  [name] feels she has done well maintaining her weight since our last visit (weight not reported). She notes December is challenging, especially this year, but she also has had a few less temptations. She will not be having a larger Christmas meal and get together this year.  Emotional Eating [name] is doing well on Bupropion. She is working on decreasing comfort and stress eating, and she feels more in control.  ASSESSMENT AND PLAN:  Other depression, emotional eating  Class 3 severe obesity with serious comorbidity and body mass index (BMI) of 45.0 to 49.9 in adult, unspecified obesity type [facility])  PLAN:  Emotional Eating Behavior modification techniques were discussed today to help [name] deal with her emotional/non-hunger eating behaviors. [name] will continue Bupropion as is prescribed, and we will continue to monitor. Encouragement and supported was offered to patient. Orders and follow up as documented in patient record.   Time 3 We spent > 50% of the 12 minute visit in the counseling and coordination of care documented above.  Obesity [name] is currently in the action stage of change. As such, her goal is to continue with weight loss efforts. She has agreed to follow the Category 3 Plan. [name] has been instructed to work  up to a goal of 150 minutes of combined cardio and strengthening exercise per week for weight loss and overall health benefits. We discussed the following Behavioral Modification Strategies today: meal planning and cooking strategies, emotional eating strategies and holiday eating strategies .  [name] has agreed to follow-up with our clinic in 2 weeks. She was informed of the importance of frequent follow-up visits to maximize her success with intensive lifestyle modifications for her multiple health conditions.  ALLERGIES: No Known Allergies  MEDICATIONS: Current Outpatient Medications on File Prior to Visit  Medication Sig Dispense Refill  . albuterol (VENTOLIN HFA) 108 (90 Base) MCG/ACT inhaler Inhale 1-2 puffs into the lungs every 6 (six) hours as needed for wheezing or shortness of breath.    Marland Kitchen amitriptyline (ELAVIL) 25 MG tablet TAKE 2 TABLETS BY MOUTH AT BEDTIME (Patient taking differently: Take 50 mg by mouth at bedtime. ) 180 tablet 1  . Biotin 5000 MCG CAPS Take 1 capsule by mouth daily.    Marland Kitchen buPROPion (WELLBUTRIN SR) 150 MG 12 hr tablet Take 1 tablet (150 mg total) by mouth daily. 30 tablet 0  . butalbital-acetaminophen-caffeine (FIORICET, ESGIC) 50-325-40 MG per tablet Take 1 tablet by mouth 2 (two) times daily as needed for headache.    . cetirizine (ZYRTEC) 10 MG tablet Take 10 mg by mouth daily.    . cimetidine (TAGAMET) 200 MG tablet Take 200 mg by mouth 2 (two) times daily.    Marland Kitchen dicyclomine (BENTYL) 10 MG capsule Take 10 mg by mouth as needed for spasms.    . fluticasone (FLONASE) 50  MCG/ACT nasal spray Place 2 sprays into both nostrils daily. 16 g 6  . Melatonin 10 MG TABS Take 10 mg by mouth at bedtime.     . Multiple Vitamin (MULTIVITAMIN) tablet Take 1 tablet by mouth daily.    . Probiotic Product (PROBIOTIC-10 PO) Take 1 tablet by mouth 2 (two) times daily.     Marland Kitchen spironolactone (ALDACTONE) 100 MG tablet Take 100 mg by mouth daily.  4  . SPRINTEC 28 0.25-35 MG-MCG  tablet Take 1 tablet by mouth daily.  4  . temazepam (RESTORIL) 15 MG capsule Take 1 capsule (15 mg total) by mouth at bedtime as needed for sleep. 30 capsule 1  . venlafaxine XR (EFFEXOR-XR) 150 MG 24 hr capsule TAKE ONE CAPSULE BY MOUTH EVERY MORNING WITH BREAKFAST (Patient taking differently: Take 150 mg by mouth daily with breakfast. ) 90 capsule 1  . SUMAtriptan (IMITREX) 50 MG tablet Take 1 tablet (50 mg total) by mouth once for 1 dose. May repeat in 2 hours if headache persists or recurs. 10 tablet 0   No current facility-administered medications on file prior to visit.    PAST MEDICAL HISTORY: Past Medical History:  Diagnosis Date  . Allergy   . Anxiety   . Back pain   . Constipation   . Depression   . Dyspnea   . Gallbladder problem   . GERD (gastroesophageal reflux disease)   . IBS (irritable bowel syndrome)   . Interstitial cystitis   . Lactose intolerance   . Migraine   . Overactive bladder   . Palpitations   . PCOS (polycystic ovarian syndrome)   . Pelvic pain   . Pseudotumor cerebri   . Pseudotumor cerebri   . SUI (stress urinary incontinence, female)   . Urinary incontinence     PAST SURGICAL HISTORY: Past Surgical History:  Procedure Laterality Date  . CESAREAN SECTION  2009  &  2011   2011 W/ BILATERAL TUBAL LIGATION  . CHOLECYSTECTOMY  2010  . CYSTO WITH HYDRODISTENSION  03/01/2012   Procedure: CYSTOSCOPY/HYDRODISTENSION;  Surgeon: Martina Sinner, MD;  Location[facility];  Service: Urology;  Laterality: N/A;  INSTILLATION of marcaine and pyridium.  . PUBOVAGINAL SLING N/A 02/14/2013   Procedure: PUBO-VAGINAL SLING AND HYDRODISTENSION;  Surgeon: Martina Sinner, MD;  Location: [facility];  Service: Urology;  Laterality: N/A;  . TRANSTHORACIC ECHOCARDIOGRAM  06/2009  . TUBAL LIGATION      SOCIAL HISTORY: Social History   Tobacco Use  . Smoking status: Never Smoker  . Smokeless tobacco: Never Used  Substance Use  Topics  . Alcohol use: No  . Drug use: No    FAMILY HISTORY: Family History  Problem Relation Age of Onset  . Hypertension Mother   . Depression Mother   . Diabetes Mother   . Anxiety disorder Mother   . Sleep apnea Mother   . Obesity Mother   . Hypertension Father   . Cancer Father        kidney  . Depression Father   . Hyperlipidemia Father   . Kidney cancer Father   . Alcoholism Father   . Obesity Father   . Cancer Maternal Uncle        stomach  . Hypertension Maternal Grandmother   . Cancer Maternal Grandmother        kidney, bone  . Hypertension Maternal Grandfather   . Cancer Maternal Grandfather        lung, bone  .  Stroke Maternal Grandfather   . Hypertension Paternal Grandmother   . Hypertension Paternal Grandfather   . Stroke Paternal Grandfather     ROS: Review of Systems  Constitutional: Negative for weight loss.  Psychiatric/Behavioral: Positive for depression.    PHYSICAL EXAM: Last menstrual period 01/05/2019. There is no height or weight on file to calculate BMI. Physical Exam Vitals reviewed.  Constitutional:      General: She is not in acute distress.    Appearance: Normal appearance. She is well-developed. She is obese.  Cardiovascular:     Rate and Rhythm: Normal rate.  Pulmonary:     Effort: Pulmonary effort is normal.  Musculoskeletal:        General: Normal range of motion.  Skin:    General: Skin is warm and dry.  Neurological:     Mental Status: She is alert and oriented to person, place, and time.  Psychiatric:        Mood and Affect: Mood normal.        Behavior: Behavior normal.     RECENT LABS AND TESTS: BMET    Component Value Date/Time   NA 136 12/28/2018 1834   NA 137 11/01/2018 1030   K 4.3 12/28/2018 1834   CL 103 12/28/2018 1834   CO2 24 12/28/2018 1834   GLUCOSE 89 12/28/2018 1834   BUN 12 12/28/2018 1834   BUN 15 11/01/2018 1030   CREATININE 0.87 12/28/2018 1834   CALCIUM 9.3 12/28/2018 1834    GFRNONAA >60 12/28/2018 1834   GFRAA >60 12/28/2018 1834   Lab Results  Component Value Date   HGBA1C 5.5 11/01/2018   HGBA1C 5.5 07/06/2017   HGBA1C 5.7 05/05/2016   Lab Results  Component Value Date   INSULIN 23.9 11/01/2018   CBC    Component Value Date/Time   WBC 8.8 12/28/2018 1834   RBC 4.62 12/28/2018 1834   HGB 14.8 12/28/2018 1834   HGB 13.9 11/01/2018 1030   HCT 44.0 12/28/2018 1834   HCT 41.6 11/01/2018 1030   PLT 437 (H) 12/28/2018 1834   PLT 367 11/01/2018 1030   MCV 95.2 12/28/2018 1834   MCV 94 11/01/2018 1030   MCH 32.0 12/28/2018 1834   MCHC 33.6 12/28/2018 1834   RDW 12.9 12/28/2018 1834   RDW 13.0 11/01/2018 1030   LYMPHSABS 2.5 11/01/2018 1030   MONOABS 0.5 05/24/2018 1515   EOSABS 0.1 11/01/2018 1030   BASOSABS 0.1 11/01/2018 1030   Iron/TIBC/Ferritin/ %Sat No results found for: IRON, TIBC, FERRITIN, IRONPCTSAT Lipid Panel     Component Value Date/Time   CHOL 128 11/01/2018 1030   TRIG 86 11/01/2018 1030   HDL 58 11/01/2018 1030   CHOLHDL 2 05/05/2016 1019   VLDL 26.2 05/05/2016 1019   LDLCALC 53 11/01/2018 1030   Hepatic Function Panel     Component Value Date/Time   PROT 6.6 11/01/2018 1030   ALBUMIN 3.8 11/01/2018 1030   AST 17 11/01/2018 1030   ALT 20 11/01/2018 1030   ALKPHOS 54 11/01/2018 1030   BILITOT <0.2 11/01/2018 1030      Component Value Date/Time   TSH 1.840 12/28/2018 2141   TSH 1.500 11/01/2018 1030   TSH 1.33 07/12/2018 0000   TSH 3.24 07/19/2017 1013   TSH 4.08 07/06/2017 0000   TSH 1.25 05/05/2016 1019    Ref. Range 11/01/2018 10:30  Vitamin D, 25-Hydroxy Latest Ref Range: 30.0 - 100.0 ng/mL 78.6    I, [place] Crane, am acting as Energy manager for  Dennard Nip, MD I have reviewed the above documentation for accuracy and completeness, and I agree with the above. -Dennard Nip, MD

## 2019-01-31 ENCOUNTER — Ambulatory Visit (INDEPENDENT_AMBULATORY_CARE_PROVIDER_SITE_OTHER): Payer: BC Managed Care – PPO | Admitting: Family Medicine

## 2019-02-01 ENCOUNTER — Other Ambulatory Visit: Payer: Self-pay

## 2019-02-01 ENCOUNTER — Encounter (INDEPENDENT_AMBULATORY_CARE_PROVIDER_SITE_OTHER): Payer: Self-pay | Admitting: Family Medicine

## 2019-02-01 ENCOUNTER — Ambulatory Visit (INDEPENDENT_AMBULATORY_CARE_PROVIDER_SITE_OTHER): Payer: BC Managed Care – PPO | Admitting: Family Medicine

## 2019-02-01 VITALS — BP 135/85 | HR 95 | Temp 97.7°F | Ht 65.0 in | Wt 276.0 lb

## 2019-02-01 DIAGNOSIS — E559 Vitamin D deficiency, unspecified: Secondary | ICD-10-CM

## 2019-02-01 DIAGNOSIS — F3289 Other specified depressive episodes: Secondary | ICD-10-CM

## 2019-02-01 DIAGNOSIS — R5383 Other fatigue: Secondary | ICD-10-CM | POA: Diagnosis not present

## 2019-02-01 DIAGNOSIS — Z79899 Other long term (current) drug therapy: Secondary | ICD-10-CM

## 2019-02-01 DIAGNOSIS — Z6841 Body Mass Index (BMI) 40.0 and over, adult: Secondary | ICD-10-CM

## 2019-02-01 DIAGNOSIS — E88819 Insulin resistance, unspecified: Secondary | ICD-10-CM

## 2019-02-01 DIAGNOSIS — E8881 Metabolic syndrome: Secondary | ICD-10-CM

## 2019-02-01 DIAGNOSIS — Z9189 Other specified personal risk factors, not elsewhere classified: Secondary | ICD-10-CM | POA: Diagnosis not present

## 2019-02-02 ENCOUNTER — Other Ambulatory Visit (INDEPENDENT_AMBULATORY_CARE_PROVIDER_SITE_OTHER): Payer: Self-pay | Admitting: Family Medicine

## 2019-02-02 DIAGNOSIS — E8881 Metabolic syndrome: Secondary | ICD-10-CM | POA: Diagnosis not present

## 2019-02-02 DIAGNOSIS — E559 Vitamin D deficiency, unspecified: Secondary | ICD-10-CM | POA: Diagnosis not present

## 2019-02-02 DIAGNOSIS — F3289 Other specified depressive episodes: Secondary | ICD-10-CM

## 2019-02-02 DIAGNOSIS — R5383 Other fatigue: Secondary | ICD-10-CM | POA: Diagnosis not present

## 2019-02-02 MED ORDER — METFORMIN HCL 500 MG PO TABS: 500.0000 mg | ORAL_TABLET | Freq: Every day | ORAL | 0 refills | Status: DC

## 2019-02-03 ENCOUNTER — Encounter (INDEPENDENT_AMBULATORY_CARE_PROVIDER_SITE_OTHER): Payer: Self-pay | Admitting: Family Medicine

## 2019-02-03 LAB — CBC WITH DIFFERENTIAL/PLATELET
Basophils Absolute: 0.1 10*3/uL (ref 0.0–0.2)
Basos: 1 %
EOS (ABSOLUTE): 0.1 10*3/uL (ref 0.0–0.4)
Eos: 1 %
Hematocrit: 45.3 % (ref 34.0–46.6)
Hemoglobin: 14.3 g/dL (ref 11.1–15.9)
Immature Grans (Abs): 0 10*3/uL (ref 0.0–0.1)
Immature Granulocytes: 0 %
Lymphocytes Absolute: 2.9 10*3/uL (ref 0.7–3.1)
Lymphs: 32 %
MCH: 31.4 pg (ref 26.6–33.0)
MCHC: 31.6 g/dL (ref 31.5–35.7)
MCV: 100 fL — ABNORMAL HIGH (ref 79–97)
Monocytes Absolute: 0.4 10*3/uL (ref 0.1–0.9)
Monocytes: 4 %
Neutrophils Absolute: 5.6 10*3/uL (ref 1.4–7.0)
Neutrophils: 62 %
Platelets: 410 10*3/uL (ref 150–450)
RBC: 4.55 x10E6/uL (ref 3.77–5.28)
RDW: 12.7 % (ref 11.7–15.4)
WBC: 9 10*3/uL (ref 3.4–10.8)

## 2019-02-03 LAB — COMPREHENSIVE METABOLIC PANEL
ALT: 18 IU/L (ref 0–32)
AST: 14 IU/L (ref 0–40)
Albumin/Globulin Ratio: 1.4 (ref 1.2–2.2)
Albumin: 4 g/dL (ref 3.8–4.8)
Alkaline Phosphatase: 55 IU/L (ref 39–117)
BUN/Creatinine Ratio: 14 (ref 9–23)
BUN: 12 mg/dL (ref 6–20)
Bilirubin Total: <0.2 mg/dL (ref 0.0–1.2)
CO2: 21 mmol/L (ref 20–29)
Calcium: 9.2 mg/dL (ref 8.7–10.2)
Chloride: 103 mmol/L (ref 96–106)
Creatinine, Ser: 0.85 mg/dL (ref 0.57–1.00)
GFR calc Af Amer: 103 mL/min/{1.73_m2} (ref >59)
GFR calc non Af Amer: 89 mL/min/{1.73_m2} (ref >59)
Globulin, Total: 2.9 g/dL (ref 1.5–4.5)
Glucose: 97 mg/dL (ref 65–99)
Potassium: 4.5 mmol/L (ref 3.5–5.2)
Sodium: 139 mmol/L (ref 134–144)
Total Protein: 6.9 g/dL (ref 6.0–8.5)

## 2019-02-03 LAB — VITAMIN B12: Vitamin B-12: 552 pg/mL (ref 232–1245)

## 2019-02-03 LAB — VITAMIN D 25 HYDROXY (VIT D DEFICIENCY, FRACTURES): Vit D, 25-Hydroxy: 49 ng/mL (ref 30.0–100.0)

## 2019-02-03 LAB — LIPID PANEL WITH LDL/HDL RATIO
Cholesterol, Total: 129 mg/dL (ref 100–199)
HDL: 56 mg/dL (ref >39)
LDL Chol Calc (NIH): 53 mg/dL (ref 0–99)
LDL/HDL Ratio: 0.9 ratio (ref 0.0–3.2)
Triglycerides: 111 mg/dL (ref 0–149)
VLDL Cholesterol Cal: 20 mg/dL (ref 5–40)

## 2019-02-03 LAB — INSULIN, RANDOM: INSULIN: 25.3 u[IU]/mL — ABNORMAL HIGH (ref 2.6–24.9)

## 2019-02-03 LAB — T4, FREE: Free T4: 1.02 ng/dL (ref 0.82–1.77)

## 2019-02-03 LAB — TSH: TSH: 1.82 u[IU]/mL (ref 0.450–4.500)

## 2019-02-03 LAB — HEMOGLOBIN A1C
Est. average glucose Bld gHb Est-mCnc: 105 mg/dL
Hgb A1c MFr Bld: 5.3 % (ref 4.8–5.6)

## 2019-02-03 LAB — T3: T3, Total: 206 ng/dL — ABNORMAL HIGH (ref 71–180)

## 2019-02-04 NOTE — Progress Notes (Signed)
Chief Complaint:   Chief Complaint: OBESITY [name] is here to discuss her progress with her obesity treatment plan along with follow-up of her obesity related diagnoses. [name] is on the Category 3 Plan and states she is following her eating plan approximately 25% of the time. [name] states she is using the BowFlex for 10 minutes 1 time per week.  Today's visit was #: 5 Starting weight: 282 lbs Starting date: 11/01/2018 Today's weight: 276 lbs Today's date: 02/01/2019 Total lbs lost to date: 6 lbs Total lbs lost since last in-office visit: 1 lb  Interim History: [name] says she enjoyed the holiday off the plan.  She is ready to re-commit to the plan.  Subjective:   1. Insulin resistance [name] has a diagnosis of insulin resistance based on her elevated fasting insulin level >5. She continues to work on diet and exercise to decrease her risk of diabetes.  Lab Results  Component Value Date   INSULIN 25.3 (H) 02/02/2019   INSULIN 23.9 11/01/2018   2. Other fatigue Patient has been more fatigued lately. Sleeping normally. She denies dizziness, shortness of breath, heavy menstrual bleeding.   3. Vitamin D deficiency She's Vitamin D level was 78.6 on 11/01/2018. She is not currently taking vit D. She denies nausea, vomiting or muscle weakness.  4. Other depression, emotional eating [name] is struggling with emotional eating and using food for comfort to the extent that it is negatively impacting her health. She often snacks when she is not hungry. [name] sometimes feels she is out of control and then feels guilty that she made poor food choices. She has been working on behavior modification techniques to help reduce her emotional eating and has been unsuccessful. She shows no sign of suicidal or homicidal ideations.  Currently taking Wellbutrin and seeing Leodis Binet for counseling.  Assessment/Plan:   1. Insulin resistance [name] will continue to work on  weight loss, exercise, and decreasing simple carbohydrates to help decrease the risk of diabetes. [name] agreed to follow-up with [place] as directed to closely monitor her progress.  Orders - Comprehensive Metabolic Panel (CMET) - HgB A1c - Insulin, random - Lipid Panel With LDL/HDL Ratio - metFORMIN (GLUCOPHAGE) 500 MG tablet; Take 1 tablet (500 mg total) by mouth daily.  Dispense: 30 tablet; Refill: 0  2. Other fatigue [name] was informed fatigue may be related to obesity, depression or many other causes. Labs will be ordered, and in the meanwhile [name] has agreed to work on diet, exercise and weight loss.  Orders - T3 - T4, free - TSH - CBC w/Diff/Platelet - B12 - Lipid Panel With LDL/HDL Ratio  3. Vitamin D deficiency Low Vitamin D level contributes to fatigue and are associated with obesity, breast, and colon cancer.   Orders -Vitamin D (25 hydroxy)  4. Other depression, emotional eating Behavior modification techniques were discussed today to help [name] deal with her emotional/non-hunger eating behaviors.  Orders and follow up as documented in patient record.   5. At risk for diabetes mellitus [name] was given approximately 15 minutes of diabetes education and counseling today. We discussed intensive lifestyle modifications today with an emphasis on weight loss as well as increasing exercise and decreasing simple carbohydrates in her diet. We also reviewed medication options with an emphasis on risk versus benefit of those discussed.   6. Class 3 severe obesity with serious comorbidity and body mass index (BMI) of 45.0 to 49.9 in adult, unspecified obesity type (HCC) [name] is  currently in the action stage of change. As such, her goal is to continue with weight loss efforts. She has agreed to on the Category 3 Plan.   We discussed the following exercise goals today: For substantial health benefits, adults should do at least 150 minutes (2 hours and 30 minutes) a  week of moderate-intensity, or 75 minutes (1 hour and 15 minutes) a week of vigorous-intensity aerobic physical activity, or an equivalent combination of moderate- and vigorous-intensity aerobic activity. Aerobic activity should be performed in episodes of at least 10 minutes, and preferably, it should be spread throughout the week. Adults should also include muscle-strengthening activities that involve all major muscle groups on 2 or more days a week.  We discussed the following behavioral modification strategies today: increasing lean protein intake, decreasing simple carbohydrates, increasing vegetables and increasing water intake.  [name] has agreed to follow-up with our clinic in 2 weeks. She was informed of the importance of frequent follow-up visits to maximize her success with intensive lifestyle modifications for her multiple health conditions.   [name] was informed we would discuss her lab results at her next visit unless there is a critical issue that needs to be addressed sooner. [name] agreed to keep her next visit at the agreed upon time to discuss these results.  Objective:   Blood pressure 135/85, pulse 95, temperature 97.7 F (36.5 C), temperature source Oral, height 5\' 5"  (1.651 m), weight 276 lb (125.2 kg), last menstrual period 01/05/2019, SpO2 96 %. Body mass index is 45.93 kg/m.  General: Cooperative, alert, well developed, in no acute distress. HEENT: Conjunctivae and lids unremarkable. Neck: No thyromegaly.  Cardiovascular: Regular rhythm.  Lungs: Normal work of breathing. Extremities: No edema.  Neurologic: No focal deficits.   Lab Results  Component Value Date   CREATININE 0.85 02/02/2019   BUN 12 02/02/2019   NA 139 02/02/2019   K 4.5 02/02/2019   CL 103 02/02/2019   CO2 21 02/02/2019   Lab Results  Component Value Date   ALT 18 02/02/2019   AST 14 02/02/2019   ALKPHOS 55 02/02/2019   BILITOT <0.2 02/02/2019   Lab Results  Component  Value Date   HGBA1C 5.3 02/02/2019   HGBA1C 5.5 11/01/2018   HGBA1C 5.5 07/06/2017   HGBA1C 5.7 05/05/2016   Lab Results  Component Value Date   INSULIN 25.3 (H) 02/02/2019   INSULIN 23.9 11/01/2018   Lab Results  Component Value Date   TSH 1.820 02/02/2019   Lab Results  Component Value Date   CHOL 129 02/02/2019   HDL 56 02/02/2019   LDLCALC 53 02/02/2019   TRIG 111 02/02/2019   CHOLHDL 2 05/05/2016   Lab Results  Component Value Date   WBC 9.0 02/02/2019   HGB 14.3 02/02/2019   HCT 45.3 02/02/2019   MCV 100 (H) 02/02/2019   PLT 410 02/02/2019   Attestation Statements:   Reviewed by clinician on day of visit: allergies, medications, problem list, medical history, surgical history, family history, social history, and previous encounter notes.  I, Water quality scientist, CMA, am acting as Location manager for [employer], DO.  I have reviewed the above documentation for accuracy and completeness, and I agree with the above. Briscoe Deutscher, DO

## 2019-02-06 NOTE — Telephone Encounter (Signed)
Please advise 

## 2019-02-08 ENCOUNTER — Encounter (INDEPENDENT_AMBULATORY_CARE_PROVIDER_SITE_OTHER): Payer: Self-pay | Admitting: Family Medicine

## 2019-02-12 ENCOUNTER — Telehealth: Payer: BC Managed Care – PPO | Admitting: Physician Assistant

## 2019-02-12 DIAGNOSIS — L03119 Cellulitis of unspecified part of limb: Secondary | ICD-10-CM

## 2019-02-12 MED ORDER — CEPHALEXIN 500 MG PO CAPS: 500.0000 mg | ORAL_CAPSULE | Freq: Two times a day (BID) | ORAL | 0 refills | Status: DC

## 2019-02-12 MED ORDER — CEPHALEXIN 500 MG PO CAPS: 500.0000 mg | ORAL_CAPSULE | Freq: Two times a day (BID) | ORAL | 0 refills | Status: AC

## 2019-02-12 NOTE — Progress Notes (Signed)
E Visit for Cellulitis  We are sorry that you are not feeling well. Here is how we plan to help!  Based on what you shared with me it looks like you have cellulitis.  Cellulitis looks like areas of skin redness, swelling, and warmth; it develops as a result of bacteria entering under the skin. Little red spots and/or bleeding can be seen in skin, and tiny surface sacs containing fluid can occur. Fever can be present. Cellulitis is almost always on one side of a body, and the lower limbs are the most common site of involvement.   I have prescribed:  Keflex 500mg take one by mouth four times a day for 5 days  HOME CARE:  Take your medications as ordered and take all of them, even if the skin irritation appears to be healing.   GET HELP RIGHT AWAY IF:  Symptoms that don't begin to go away within 48 hours. Severe redness persists or worsens If the area turns color, spreads or swells. If it blisters and opens, develops yellow-brown crust or bleeds. You develop a fever or chills. If the pain increases or becomes unbearable.  Are unable to keep fluids and food down.  MAKE SURE YOU   Understand these instructions. Will watch your condition. Will get help right away if you are not doing well or get worse.  Thank you for choosing an e-visit.  Your e-visit answers were reviewed by a board certified advanced clinical practitioner to complete your personal care plan. Depending upon the condition, your plan could have included both over the counter or prescription medications.  Please review your pharmacy choice. Make sure the pharmacy is open so you can pick up prescription now. If there is a problem, you may contact your provider through MyChart messaging and have the prescription routed to another pharmacy.  Your safety is important to us. If you have drug allergies check your prescription carefully.   For the next 24 hours you can use MyChart to ask questions about today's visit, request a  non-urgent call back, or ask for a work or school excuse. You will get an email in the next two days asking about your experience. I hope that your e-visit has been valuable and will speed your recovery.  

## 2019-02-12 NOTE — Addendum Note (Signed)
Addended by: Waldon Merl on: 02/12/2019 07:10 PM   Modules accepted: Orders

## 2019-02-12 NOTE — Progress Notes (Signed)
I have spent 5 minutes in review of e-visit questionnaire, review and updating patient chart, medical decision making and response to patient.   Eyoel Throgmorton Cody Koen [place], PA-C    

## 2019-02-13 ENCOUNTER — Other Ambulatory Visit: Payer: BC Managed Care – PPO

## 2019-02-15 ENCOUNTER — Ambulatory Visit (INDEPENDENT_AMBULATORY_CARE_PROVIDER_SITE_OTHER): Payer: BC Managed Care – PPO | Admitting: Family Medicine

## 2019-02-15 ENCOUNTER — Other Ambulatory Visit: Payer: Self-pay

## 2019-02-15 ENCOUNTER — Encounter (INDEPENDENT_AMBULATORY_CARE_PROVIDER_SITE_OTHER): Payer: Self-pay | Admitting: Family Medicine

## 2019-02-15 VITALS — BP 115/79 | HR 79 | Temp 98.5°F | Ht 65.0 in | Wt 275.0 lb

## 2019-02-15 DIAGNOSIS — E8881 Metabolic syndrome: Secondary | ICD-10-CM

## 2019-02-15 DIAGNOSIS — F3289 Other specified depressive episodes: Secondary | ICD-10-CM

## 2019-02-15 DIAGNOSIS — E559 Vitamin D deficiency, unspecified: Secondary | ICD-10-CM | POA: Diagnosis not present

## 2019-02-15 DIAGNOSIS — R718 Other abnormality of red blood cells: Secondary | ICD-10-CM | POA: Diagnosis not present

## 2019-02-15 DIAGNOSIS — Z6841 Body Mass Index (BMI) 40.0 and over, adult: Secondary | ICD-10-CM

## 2019-02-15 DIAGNOSIS — Z9189 Other specified personal risk factors, not elsewhere classified: Secondary | ICD-10-CM

## 2019-02-15 DIAGNOSIS — F411 Generalized anxiety disorder: Secondary | ICD-10-CM | POA: Diagnosis not present

## 2019-02-15 NOTE — Progress Notes (Signed)
Chief Complaint:   OBESITY [name] is here to discuss her progress with her obesity treatment plan along with follow-up of her obesity related diagnoses. [name] is on the Category 3 Plan and states she is following her eating plan approximately 50-60% of the time. [name] states she is exercising for 0 minutes 0 times per week.  Today's visit was #: 6 Starting weight: 282 lbs Starting date: 11/01/2018 Today's weight: 275 lbs Today's date: 02/15/2019 Total lbs lost to date: 7 lbs Total lbs lost since last in-office visit: 1 lb  Interim History: [name] is bored with the lunch options.  She is also having issues with alternating between constipation and diarrhea.   Subjective:   1. Insulin resistance [name] has a diagnosis of insulin resistance based on her elevated fasting insulin level >5. She continues to work on diet and exercise to decrease her risk of diabetes.  She is taking metformin 500 mg daily.  Lab Results  Component Value Date   INSULIN 25.3 (H) 02/02/2019   INSULIN 23.9 11/01/2018   Lab Results  Component Value Date   HGBA1C 5.3 02/02/2019   2. Vitamin D deficiency [name]'s Vitamin D level was 49.0 on 02/02/2019. She is currently taking vit D. She denies nausea, vomiting or muscle weakness.  3. Elevated MCV [name]'s vitamin B12 level is normal at 552.  4. Other depression [name] is struggling with emotional eating and using food for comfort to the extent that it is negatively impacting her health. She has been working on behavior modification techniques to help reduce her emotional eating and has been unsuccessful. She shows no sign of suicidal or homicidal ideations.  She is taking Wellbutrin 150 XL daily.  5. At risk for constipation [name] is at increased risk for constipation due to inadequate water intake, changes in diet, and/or use of medications. [name] denies hard, infrequent stools currently.   Assessment/Plan:   1. Insulin  resistance Arelis will continue to work on weight loss, exercise, and decreasing simple carbohydrates to help decrease the risk of diabetes. Yeng agreed to follow-up with [place] as directed to closely monitor her progress.  Orders - metFORMIN (GLUCOPHAGE) 500 MG tablet; Take 1 tablet (500 mg total) by mouth daily.  Dispense: 30 tablet; Refill: 0  2. Vitamin D deficiency Low Vitamin D level contributes to fatigue and are associated with obesity, breast, and colon cancer. She agrees to increase her vitamin D to 4,000 IU every day and will follow-up for routine testing of Vitamin D, at least 2-3 times per year to avoid over-replacement.  3. Elevated MCV Will follow.  4. Other depression Behavior modification techniques were discussed today to help [name] deal with her emotional/non-hunger eating behaviors.  Orders and follow up as documented in patient record.   Orders - buPROPion (WELLBUTRIN SR) 150 MG 12 hr tablet; Take 1 tablet (150 mg total) by mouth daily.  Dispense: 30 tablet; Refill: 0  5. At risk for constipation [name] was given approximately 15 minutes of counseling today regarding prevention of constipation. She was encouraged to increase water and fiber intake.   Counseling Getting to Good Bowel Health: Your goal is to have one soft bowel movement each day. Drink at least 8 glasses of water each day. Eat plenty of fiber (goal is over 25 grams each day). It is best to get most of your fiber from dietary sources which includes leafy green vegetables, fresh fruit, and whole grains. You may need to add fiber with the help  of OTC fiber supplements. These include Metamucil, Citrucel, and Flaxseed. If you are still having trouble, try adding Miralax or Magnesium Citrate. If all of these changes do not work, Cabin crew.  6. Class 3 severe obesity with serious comorbidity and body mass index (BMI) of 45.0 to 49.9 in adult, unspecified obesity type (HCC) [name] is  currently in the action stage of change. As such, her goal is to continue with weight loss efforts. She has agreed to the Category 3 Plan plus 350-500 calories and 35 + grams of protein at lunch.  Exercise goals: For substantial health benefits, adults should do at least 150 minutes (2 hours and 30 minutes) a week of moderate-intensity, or 75 minutes (1 hour and 15 minutes) a week of vigorous-intensity aerobic physical activity, or an equivalent combination of moderate- and vigorous-intensity aerobic activity. Aerobic activity should be performed in episodes of at least 10 minutes, and preferably, it should be spread throughout the week. Adults should also include muscle-strengthening activities that involve all major muscle groups on 2 or more days a week.  Behavioral modification strategies: increasing water intake and planning for success.  [name] has agreed to follow-up with our clinic in 2 weeks. She was informed of the importance of frequent follow-up visits to maximize her success with intensive lifestyle modifications for her multiple health conditions.   Objective:   Blood pressure 115/79, pulse 79, temperature 98.5 F (36.9 C), height 5\' 5"  (1.651 m), weight 275 lb (124.7 kg), SpO2 95 %. Body mass index is 45.76 kg/m.  General: Cooperative, alert, well developed, in no acute distress. HEENT: Conjunctivae and lids unremarkable. Cardiovascular: Regular rhythm.  Lungs: Normal work of breathing. Neurologic: No focal deficits.   Lab Results  Component Value Date   CREATININE 0.85 02/02/2019   BUN 12 02/02/2019   NA 139 02/02/2019   K 4.5 02/02/2019   CL 103 02/02/2019   CO2 21 02/02/2019   Lab Results  Component Value Date   ALT 18 02/02/2019   AST 14 02/02/2019   ALKPHOS 55 02/02/2019   BILITOT <0.2 02/02/2019   Lab Results  Component Value Date   HGBA1C 5.3 02/02/2019   HGBA1C 5.5 11/01/2018   HGBA1C 5.5 07/06/2017   HGBA1C 5.7 05/05/2016   Lab Results  Component  Value Date   INSULIN 25.3 (H) 02/02/2019   INSULIN 23.9 11/01/2018   Lab Results  Component Value Date   TSH 1.820 02/02/2019   Lab Results  Component Value Date   CHOL 129 02/02/2019   HDL 56 02/02/2019   LDLCALC 53 02/02/2019   TRIG 111 02/02/2019   CHOLHDL 2 05/05/2016   Lab Results  Component Value Date   WBC 9.0 02/02/2019   HGB 14.3 02/02/2019   HCT 45.3 02/02/2019   MCV 100 (H) 02/02/2019   PLT 410 02/02/2019   Attestation Statements:   Reviewed by clinician on day of visit: allergies, medications, problem list, medical history, surgical history, family history, social history, and previous encounter notes.  I, Water quality scientist, CMA, am acting as Location manager for [employer], DO.  I have reviewed the above documentation for accuracy and completeness, and I agree with the above. Briscoe Deutscher, DO

## 2019-02-19 ENCOUNTER — Encounter (INDEPENDENT_AMBULATORY_CARE_PROVIDER_SITE_OTHER): Payer: Self-pay | Admitting: Family Medicine

## 2019-02-19 MED ORDER — METFORMIN HCL 500 MG PO TABS: 500.0000 mg | ORAL_TABLET | Freq: Every day | ORAL | 0 refills | Status: DC

## 2019-02-19 MED ORDER — BUPROPION HCL ER (SR) 150 MG PO TB12: 150.0000 mg | ORAL_TABLET | Freq: Every day | ORAL | 0 refills | Status: DC

## 2019-02-21 ENCOUNTER — Encounter (INDEPENDENT_AMBULATORY_CARE_PROVIDER_SITE_OTHER): Payer: Self-pay | Admitting: Family Medicine

## 2019-02-21 ENCOUNTER — Other Ambulatory Visit (INDEPENDENT_AMBULATORY_CARE_PROVIDER_SITE_OTHER): Payer: Self-pay

## 2019-02-21 DIAGNOSIS — F3289 Other specified depressive episodes: Secondary | ICD-10-CM

## 2019-02-21 MED ORDER — BUPROPION HCL ER (XL) 150 MG PO TB24: 150.0000 mg | ORAL_TABLET | Freq: Every day | ORAL | 0 refills | Status: DC

## 2019-03-01 ENCOUNTER — Ambulatory Visit (INDEPENDENT_AMBULATORY_CARE_PROVIDER_SITE_OTHER): Payer: BC Managed Care – PPO | Admitting: Family Medicine

## 2019-03-01 ENCOUNTER — Encounter (INDEPENDENT_AMBULATORY_CARE_PROVIDER_SITE_OTHER): Payer: Self-pay | Admitting: Family Medicine

## 2019-03-01 ENCOUNTER — Other Ambulatory Visit: Payer: Self-pay

## 2019-03-01 VITALS — BP 116/75 | HR 77 | Temp 98.2°F | Ht 65.0 in | Wt 274.0 lb

## 2019-03-01 DIAGNOSIS — E559 Vitamin D deficiency, unspecified: Secondary | ICD-10-CM

## 2019-03-01 DIAGNOSIS — F3289 Other specified depressive episodes: Secondary | ICD-10-CM

## 2019-03-01 DIAGNOSIS — E8881 Metabolic syndrome: Secondary | ICD-10-CM | POA: Diagnosis not present

## 2019-03-01 DIAGNOSIS — Z9189 Other specified personal risk factors, not elsewhere classified: Secondary | ICD-10-CM | POA: Diagnosis not present

## 2019-03-01 DIAGNOSIS — Z6841 Body Mass Index (BMI) 40.0 and over, adult: Secondary | ICD-10-CM

## 2019-03-02 ENCOUNTER — Encounter (INDEPENDENT_AMBULATORY_CARE_PROVIDER_SITE_OTHER): Payer: Self-pay | Admitting: Family Medicine

## 2019-03-02 NOTE — Progress Notes (Signed)
Chief Complaint:   OBESITY [name] is here to discuss her progress with her obesity treatment plan along with follow-up of her obesity related diagnoses. [name] is on the Category 3 Plan and states she is following her eating plan approximately 75% of the time. Jodene states she is exercising for 0 minutes 0 times per week.  Today's visit was #: 7 Starting weight: 282 lbs Starting date: 11/01/2018 Today's weight: 274 lbs Today's date: 03/02/2019 Total lbs lost to date: 8 lbs Total lbs lost since last in-office visit: 1 lb  Interim History: [name] is happy with the choices that she has made.  She wants to lose more so that she can exercise without knee pain.  Subjective:   1. Insulin resistance [name] has a diagnosis of insulin resistance based on her elevated fasting insulin level >5. She continues to work on diet and exercise to decrease her risk of diabetes.  She is taking metformin 500 mg daily.  Lab Results  Component Value Date   INSULIN 25.3 (H) 02/02/2019   INSULIN 23.9 11/01/2018   Lab Results  Component Value Date   HGBA1C 5.3 02/02/2019   2. Vitamin D deficiency [name]'s Vitamin D level was 49.0 on 02/02/2019. She is currently taking vit D. She denies nausea, vomiting or muscle weakness.  3. Other depression, with emotional eating [name] is struggling with emotional eating and using food for comfort to the extent that it is negatively impacting her health. She has been working on behavior modification techniques to help reduce her emotional eating and has been unsuccessful. She shows no sign of suicidal or homicidal ideations.  She says that Wellbutrin gives her more energy.  4. At risk for diabetes mellitus [name] is at higher than average risk for developing diabetes due to her obesity.   Assessment/Plan:   1. Insulin resistance [name] will continue to work on weight loss, exercise, and decreasing simple carbohydrates to help decrease the risk  of diabetes. [name] agreed to follow-up with [place] as directed to closely monitor her progress.  2. Vitamin D deficiency Low Vitamin D level contributes to fatigue and are associated with obesity, breast, and colon cancer. She agrees to continue to take prescription Vitamin D @50 ,000 IU every week and will follow-up for routine testing of Vitamin D, at least 2-3 times per year to avoid over-replacement.  3. Other depression, with emotional eating Behavior modification techniques were discussed today to help [name] deal with her emotional/non-hunger eating behaviors.  Orders and follow up as documented in patient record.   4. At risk for diabetes mellitus [name] was given approximately 15 minutes of diabetes education and counseling today. We discussed intensive lifestyle modifications today with an emphasis on weight loss as well as increasing exercise and decreasing simple carbohydrates in her diet. We also reviewed medication options with an emphasis on risk versus benefit of those discussed.   Repetitive spaced learning was employed today to elicit superior memory formation and behavioral change.  5. Class 3 severe obesity with serious comorbidity and body mass index (BMI) of 45.0 to 49.9 in adult, unspecified obesity type (HCC) [name] is currently in the action stage of change. As such, her goal is to continue with weight loss efforts. She has agreed to the Category 3 Plan.   Exercise goals: For substantial health benefits, adults should do at least 150 minutes (2 hours and 30 minutes) a week of moderate-intensity, or 75 minutes (1 hour and 15 minutes) a week of vigorous-intensity aerobic  physical activity, or an equivalent combination of moderate- and vigorous-intensity aerobic activity. Aerobic activity should be performed in episodes of at least 10 minutes, and preferably, it should be spread throughout the week. Adults should also include muscle-strengthening activities that involve all  major muscle groups on 2 or more days a week.  Behavioral modification strategies: increasing lean protein intake and increasing water intake.  [name] has agreed to follow-up with our clinic in 2 weeks. She was informed of the importance of frequent follow-up visits to maximize her success with intensive lifestyle modifications for her multiple health conditions.   Objective:   Blood pressure 116/75, pulse 77, temperature 98.2 F (36.8 C), temperature source Oral, height 5\' 5"  (1.651 m), weight 274 lb (124.3 kg), last menstrual period 01/29/2019, SpO2 95 %. Body mass index is 45.6 kg/m.  General: Cooperative, alert, well developed, in no acute distress. HEENT: Conjunctivae and lids unremarkable. Cardiovascular: Regular rhythm.  Lungs: Normal work of breathing. Neurologic: No focal deficits.   Lab Results  Component Value Date   CREATININE 0.85 02/02/2019   BUN 12 02/02/2019   NA 139 02/02/2019   K 4.5 02/02/2019   CL 103 02/02/2019   CO2 21 02/02/2019   Lab Results  Component Value Date   ALT 18 02/02/2019   AST 14 02/02/2019   ALKPHOS 55 02/02/2019   BILITOT <0.2 02/02/2019   Lab Results  Component Value Date   HGBA1C 5.3 02/02/2019   HGBA1C 5.5 11/01/2018   HGBA1C 5.5 07/06/2017   HGBA1C 5.7 05/05/2016   Lab Results  Component Value Date   INSULIN 25.3 (H) 02/02/2019   INSULIN 23.9 11/01/2018   Lab Results  Component Value Date   TSH 1.820 02/02/2019   Lab Results  Component Value Date   CHOL 129 02/02/2019   HDL 56 02/02/2019   LDLCALC 53 02/02/2019   TRIG 111 02/02/2019   CHOLHDL 2 05/05/2016   Lab Results  Component Value Date   WBC 9.0 02/02/2019   HGB 14.3 02/02/2019   HCT 45.3 02/02/2019   MCV 100 (H) 02/02/2019   PLT 410 02/02/2019   Attestation Statements:   Reviewed by clinician on day of visit: allergies, medications, problem list, medical history, surgical history, family history, social history, and previous encounter notes.  I,  Water quality scientist, CMA, am acting as Location manager for [employer], DO.  I have reviewed the above documentation for accuracy and completeness, and I agree with the above. Briscoe Deutscher, DO

## 2019-03-04 ENCOUNTER — Telehealth: Payer: BC Managed Care – PPO | Admitting: Emergency Medicine

## 2019-03-04 DIAGNOSIS — T8090XA Unspecified complication following infusion and therapeutic injection, initial encounter: Secondary | ICD-10-CM | POA: Diagnosis not present

## 2019-03-04 MED ORDER — CEPHALEXIN 500 MG PO CAPS: 500.0000 mg | ORAL_CAPSULE | Freq: Two times a day (BID) | ORAL | 0 refills | Status: DC

## 2019-03-04 NOTE — Progress Notes (Signed)
E Visit for Cellulitis  We are sorry that you are not feeling well. Here is how we plan to help!   I think it is more likely to be a local injection site reaction, especially if you got it after the first COVID shot.  I think it would be pretty unlikely that you'd get cellulitis after each shot.  I would recommend taking some benadryl and treating it with warm compresses.  I have called in another round of Keflex for you, but I'd suggest doing the benadryl and warm compresses for the next day or so.  If the redness worsens, or you run a fever, then you can go ahead and start the antibiotic.   I have prescribed:  Keflex 500mg  take one by mouth four times a day for 5 days  HOME CARE:  . Take your medications as ordered and take all of them, even if the skin irritation appears to be healing.   GET HELP RIGHT AWAY IF:  . Symptoms that don't begin to go away within 48 hours. . Severe redness persists or worsens . If the area turns color, spreads or swells. . If it blisters and opens, develops yellow-brown crust or bleeds. . You develop a fever or chills. . If the pain increases or becomes unbearable.  . Are unable to keep fluids and food down.  MAKE SURE YOU    Understand these instructions.  Will watch your condition.  Will get help right away if you are not doing well or get worse.  Thank you for choosing an e-visit. Your e-visit answers were reviewed by a board certified advanced clinical practitioner to complete your personal care plan. Depending upon the condition, your plan could have included both over the counter or prescription medications. Please review your pharmacy choice. Make sure the pharmacy is open so you can pick up prescription now. If there is a problem, you may contact your provider through and have the prescription routed to another pharmacy. Your safety is important to [employer]. If you have drug allergies check your prescription carefully.  For the  next 24 hours you can use MyChart to ask questions about today's visit, request a non-urgent call back, or ask for a work or school excuse. You will get an email in the next two days asking about your experience. I hope that your e-visit has been valuable and will speed your recovery.  Greater than 5 minutes, yet less than 10 minutes was used in reviewing the patient's chart, questionnaire, prescribing medications, and documentation for this visit.

## 2019-03-10 ENCOUNTER — Encounter: Payer: Self-pay | Admitting: Physician Assistant

## 2019-03-10 MED ORDER — TEMAZEPAM 15 MG PO CAPS: 15.0000 mg | ORAL_CAPSULE | Freq: Every evening | ORAL | 1 refills | Status: DC | PRN

## 2019-03-10 NOTE — Telephone Encounter (Signed)
Temazepam last rx 12/29/18 #30 1 RF LOV: 02/12/19 E-visit LOV: 07/21/18 CPE.

## 2019-03-14 ENCOUNTER — Other Ambulatory Visit: Payer: Self-pay | Admitting: Emergency Medicine

## 2019-03-14 ENCOUNTER — Encounter: Payer: Self-pay | Admitting: Physician Assistant

## 2019-03-14 DIAGNOSIS — G4700 Insomnia, unspecified: Secondary | ICD-10-CM

## 2019-03-14 MED ORDER — AMITRIPTYLINE HCL 25 MG PO TABS: 50.0000 mg | ORAL_TABLET | Freq: Every day | ORAL | 1 refills | Status: DC

## 2019-03-15 ENCOUNTER — Encounter (INDEPENDENT_AMBULATORY_CARE_PROVIDER_SITE_OTHER): Payer: Self-pay

## 2019-03-15 ENCOUNTER — Ambulatory Visit (INDEPENDENT_AMBULATORY_CARE_PROVIDER_SITE_OTHER): Payer: BC Managed Care – PPO | Admitting: Family Medicine

## 2019-03-15 ENCOUNTER — Encounter (INDEPENDENT_AMBULATORY_CARE_PROVIDER_SITE_OTHER): Payer: Self-pay | Admitting: Family Medicine

## 2019-03-24 ENCOUNTER — Other Ambulatory Visit: Payer: Self-pay | Admitting: Physician Assistant

## 2019-03-24 DIAGNOSIS — G4700 Insomnia, unspecified: Secondary | ICD-10-CM

## 2019-03-27 ENCOUNTER — Encounter (INDEPENDENT_AMBULATORY_CARE_PROVIDER_SITE_OTHER): Payer: Self-pay | Admitting: Family Medicine

## 2019-03-28 ENCOUNTER — Ambulatory Visit: Payer: BC Managed Care – PPO | Attending: Internal Medicine

## 2019-03-28 DIAGNOSIS — Z20822 Contact with and (suspected) exposure to covid-19: Secondary | ICD-10-CM | POA: Diagnosis not present

## 2019-03-29 LAB — NOVEL CORONAVIRUS, NAA: SARS-CoV-2, NAA: NOT DETECTED

## 2019-03-30 ENCOUNTER — Ambulatory Visit (INDEPENDENT_AMBULATORY_CARE_PROVIDER_SITE_OTHER): Payer: BC Managed Care – PPO | Admitting: Family Medicine

## 2019-03-30 ENCOUNTER — Other Ambulatory Visit: Payer: Self-pay | Admitting: Family Medicine

## 2019-03-30 DIAGNOSIS — E8881 Metabolic syndrome: Secondary | ICD-10-CM

## 2019-03-30 DIAGNOSIS — E88819 Insulin resistance, unspecified: Secondary | ICD-10-CM

## 2019-03-30 DIAGNOSIS — F3289 Other specified depressive episodes: Secondary | ICD-10-CM

## 2019-03-30 MED ORDER — METFORMIN HCL 500 MG PO TABS: 500.0000 mg | ORAL_TABLET | Freq: Every day | ORAL | 0 refills | Status: DC

## 2019-03-30 MED ORDER — BUPROPION HCL ER (XL) 150 MG PO TB24: 150.0000 mg | ORAL_TABLET | Freq: Every day | ORAL | 0 refills | Status: DC

## 2019-04-10 DIAGNOSIS — Z20828 Contact with and (suspected) exposure to other viral communicable diseases: Secondary | ICD-10-CM | POA: Diagnosis not present

## 2019-04-17 ENCOUNTER — Encounter (INDEPENDENT_AMBULATORY_CARE_PROVIDER_SITE_OTHER): Payer: Self-pay | Admitting: Family Medicine

## 2019-04-17 ENCOUNTER — Ambulatory Visit (INDEPENDENT_AMBULATORY_CARE_PROVIDER_SITE_OTHER): Payer: BC Managed Care – PPO | Admitting: Family Medicine

## 2019-04-17 ENCOUNTER — Other Ambulatory Visit: Payer: Self-pay

## 2019-04-17 VITALS — BP 119/67 | HR 80 | Temp 98.2°F | Ht 65.0 in | Wt 269.0 lb

## 2019-04-17 DIAGNOSIS — Z6841 Body Mass Index (BMI) 40.0 and over, adult: Secondary | ICD-10-CM

## 2019-04-17 DIAGNOSIS — Z9189 Other specified personal risk factors, not elsewhere classified: Secondary | ICD-10-CM | POA: Diagnosis not present

## 2019-04-17 DIAGNOSIS — E8881 Metabolic syndrome: Secondary | ICD-10-CM | POA: Diagnosis not present

## 2019-04-17 DIAGNOSIS — F3289 Other specified depressive episodes: Secondary | ICD-10-CM | POA: Diagnosis not present

## 2019-04-17 MED ORDER — METFORMIN HCL 500 MG PO TABS: 500.0000 mg | ORAL_TABLET | Freq: Every day | ORAL | 0 refills | Status: DC

## 2019-04-17 MED ORDER — BUPROPION HCL ER (XL) 150 MG PO TB24: 150.0000 mg | ORAL_TABLET | Freq: Every day | ORAL | 0 refills | Status: DC

## 2019-04-17 NOTE — Progress Notes (Signed)
Chief Complaint:   OBESITY [name] is here to discuss her progress with her obesity treatment plan along with follow-up of her obesity related diagnoses. [name] is on the Category 3 Plan and states she is following her eating plan approximately 45-50% of the time. [name] states she is walking for 30-45 minutes 1-2 times per week.  Today's visit was #: 8 Starting weight: 282 lbs Starting date: 11/01/2018 Today's weight: 269 lbs Today's date: 04/17/2019 Total lbs lost to date: 13 Total lbs lost since last in-office visit: 5  Interim History: [name] does well with the plan on work days but deviates from the plan at home due to her family wanting other foods at dinner such as pizza.  Subjective:   1. Insulin resistance [name] reports that metformin does help with hunger. She is on metformin once daily. Lab Results  Component Value Date   HGBA1C 5.3 02/02/2019   2. Other depression [name] reports benefit from bupropion. She notes it helps with cravings.  3. At risk for diabetes mellitus [name] is at higher than average risk for developing diabetes due to her obesity.   Assessment/Plan:   1. Insulin resistance [name] will continue to work on weight loss, exercise, and decreasing simple carbohydrates to help decrease the risk of diabetes. We will refill metformin for 1 month. [name] agreed to follow-up with [place] as directed to closely monitor her progress.  - metFORMIN (GLUCOPHAGE) 500 MG tablet; Take 1 tablet (500 mg total) by mouth daily.  Dispense: 30 tablet; Refill: 0  2. Other depression Behavior modification techniques were discussed today to help [name] deal with her emotional/non-hunger eating behaviors. We will refill bupropion for 1 month. Orders and follow up as documented in patient record.   - buPROPion (WELLBUTRIN XL) 150 MG 24 hr tablet; Take 1 tablet (150 mg total) by mouth daily.  Dispense: 30 tablet; Refill: 0  3. At risk for diabetes  mellitus [name] was given approximately 15 minutes of diabetes education and counseling today. We discussed intensive lifestyle modifications today with an emphasis on weight loss as well as increasing exercise and decreasing simple carbohydrates in her diet. We also reviewed medication options with an emphasis on risk versus benefit of those discussed.   Repetitive spaced learning was employed today to elicit superior memory formation and behavioral change.  4. Class 3 severe obesity with serious comorbidity and body mass index (BMI) of 40.0 to 44.9 in adult, unspecified obesity type (HCC) [name] is currently in the action stage of change. As such, her goal is to continue with weight loss efforts. She has agreed to the Category 3 Plan and keeping a food journal and adhering to recommended goals of 450-650 calories and 40 grams of protein at supper daily.   Exercise goals: As is.  Behavioral modification strategies: decreasing simple carbohydrates and keeping a strict food journal.  [name] has agreed to follow-up with our clinic in 3 weeks. She was informed of the importance of frequent follow-up visits to maximize her success with intensive lifestyle modifications for her multiple health conditions.   Objective:   Blood pressure 119/67, pulse 80, temperature 98.2 F (36.8 C), temperature source Oral, height 5\' 5"  (1.651 m), weight 269 lb (122 kg), last menstrual period 03/27/2019, SpO2 96 %. Body mass index is 44.76 kg/m.  General: Cooperative, alert, well developed, in no acute distress. HEENT: Conjunctivae and lids unremarkable. Cardiovascular: Regular rhythm.  Lungs: Normal work of breathing. Neurologic: No focal deficits.   Lab Results  Component Value Date   CREATININE 0.85 02/02/2019   BUN 12 02/02/2019   NA 139 02/02/2019   K 4.5 02/02/2019   CL 103 02/02/2019   CO2 21 02/02/2019   Lab Results  Component Value Date   ALT 18 02/02/2019   AST 14 02/02/2019    ALKPHOS 55 02/02/2019   BILITOT <0.2 02/02/2019   Lab Results  Component Value Date   HGBA1C 5.3 02/02/2019   HGBA1C 5.5 11/01/2018   HGBA1C 5.5 07/06/2017   HGBA1C 5.7 05/05/2016   Lab Results  Component Value Date   INSULIN 25.3 (H) 02/02/2019   INSULIN 23.9 11/01/2018   Lab Results  Component Value Date   TSH 1.820 02/02/2019   Lab Results  Component Value Date   CHOL 129 02/02/2019   HDL 56 02/02/2019   LDLCALC 53 02/02/2019   TRIG 111 02/02/2019   CHOLHDL 2 05/05/2016   Lab Results  Component Value Date   WBC 9.0 02/02/2019   HGB 14.3 02/02/2019   HCT 45.3 02/02/2019   MCV 100 (H) 02/02/2019   PLT 410 02/02/2019   No results found for: IRON, TIBC, FERRITIN  Attestation Statements:   Reviewed by clinician on day of visit: allergies, medications, problem list, medical history, surgical history, family history, social history, and previous encounter notes.   Trude Mcburney, am acting as Energy manager for [employer], FNP-C.  I have reviewed the above documentation for accuracy and completeness, and I agree with the above. -  Jesse Sans, FNP

## 2019-04-18 ENCOUNTER — Encounter (INDEPENDENT_AMBULATORY_CARE_PROVIDER_SITE_OTHER): Payer: Self-pay | Admitting: Family Medicine

## 2019-04-18 DIAGNOSIS — E8881 Metabolic syndrome: Secondary | ICD-10-CM

## 2019-04-18 DIAGNOSIS — E88819 Insulin resistance, unspecified: Secondary | ICD-10-CM | POA: Insufficient documentation

## 2019-04-18 DIAGNOSIS — Z6841 Body Mass Index (BMI) 40.0 and over, adult: Secondary | ICD-10-CM | POA: Insufficient documentation

## 2019-04-18 MED ORDER — METFORMIN HCL 500 MG PO TABS: 500.0000 mg | ORAL_TABLET | Freq: Two times a day (BID) | ORAL | 0 refills | Status: DC

## 2019-04-18 NOTE — Telephone Encounter (Signed)
Please review

## 2019-05-02 ENCOUNTER — Other Ambulatory Visit: Payer: Self-pay

## 2019-05-02 ENCOUNTER — Encounter (INDEPENDENT_AMBULATORY_CARE_PROVIDER_SITE_OTHER): Payer: Self-pay | Admitting: Family Medicine

## 2019-05-02 ENCOUNTER — Ambulatory Visit (INDEPENDENT_AMBULATORY_CARE_PROVIDER_SITE_OTHER): Payer: BC Managed Care – PPO | Admitting: Family Medicine

## 2019-05-02 VITALS — BP 117/72 | HR 101 | Temp 97.9°F | Ht 65.0 in | Wt 268.0 lb

## 2019-05-02 DIAGNOSIS — Z6841 Body Mass Index (BMI) 40.0 and over, adult: Secondary | ICD-10-CM

## 2019-05-02 DIAGNOSIS — F3289 Other specified depressive episodes: Secondary | ICD-10-CM

## 2019-05-02 DIAGNOSIS — E8881 Metabolic syndrome: Secondary | ICD-10-CM

## 2019-05-02 DIAGNOSIS — Z9189 Other specified personal risk factors, not elsewhere classified: Secondary | ICD-10-CM

## 2019-05-02 MED ORDER — BUPROPION HCL ER (SR) 150 MG PO TB12: 150.0000 mg | ORAL_TABLET | Freq: Two times a day (BID) | ORAL | 0 refills | Status: DC

## 2019-05-02 MED ORDER — METFORMIN HCL 500 MG PO TABS: 500.0000 mg | ORAL_TABLET | Freq: Two times a day (BID) | ORAL | 0 refills | Status: DC

## 2019-05-02 NOTE — Progress Notes (Signed)
Chief Complaint:   OBESITY [name] is here to discuss her progress with her obesity treatment plan along with follow-up of her obesity related diagnoses. [name] is on the Category 3 Plan and keeping a food journal and adhering to recommended goals of 450-650 calories and 40 grams of protein daily and states she is following her eating plan approximately 45-50% of the time. [name] states she is doing 0 minutes 0 times per week.  Today's visit was #: 9 Starting weight: 282 lbs Starting date: 11/01/2018 Today's weight: 268 lbs Today's date: 05/02/2019 Total lbs lost to date: 14 Total lbs lost since last in-office visit: 1  Interim History: Avelynn reports significant sweet cravings over the weekend. She is eating all of the meals and prescribed protein.  Subjective:   1. Insulin resistance [name] is on metformin BID and she denies polyphagia. Lab Results  Component Value Date   HGBA1C 5.3 02/02/2019    2. Other depression, with emotional eating [name] reports increased sweet cravings. She is on bupropion 150 mg. Her mood is stable on bupropion and Effexor.  3. At risk for side effect of medication [name] is at risk for drug side effects due to increased dose of bupropion.  Assessment/Plan:   1. Insulin resistance [name] will continue to work on weight loss, exercise, and decreasing simple carbohydrates to help decrease the risk of diabetes. [name] agreed to follow-up with [place] as directed to closely monitor her progress.  - metFORMIN (GLUCOPHAGE) 500 MG tablet; Take 1 tablet (500 mg total) by mouth 2 (two) times daily with a meal.  Dispense: 60 tablet; Refill: 0  2. Other depression, with emotional eating Behavior modification techniques were discussed today to help [name] deal with her emotional/non-hunger eating behaviors.  Orders and follow up as documented in patient record.   - buPROPion (WELLBUTRIN SR) 150 MG 12 hr tablet; Take 1 tablet (150 mg total)  by mouth 2 (two) times daily.  Dispense: 60 tablet; Refill: 0  3. At risk for side effect of medication [name] was given approximately 15 minutes of drug side effect counseling today. We discussed side effect possibility and risk versus benefits. [name] agreed to the medication and will contact this office if these side effects are intolerable.  Repetitive spaced learning was employed today to elicit superior memory formation and behavioral change.  4. Class 3 severe obesity with serious comorbidity and body mass index (BMI) of 40.0 to 44.9 in adult, unspecified obesity type (HCC) [name] is currently in the action stage of change. As such, her goal is to continue with weight loss efforts. She has agreed to the Category 3 Plan.   Exercise goals: [name] will consider starting exercise.  Behavioral modification strategies: decreasing simple carbohydrates, better snacking choices and avoiding temptations.  [name] has agreed to follow-up with our clinic in 2 to 3 weeks. She was informed of the importance of frequent follow-up visits to maximize her success with intensive lifestyle modifications for her multiple health conditions.   Objective:   Blood pressure 117/72, pulse (!) 101, temperature 97.9 F (36.6 C), temperature source Oral, height 5\' 5"  (1.651 m), weight 268 lb (121.6 kg), SpO2 97 %. Body mass index is 44.6 kg/m.  General: Cooperative, alert, well developed, in no acute distress. HEENT: Conjunctivae and lids unremarkable. Cardiovascular: Regular rhythm.  Lungs: Normal work of breathing. Neurologic: No focal deficits.   Lab Results  Component Value Date   CREATININE 0.85 02/02/2019   BUN 12 02/02/2019   NA  139 02/02/2019   K 4.5 02/02/2019   CL 103 02/02/2019   CO2 21 02/02/2019   Lab Results  Component Value Date   ALT 18 02/02/2019   AST 14 02/02/2019   ALKPHOS 55 02/02/2019   BILITOT <0.2 02/02/2019   Lab Results  Component Value Date   HGBA1C 5.3  02/02/2019   HGBA1C 5.5 11/01/2018   HGBA1C 5.5 07/06/2017   HGBA1C 5.7 05/05/2016   Lab Results  Component Value Date   INSULIN 25.3 (H) 02/02/2019   INSULIN 23.9 11/01/2018   Lab Results  Component Value Date   TSH 1.820 02/02/2019   Lab Results  Component Value Date   CHOL 129 02/02/2019   HDL 56 02/02/2019   LDLCALC 53 02/02/2019   TRIG 111 02/02/2019   CHOLHDL 2 05/05/2016   Lab Results  Component Value Date   WBC 9.0 02/02/2019   HGB 14.3 02/02/2019   HCT 45.3 02/02/2019   MCV 100 (H) 02/02/2019   PLT 410 02/02/2019   No results found for: IRON, TIBC, FERRITIN  Attestation Statements:   Reviewed by clinician on day of visit: allergies, medications, problem list, medical history, surgical history, family history, social history, and previous encounter notes.   Trude Mcburney, am acting as Energy manager for [place], FNP-C.  I have reviewed the above documentation for accuracy and completeness, and I agree with the above. -  Jesse Sans, FNP

## 2019-05-16 ENCOUNTER — Other Ambulatory Visit: Payer: Self-pay

## 2019-05-16 ENCOUNTER — Ambulatory Visit (INDEPENDENT_AMBULATORY_CARE_PROVIDER_SITE_OTHER): Payer: BC Managed Care – PPO | Admitting: Family Medicine

## 2019-05-16 ENCOUNTER — Encounter (INDEPENDENT_AMBULATORY_CARE_PROVIDER_SITE_OTHER): Payer: Self-pay | Admitting: Family Medicine

## 2019-05-16 VITALS — BP 129/84 | HR 102 | Temp 98.4°F | Ht 65.0 in | Wt 269.0 lb

## 2019-05-16 DIAGNOSIS — Z9189 Other specified personal risk factors, not elsewhere classified: Secondary | ICD-10-CM

## 2019-05-16 DIAGNOSIS — F3289 Other specified depressive episodes: Secondary | ICD-10-CM

## 2019-05-16 DIAGNOSIS — E8881 Metabolic syndrome: Secondary | ICD-10-CM

## 2019-05-16 DIAGNOSIS — Z6841 Body Mass Index (BMI) 40.0 and over, adult: Secondary | ICD-10-CM

## 2019-05-16 MED ORDER — BUPROPION HCL ER (SR) 150 MG PO TB12: 150.0000 mg | ORAL_TABLET | Freq: Two times a day (BID) | ORAL | 0 refills | Status: DC

## 2019-05-16 MED ORDER — SAXENDA 18 MG/3ML ~~LOC~~ SOPN: 3.0000 mg | PEN_INJECTOR | Freq: Every day | SUBCUTANEOUS | 0 refills | Status: DC

## 2019-05-16 MED ORDER — BD PEN NEEDLE NANO 2ND GEN 32G X 4 MM MISC: 1.0000 | Freq: Every day | 0 refills | Status: DC

## 2019-05-17 ENCOUNTER — Encounter (INDEPENDENT_AMBULATORY_CARE_PROVIDER_SITE_OTHER): Payer: Self-pay | Admitting: Family Medicine

## 2019-05-17 NOTE — Progress Notes (Signed)
Chief Complaint:   OBESITY [name] is here to discuss her progress with her obesity treatment plan along with follow-up of her obesity related diagnoses. [name] is on the Category 3 Plan and states she is following her eating plan approximately 50% of the time. [name] states she is walking for 30 minutes 2 times per week.  Today's visit was #: 10 Starting weight: 282 lbs Starting date: 11/01/2018 Today's weight: 269 lbs Today's date: 05/16/2019 Total lbs lost to date: 13 Total lbs lost since last in-office visit: 0  Interim History: [name] struggles more when she is off work due to temptations at home. She is eating all of the protein on the plan.  Subjective:   1. Insulin resistance [name] denies polyphagia. She notes diarrhea from metformin. She is on metformin BID. Lab Results  Component Value Date   HGBA1C 5.3 02/02/2019   HGBA1C 5.5 11/01/2018   HGBA1C 5.5 07/06/2017   Lab Results  Component Value Date   LDLCALC 53 02/02/2019   CREATININE 0.85 02/02/2019     2. Other depression, with emotional eating [name] notes cravings for salt and savory.  3. At risk for nausea [name] is at risk for nausea due to starting Saxenda.  Assessment/Plan:   1. Insulin resistance [name] will continue to work on weight loss, exercise, and decreasing simple carbohydrates to help decrease the risk of diabetes. [name] agreed to continue metformin, and she agreed follow-up with [name] as directed to closely monitor her progress.  2. Other depression, with emotional eating Behavior modification techniques were discussed today to help [name] deal with her emotional/non-hunger eating behaviors. We will refill bupropion for 1 month. Orders and follow up as documented in patient record.   - buPROPion (WELLBUTRIN SR) 150 MG 12 hr tablet; Take 1 tablet (150 mg total) by mouth 2 (two) times daily.  Dispense: 60 tablet; Refill: 0  3. At risk for nausea [name] was  given approximately 15 minutes of nausea prevention counseling today. [name] is at risk for nausea due to her new or current medication. She was encouraged to titrate her medication slowly, make sure to stay hydrated, eat smaller portions throughout the day, and avoid high fat meals.   4. Class 3 severe obesity with serious comorbidity and body mass index (BMI) of 40.0 to 44.9 in adult, unspecified obesity type (HCC) [name] is currently in the action stage of change. As such, her goal is to continue with weight loss efforts. She has agreed to the Category 3 Plan.   We discussed various medication options to help [name] with her weight loss efforts and we both agreed to start Saxenda 3.0 mg SubQ daily with no refills, and nano needles #100 with no refills (she is to take 0.6 mg daily). She denies personal or family history of thyroid cancer, history of pancreatitis, or current cholelithiasis. [name] was informed of side effects.  - Liraglutide -Weight Management (SAXENDA) 18 MG/3ML SOPN; Inject 0.5 mLs (3 mg total) into the skin daily.  Dispense: 5 pen; Refill: 0 - Insulin Pen Needle (BD PEN NEEDLE NANO 2ND GEN) 32G X 4 MM MISC; 1 Device by Does not apply route daily.  Dispense: 100 each; Refill: 0  Exercise goals: As is.  Behavioral modification strategies: increasing lean protein intake, decreasing simple carbohydrates and planning for success.  [name] has agreed to follow-up with our clinic in 3 weeks. She was informed of the importance of frequent follow-up visits to maximize her success with intensive  lifestyle modifications for her multiple health conditions.   Objective:   Blood pressure 129/84, pulse (!) 102, temperature 98.4 F (36.9 C), temperature source Oral, height 5\' 5"  (1.651 m), weight 269 lb (122 kg), SpO2 96 %. Body mass index is 44.76 kg/m.  General: Cooperative, alert, well developed, in no acute distress. HEENT: Conjunctivae and lids unremarkable. Cardiovascular:  Regular rhythm.  Lungs: Normal work of breathing. Neurologic: No focal deficits.   Lab Results  Component Value Date   CREATININE 0.85 02/02/2019   BUN 12 02/02/2019   NA 139 02/02/2019   K 4.5 02/02/2019   CL 103 02/02/2019   CO2 21 02/02/2019   Lab Results  Component Value Date   ALT 18 02/02/2019   AST 14 02/02/2019   ALKPHOS 55 02/02/2019   BILITOT <0.2 02/02/2019   Lab Results  Component Value Date   HGBA1C 5.3 02/02/2019   HGBA1C 5.5 11/01/2018   HGBA1C 5.5 07/06/2017   HGBA1C 5.7 05/05/2016   Lab Results  Component Value Date   INSULIN 25.3 (H) 02/02/2019   INSULIN 23.9 11/01/2018   Lab Results  Component Value Date   TSH 1.820 02/02/2019   Lab Results  Component Value Date   CHOL 129 02/02/2019   HDL 56 02/02/2019   LDLCALC 53 02/02/2019   TRIG 111 02/02/2019   CHOLHDL 2 05/05/2016   Lab Results  Component Value Date   WBC 9.0 02/02/2019   HGB 14.3 02/02/2019   HCT 45.3 02/02/2019   MCV 100 (H) 02/02/2019   PLT 410 02/02/2019   No results found for: IRON, TIBC, FERRITIN  Attestation Statements:   Reviewed by clinician on day of visit: allergies, medications, problem list, medical history, surgical history, family history, social history, and previous encounter notes.   04/02/2019, am acting as Trude Mcburney for Energy manager, FNP-C.  I have reviewed the above documentation for accuracy and completeness, and I agree with the above. -  [place], FNP

## 2019-05-18 ENCOUNTER — Other Ambulatory Visit: Payer: Self-pay | Admitting: Emergency Medicine

## 2019-05-18 MED ORDER — TEMAZEPAM 15 MG PO CAPS: 15.0000 mg | ORAL_CAPSULE | Freq: Every evening | ORAL | 1 refills | Status: DC | PRN

## 2019-05-18 NOTE — Telephone Encounter (Signed)
Temazepam last rx 03/10/19 #30 1 RF LOV: 07/21/18 CPE

## 2019-05-19 ENCOUNTER — Other Ambulatory Visit (INDEPENDENT_AMBULATORY_CARE_PROVIDER_SITE_OTHER): Payer: Self-pay | Admitting: Family Medicine

## 2019-05-19 DIAGNOSIS — E8881 Metabolic syndrome: Secondary | ICD-10-CM

## 2019-05-22 ENCOUNTER — Encounter (INDEPENDENT_AMBULATORY_CARE_PROVIDER_SITE_OTHER): Payer: Self-pay | Admitting: Family Medicine

## 2019-05-22 DIAGNOSIS — E8881 Metabolic syndrome: Secondary | ICD-10-CM

## 2019-05-22 DIAGNOSIS — E88819 Insulin resistance, unspecified: Secondary | ICD-10-CM

## 2019-05-23 ENCOUNTER — Other Ambulatory Visit: Payer: Self-pay | Admitting: Emergency Medicine

## 2019-05-23 ENCOUNTER — Other Ambulatory Visit (INDEPENDENT_AMBULATORY_CARE_PROVIDER_SITE_OTHER): Payer: Self-pay | Admitting: Family Medicine

## 2019-05-23 DIAGNOSIS — F3289 Other specified depressive episodes: Secondary | ICD-10-CM

## 2019-05-23 DIAGNOSIS — F329 Major depressive disorder, single episode, unspecified: Secondary | ICD-10-CM

## 2019-05-23 DIAGNOSIS — F32A Depression, unspecified: Secondary | ICD-10-CM

## 2019-05-23 DIAGNOSIS — F419 Anxiety disorder, unspecified: Secondary | ICD-10-CM

## 2019-05-23 MED ORDER — VENLAFAXINE HCL ER 150 MG PO CP24: 150.0000 mg | ORAL_CAPSULE | Freq: Every day | ORAL | 1 refills | Status: DC

## 2019-05-23 MED ORDER — VICTOZA 18 MG/3ML ~~LOC~~ SOPN: 0.6000 mg | PEN_INJECTOR | Freq: Every day | SUBCUTANEOUS | 0 refills | Status: DC

## 2019-05-23 NOTE — Telephone Encounter (Signed)
Please advise 

## 2019-05-23 NOTE — Telephone Encounter (Signed)
See message below °

## 2019-05-29 ENCOUNTER — Ambulatory Visit (INDEPENDENT_AMBULATORY_CARE_PROVIDER_SITE_OTHER): Payer: BC Managed Care – PPO | Admitting: Family Medicine

## 2019-06-05 ENCOUNTER — Other Ambulatory Visit: Payer: Self-pay

## 2019-06-05 ENCOUNTER — Encounter (INDEPENDENT_AMBULATORY_CARE_PROVIDER_SITE_OTHER): Payer: Self-pay | Admitting: Family Medicine

## 2019-06-05 ENCOUNTER — Ambulatory Visit (INDEPENDENT_AMBULATORY_CARE_PROVIDER_SITE_OTHER): Payer: BC Managed Care – PPO | Admitting: Family Medicine

## 2019-06-05 VITALS — BP 103/74 | HR 91 | Temp 98.2°F | Ht 65.0 in | Wt 266.0 lb

## 2019-06-05 DIAGNOSIS — F3289 Other specified depressive episodes: Secondary | ICD-10-CM | POA: Diagnosis not present

## 2019-06-05 DIAGNOSIS — Z6841 Body Mass Index (BMI) 40.0 and over, adult: Secondary | ICD-10-CM

## 2019-06-05 DIAGNOSIS — E8881 Metabolic syndrome: Secondary | ICD-10-CM | POA: Diagnosis not present

## 2019-06-05 MED ORDER — METFORMIN HCL 500 MG PO TABS: 500.0000 mg | ORAL_TABLET | Freq: Two times a day (BID) | ORAL | 0 refills | Status: DC

## 2019-06-05 NOTE — Progress Notes (Signed)
Chief Complaint:   OBESITY [name] is here to discuss her progress with her obesity treatment plan along with follow-up of her obesity related diagnoses. [name] is on the Category 3 Plan and states she is following her eating plan approximately 50% of the time. [name] states she is walking for 30 minutes 2 times per week.  Today's visit was #: 11 Starting weight: 282 lbs Starting date: 11/01/2018 Today's weight: 266 lbs Today's date: 06/05/2019 Total lbs lost to date: 16 Total lbs lost since last in-office visit: 3  Interim History: [name] notes she is having decreased hunger with Victoza which was started at last OV. Bernie Covey was not covered by insurance until Qsymia is tried.. She notes deviating from her plan at supper at times. She is eating all of the protein on the plan.  Subjective:   1. Insulin resistance [name] was started on Victoza for polyphagia. Bernie Covey is not covered by her insurance. She had mild nausea which has subsided. She notes some mild constipation.  2. Other depression, with emotional eating [name]'s mood is stable on bupropion and Lexapro. She notices decreased cravings.  Assessment/Plan:   1. Insulin resistance [name] will continue to work on weight loss, exercise, and decreasing simple carbohydrates to help decrease the risk of diabetes. [name] agreed to continue Victoza at 0.6 mg, and we will refill metformin for 1 month. [name] agreed to follow-up with [place] as directed to closely monitor her progress.  - metFORMIN (GLUCOPHAGE) 500 MG tablet; Take 1 tablet (500 mg total) by mouth 2 (two) times daily with a meal.  Dispense: 60 tablet; Refill: 0  2. Other depression, with emotional eating Behavior modification techniques were discussed today to help [name] deal with her emotional/non-hunger eating behaviors. [name] agreed to continue bupropion. Orders and follow up as documented in patient record.   3. Class 3 severe obesity with  serious comorbidity and body mass index (BMI) of 40.0 to 44.9 in adult, unspecified obesity type (HCC) [name] is currently in the action stage of change. As such, her goal is to continue with weight loss efforts. She has agreed to the Category 3 Plan and keeping a food journal and adhering to recommended goals of 450-600 calories and 40 grams of protein at supper daily.   Exercise goals: As is.  Behavioral modification strategies: increasing lean protein intake and decreasing simple carbohydrates.  [name] has agreed to follow-up with our clinic in 3 weeks. She was informed of the importance of frequent follow-up visits to maximize her success with intensive lifestyle modifications for her multiple health conditions.   Objective:   Blood pressure 103/74, pulse 91, temperature 98.2 F (36.8 C), temperature source Oral, height 5\' 5"  (1.651 m), weight 266 lb (120.7 kg), SpO2 96 %. Body mass index is 44.26 kg/m.  General: Cooperative, alert, well developed, in no acute distress. HEENT: Conjunctivae and lids unremarkable. Cardiovascular: Regular rhythm.  Lungs: Normal work of breathing. Neurologic: No focal deficits.   Lab Results  Component Value Date   CREATININE 0.85 02/02/2019   BUN 12 02/02/2019   NA 139 02/02/2019   K 4.5 02/02/2019   CL 103 02/02/2019   CO2 21 02/02/2019   Lab Results  Component Value Date   ALT 18 02/02/2019   AST 14 02/02/2019   ALKPHOS 55 02/02/2019   BILITOT <0.2 02/02/2019   Lab Results  Component Value Date   HGBA1C 5.3 02/02/2019   HGBA1C 5.5 11/01/2018   HGBA1C 5.5 07/06/2017   HGBA1C 5.7  05/05/2016   Lab Results  Component Value Date   INSULIN 25.3 (H) 02/02/2019   INSULIN 23.9 11/01/2018   Lab Results  Component Value Date   TSH 1.820 02/02/2019   Lab Results  Component Value Date   CHOL 129 02/02/2019   HDL 56 02/02/2019   LDLCALC 53 02/02/2019   TRIG 111 02/02/2019   CHOLHDL 2 05/05/2016   Lab Results  Component Value  Date   WBC 9.0 02/02/2019   HGB 14.3 02/02/2019   HCT 45.3 02/02/2019   MCV 100 (H) 02/02/2019   PLT 410 02/02/2019   No results found for: IRON, TIBC, FERRITIN  Attestation Statements:   Reviewed by clinician on day of visit: allergies, medications, problem list, medical history, surgical history, family history, social history, and previous encounter notes.   Wilhemena Durie, am acting as Location manager for [employer], FNP-C.  I have reviewed the above documentation for accuracy and completeness, and I agree with the above. -  Georgianne Fick, FNP'

## 2019-06-06 ENCOUNTER — Encounter (INDEPENDENT_AMBULATORY_CARE_PROVIDER_SITE_OTHER): Payer: Self-pay | Admitting: Family Medicine

## 2019-06-23 ENCOUNTER — Ambulatory Visit (INDEPENDENT_AMBULATORY_CARE_PROVIDER_SITE_OTHER)
Admission: RE | Admit: 2019-06-23 | Discharge: 2019-06-23 | Disposition: A | Discharge status: Home / Self Care | Payer: BC Managed Care – PPO | Source: Ambulatory Visit

## 2019-06-23 DIAGNOSIS — S86011A Strain of right Achilles tendon, initial encounter: Secondary | ICD-10-CM | POA: Diagnosis not present

## 2019-06-23 DIAGNOSIS — M79671 Pain in right foot: Secondary | ICD-10-CM

## 2019-06-23 NOTE — Discharge Instructions (Signed)
Call an orthopedist to schedule an appointment  or go to the walk-in orthopedic clinic as soon as possible.    Avoid weight-bearing until you can be seen by orthopedics.

## 2019-06-23 NOTE — ED Provider Notes (Signed)
Virtual Visit via Video Note:  [name]  initiated request for Telemedicine visit with [facility] team. I connected with [name]  on 06/23/2019 at 1:47 PM  for a synchronized telemedicine visit using a video enabled HIPPA compliant telemedicine application. I verified that I am speaking with [name]  using two identifiers. Mickie Bail, NP  was physically located in a [facility] Urgent care site and [name] was located at a different location.   The limitations of evaluation and management by telemedicine as well as the availability of in-person appointments were discussed. Patient was informed that she  may incur a bill ( including co-pay) for this virtual visit encounter. [name]  expressed understanding and gave verbal consent to proceed with virtual visit.     History of Present Illness:[name]  is a 34 y.o. female presents for evaluation of 2 day history of pain, bruising, swelling of her right Achilles tendon area and heel.  The pain radiates up to her calf.  Her symptoms started after taking a 5 mile walk on 06/21/2019.  The pain is worse with weight-bearing and walking; improves with rest.  She denies fever, SOB, redness, warmth, rash, lesions, or other symptoms.     No Known Allergies   Past Medical History:  Diagnosis Date  . Allergy   . Anxiety   . Back pain   . Constipation   . Depression   . Dyspnea   . Gallbladder problem   . GERD (gastroesophageal reflux disease)   . IBS (irritable bowel syndrome)   . Interstitial cystitis   . Lactose intolerance   . Migraine   . Overactive bladder   . Palpitations   . PCOS (polycystic ovarian syndrome)   . Pelvic pain   . Pseudotumor cerebri   . Pseudotumor cerebri   . SUI (stress urinary incontinence, female)   . Urinary incontinence      Social History   Tobacco Use  . Smoking status: Never Smoker  . Smokeless tobacco: Never Used  Substance Use Topics   . Alcohol use: No  . Drug use: No   ROS: as stated in HPI.  All other systems reviewed and negative.      Observations/Objective: Physical Exam  VITALS: Patient denies fever. GENERAL: Alert, appears well and in no acute distress. HEENT: Atraumatic. NECK: Normal movements of the head and neck. CARDIOPULMONARY: No increased WOB. Speaking in clear sentences. I:E ratio WNL.  MS: Moves all visible extremities without noticeable abnormality. PSYCH: Pleasant and cooperative, well-groomed. Speech normal rate and rhythm. Affect is appropriate. Insight and judgement are appropriate. Attention is focused, linear, and appropriate.  NEURO: CN grossly intact. Oriented as arrived to appointment on time with no prompting. Moves both UE equally.  SKIN: No obvious lesions, wounds, erythema, or cyanosis noted on face or hands.   Assessment and Plan:    ICD-10-CM   1. Pain of right heel  M79.671        Follow Up Instructions: Discussed treatment options with patient.  Instructed her to follow up with orthopedics as soon as possible; discussed walk-in clinic available at local orthopedics.  Instructed her to avoid weight-bearing until seen.  She agrees to plan of care.      I discussed the assessment and treatment plan with the patient. The patient was provided an opportunity to ask questions and all were answered. The patient agreed with the plan and demonstrated an understanding  of the instructions.   The patient was advised to call back or seek an in-person evaluation if the symptoms worsen or if the condition fails to improve as anticipated.      Sharion Balloon, NP  06/23/2019 1:47 PM         Sharion Balloon, NP 06/23/19 1347

## 2019-06-26 ENCOUNTER — Other Ambulatory Visit (INDEPENDENT_AMBULATORY_CARE_PROVIDER_SITE_OTHER): Payer: Self-pay | Admitting: Family Medicine

## 2019-06-26 DIAGNOSIS — F3289 Other specified depressive episodes: Secondary | ICD-10-CM

## 2019-06-27 ENCOUNTER — Ambulatory Visit (INDEPENDENT_AMBULATORY_CARE_PROVIDER_SITE_OTHER): Payer: BC Managed Care – PPO | Admitting: Family Medicine

## 2019-06-28 DIAGNOSIS — S86811A Strain of other muscle(s) and tendon(s) at lower leg level, right leg, initial encounter: Secondary | ICD-10-CM | POA: Diagnosis not present

## 2019-07-03 ENCOUNTER — Ambulatory Visit (INDEPENDENT_AMBULATORY_CARE_PROVIDER_SITE_OTHER): Payer: BC Managed Care – PPO | Admitting: Family Medicine

## 2019-07-03 ENCOUNTER — Encounter (INDEPENDENT_AMBULATORY_CARE_PROVIDER_SITE_OTHER): Payer: Self-pay | Admitting: Family Medicine

## 2019-07-03 ENCOUNTER — Other Ambulatory Visit: Payer: Self-pay

## 2019-07-03 VITALS — BP 101/72 | HR 108 | Temp 98.2°F | Ht 65.0 in | Wt 266.0 lb

## 2019-07-03 DIAGNOSIS — E88819 Insulin resistance, unspecified: Secondary | ICD-10-CM

## 2019-07-03 DIAGNOSIS — Z6841 Body Mass Index (BMI) 40.0 and over, adult: Secondary | ICD-10-CM

## 2019-07-03 DIAGNOSIS — E66813 Obesity, class 3: Secondary | ICD-10-CM

## 2019-07-03 DIAGNOSIS — E8881 Metabolic syndrome: Secondary | ICD-10-CM

## 2019-07-03 DIAGNOSIS — K5909 Other constipation: Secondary | ICD-10-CM

## 2019-07-03 MED ORDER — VICTOZA 18 MG/3ML ~~LOC~~ SOPN: 0.6000 mg | PEN_INJECTOR | Freq: Every day | SUBCUTANEOUS | 0 refills | Status: DC

## 2019-07-03 NOTE — Progress Notes (Signed)
Chief Complaint:   OBESITY [name] is here to discuss her progress with her obesity treatment plan along with follow-up of her obesity related diagnoses. Palin is on the Category 3 Plan and keeping a food journal and adhering to recommended goals of 450-600 calories and 40 grams of protein and states she is following her eating plan approximately 40-50% of the time. Malavika states she is walking for 30-90 minutes 2 times per week.  Today's visit was #: 12 Starting weight: 282 lbs Starting date: 11/01/2018 Today's weight: 266 lbs Today's date: 07/03/2019 Total lbs lost to date: 16 Total lbs lost since last in-office visit: 0  Interim History: [name] is doing better at dinner. She is adapting to dinner on the plan. She is skipping breakfast at times. She tends to stick to the plan better at work.  Subjective:   1. Insulin resistance [name] has a diagnosis of insulin resistance based on her elevated fasting insulin level >5. She is on Victoza 0.6 mg for polyphagia and feels this works well. She is having some constipation. She continues to work on diet and exercise to decrease her risk of diabetes.  Lab Results  Component Value Date   INSULIN 25.3 (H) 02/02/2019   INSULIN 23.9 11/01/2018   Lab Results  Component Value Date   HGBA1C 5.3 02/02/2019   2. Other constipation Cathlin has been doing colace daily and still notes constipation.  Assessment/Plan:   1. Insulin resistance [name] will continue to work on weight loss, exercise, and decreasing simple carbohydrates to help decrease the risk of diabetes. [name] agreed to continue Victoza at 0.6 mg daily and we will refill for 1 month. [name] agreed to follow-up with [place] as directed to closely monitor her progress.  - liraglutide (VICTOZA) 18 MG/3ML SOPN; Inject 0.1 mLs (0.6 mg total) into the skin daily.  Dispense: 1 pen; Refill: 0  2. Other constipation [name] was informed that a decrease in bowel movement  frequency is normal while losing weight, but stools should not be hard or painful. [name] is to add miralax as needed.   3. Class 3 severe obesity with serious comorbidity and body mass index (BMI) of 40.0 to 44.9 in adult, unspecified obesity type (HCC) [name] is currently in the action stage of change. As such, her goal is to continue with weight loss efforts. She has agreed to the Category 3 Plan and keeping a food journal and adhering to recommended goals of 450-600 calories and 40 grams of protein at supper daily.   Exercise goals: As is.  Behavioral modification strategies: decreasing liquid calories, no skipping meals and planning for success.  [name] has agreed to follow-up with our clinic in 3 weeks. She was informed of the importance of frequent follow-up visits to maximize her success with intensive lifestyle modifications for her multiple health conditions.   Objective:   Blood pressure 101/72, pulse (!) 108, temperature 98.2 F (36.8 C), temperature source Oral, height 5\' 5"  (1.651 m), weight 266 lb (120.7 kg), SpO2 96 %. Body mass index is 44.26 kg/m.  General: Cooperative, alert, well developed, in no acute distress. HEENT: Conjunctivae and lids unremarkable. Cardiovascular: Regular rhythm.  Lungs: Normal work of breathing. Neurologic: No focal deficits.   Lab Results  Component Value Date   CREATININE 0.85 02/02/2019   BUN 12 02/02/2019   NA 139 02/02/2019   K 4.5 02/02/2019   CL 103 02/02/2019   CO2 21 02/02/2019   Lab Results  Component Value Date  ALT 18 02/02/2019   AST 14 02/02/2019   ALKPHOS 55 02/02/2019   BILITOT <0.2 02/02/2019   Lab Results  Component Value Date   HGBA1C 5.3 02/02/2019   HGBA1C 5.5 11/01/2018   HGBA1C 5.5 07/06/2017   HGBA1C 5.7 05/05/2016   Lab Results  Component Value Date   INSULIN 25.3 (H) 02/02/2019   INSULIN 23.9 11/01/2018   Lab Results  Component Value Date   TSH 1.820 02/02/2019   Lab Results   Component Value Date   CHOL 129 02/02/2019   HDL 56 02/02/2019   LDLCALC 53 02/02/2019   TRIG 111 02/02/2019   CHOLHDL 2 05/05/2016   Lab Results  Component Value Date   WBC 9.0 02/02/2019   HGB 14.3 02/02/2019   HCT 45.3 02/02/2019   MCV 100 (H) 02/02/2019   PLT 410 02/02/2019   No results found for: IRON, TIBC, FERRITIN  Attestation Statements:   Reviewed by clinician on day of visit: allergies, medications, problem list, medical history, surgical history, family history, social history, and previous encounter notes.   Trude Mcburney, am acting as Energy manager for [place], FNP-C.  I have reviewed the above documentation for accuracy and completeness, and I agree with the above. -  Jesse Sans, FNP

## 2019-07-05 ENCOUNTER — Encounter (INDEPENDENT_AMBULATORY_CARE_PROVIDER_SITE_OTHER): Payer: Self-pay | Admitting: Family Medicine

## 2019-07-12 ENCOUNTER — Encounter (INDEPENDENT_AMBULATORY_CARE_PROVIDER_SITE_OTHER): Payer: Self-pay | Admitting: Family Medicine

## 2019-07-12 MED ORDER — TRULICITY 0.75 MG/0.5ML ~~LOC~~ SOAJ
0.7500 mg | SUBCUTANEOUS | 0 refills | Status: DC
Start: 2019-07-12 — End: 2019-07-24

## 2019-07-12 NOTE — Telephone Encounter (Signed)
See message.

## 2019-07-12 NOTE — Telephone Encounter (Signed)
Please see message. °

## 2019-07-13 NOTE — Telephone Encounter (Signed)
FYI

## 2019-07-17 ENCOUNTER — Other Ambulatory Visit: Payer: Self-pay | Admitting: Physician Assistant

## 2019-07-17 MED ORDER — TEMAZEPAM 15 MG PO CAPS: 15.0000 mg | ORAL_CAPSULE | Freq: Every evening | ORAL | 1 refills | Status: DC | PRN

## 2019-07-20 ENCOUNTER — Other Ambulatory Visit (INDEPENDENT_AMBULATORY_CARE_PROVIDER_SITE_OTHER): Payer: Self-pay | Admitting: Family Medicine

## 2019-07-20 DIAGNOSIS — E8881 Metabolic syndrome: Secondary | ICD-10-CM

## 2019-07-24 ENCOUNTER — Ambulatory Visit (INDEPENDENT_AMBULATORY_CARE_PROVIDER_SITE_OTHER): Payer: BC Managed Care – PPO | Admitting: Family Medicine

## 2019-07-24 ENCOUNTER — Encounter (INDEPENDENT_AMBULATORY_CARE_PROVIDER_SITE_OTHER): Payer: Self-pay | Admitting: Family Medicine

## 2019-07-24 ENCOUNTER — Other Ambulatory Visit: Payer: Self-pay

## 2019-07-24 VITALS — BP 113/74 | HR 98 | Temp 98.1°F | Ht 65.0 in | Wt 264.0 lb

## 2019-07-24 DIAGNOSIS — F3289 Other specified depressive episodes: Secondary | ICD-10-CM

## 2019-07-24 DIAGNOSIS — Z6841 Body Mass Index (BMI) 40.0 and over, adult: Secondary | ICD-10-CM | POA: Diagnosis not present

## 2019-07-24 DIAGNOSIS — E8881 Metabolic syndrome: Secondary | ICD-10-CM | POA: Diagnosis not present

## 2019-07-24 MED ORDER — TRULICITY 0.75 MG/0.5ML ~~LOC~~ SOAJ: 0.7500 mg | SUBCUTANEOUS | 0 refills | Status: DC

## 2019-07-24 MED ORDER — METFORMIN HCL 500 MG PO TABS: 500.0000 mg | ORAL_TABLET | Freq: Two times a day (BID) | ORAL | 0 refills | Status: DC

## 2019-07-25 ENCOUNTER — Encounter (INDEPENDENT_AMBULATORY_CARE_PROVIDER_SITE_OTHER): Payer: Self-pay | Admitting: Family Medicine

## 2019-07-25 NOTE — Progress Notes (Signed)
Chief Complaint:   OBESITY [name] is here to discuss her progress with her obesity treatment plan along with follow-up of her obesity related diagnoses. [name] is on the Category 3 Plan and keeping a food journal and adhering to recommended goals of 450-600 calories and 40 grams of proteinat supper daily and states she is following her eating plan approximately 50% of the time. [name] states she is walking for 30 minutes 1-2 times per week.  Today's visit was #: 13 Starting weight: 282 lbs Starting date: 11/01/2018 Today's weight: 264 lbs Today's date: 07/24/2019 Total lbs lost to date: 18 Total lbs lost since last in-office visit: 2  Interim History: [name] sticks to the plan well on work days. She does tend to do portion control and make smart food choices on days she is off work rather than stick specifically to the plan.   Subjective:   1. Insulin resistance [name] has a diagnosis of insulin resistance based on her elevated fasting insulin level >5. She is on Trulicity 0.75 mg weekly. She notes Trulicity helps with her appetite. She continues to work on diet and exercise to decrease her risk of diabetes.  Lab Results  Component Value Date   INSULIN 25.3 (H) 02/02/2019   INSULIN 23.9 11/01/2018   Lab Results  Component Value Date   HGBA1C 5.3 02/02/2019   2. Other depression, with emotional eating [name] notes [name] does help with cravings. Her mood is stable on [name] and [name].  Assessment/Plan:   1. Insulin resistance Any will continue to work on weight loss, exercise, and decreasing simple carbohydrates to help decrease the risk of diabetes. We will refill metformin and Trulicity for 1 month. [name] agreed to follow-up with [place] as directed to closely monitor her progress.  - metFORMIN (GLUCOPHAGE) 500 MG tablet; Take 1 tablet (500 mg total) by mouth 2 (two) times daily with a meal.  Dispense: 60 tablet; Refill: 0 - Dulaglutide (TRULICITY)  0.75 MG/0.5ML SOPN; Inject 0.5 mLs (0.75 mg total) into the skin once a week.  Dispense: 4 pen; Refill: 0  2. Other depression, with emotional eating Behavior modification techniques were discussed today to help [name] deal with her emotional/non-hunger eating behaviors. [name] will continue [name] and [name]. Orders and follow up as documented in patient record.   3. Class 3 severe obesity with serious comorbidity and body mass index (BMI) of 40.0 to 44.9 in adult, unspecified obesity type (HCC) [name] is currently in the action stage of change. As such, her goal is to continue with weight loss efforts. She has agreed to the Category 3 Plan or keeping a food journal and adhering to recommended goals of 1600-1700 calories and 100 grams of protein daily.   Exercise goals: will continue current exercise regimen for weight loss and overall health benefits and use Bowflex 2 times per week for 30 minutes.  Behavioral modification strategies: increasing lean protein intake and planning for success.  [name] has agreed to follow-up with our clinic in 4 weeks. She was informed of the importance of frequent follow-up visits to maximize her success with intensive lifestyle modifications for her multiple health conditions.   Objective:   Blood pressure 113/74, pulse 98, temperature 98.1 F (36.7 C), temperature source Oral, height 5\' 5"  (1.651 m), weight 264 lb (119.7 kg), SpO2 96 %. Body mass index is 43.93 kg/m.  General: Cooperative, alert, well developed, in no acute distress. HEENT: Conjunctivae and lids unremarkable. Cardiovascular: Regular rhythm.  Lungs: Normal work of  breathing. Neurologic: No focal deficits.   Lab Results  Component Value Date   CREATININE 0.85 02/02/2019   BUN 12 02/02/2019   NA 139 02/02/2019   K 4.5 02/02/2019   CL 103 02/02/2019   CO2 21 02/02/2019   Lab Results  Component Value Date   ALT 18 02/02/2019   AST 14 02/02/2019   ALKPHOS 55 02/02/2019    BILITOT <0.2 02/02/2019   Lab Results  Component Value Date   HGBA1C 5.3 02/02/2019   HGBA1C 5.5 11/01/2018   HGBA1C 5.5 07/06/2017   HGBA1C 5.7 05/05/2016   Lab Results  Component Value Date   INSULIN 25.3 (H) 02/02/2019   INSULIN 23.9 11/01/2018   Lab Results  Component Value Date   TSH 1.820 02/02/2019   Lab Results  Component Value Date   CHOL 129 02/02/2019   HDL 56 02/02/2019   LDLCALC 53 02/02/2019   TRIG 111 02/02/2019   CHOLHDL 2 05/05/2016   Lab Results  Component Value Date   WBC 9.0 02/02/2019   HGB 14.3 02/02/2019   HCT 45.3 02/02/2019   MCV 100 (H) 02/02/2019   PLT 410 02/02/2019   No results found for: IRON, TIBC, FERRITIN  Attestation Statements:   Reviewed by clinician on day of visit: allergies, medications, problem list, medical history, surgical history, family history, social history, and previous encounter notes.   Trude Mcburney, am acting as Energy manager for [place], FNP-C.  I have reviewed the above documentation for accuracy and completeness, and I agree with the above. -  Jesse Sans, FNP

## 2019-08-10 ENCOUNTER — Other Ambulatory Visit (INDEPENDENT_AMBULATORY_CARE_PROVIDER_SITE_OTHER): Payer: Self-pay | Admitting: Family Medicine

## 2019-08-10 DIAGNOSIS — E8881 Metabolic syndrome: Secondary | ICD-10-CM

## 2019-08-21 ENCOUNTER — Encounter (INDEPENDENT_AMBULATORY_CARE_PROVIDER_SITE_OTHER): Payer: Self-pay | Admitting: Family Medicine

## 2019-08-21 ENCOUNTER — Other Ambulatory Visit: Payer: Self-pay

## 2019-08-21 ENCOUNTER — Ambulatory Visit (INDEPENDENT_AMBULATORY_CARE_PROVIDER_SITE_OTHER): Payer: BC Managed Care – PPO | Admitting: Family Medicine

## 2019-08-21 VITALS — BP 118/87 | HR 88 | Temp 97.7°F | Ht 65.0 in | Wt 263.0 lb

## 2019-08-21 DIAGNOSIS — F3289 Other specified depressive episodes: Secondary | ICD-10-CM

## 2019-08-21 DIAGNOSIS — E8881 Metabolic syndrome: Secondary | ICD-10-CM

## 2019-08-21 DIAGNOSIS — Z6841 Body Mass Index (BMI) 40.0 and over, adult: Secondary | ICD-10-CM | POA: Diagnosis not present

## 2019-08-21 MED ORDER — METFORMIN HCL 500 MG PO TABS: 500.0000 mg | ORAL_TABLET | Freq: Two times a day (BID) | ORAL | 0 refills | Status: DC

## 2019-08-21 MED ORDER — TRULICITY 0.75 MG/0.5ML ~~LOC~~ SOAJ: 1.5000 mg | SUBCUTANEOUS | 0 refills | Status: DC

## 2019-08-21 NOTE — Progress Notes (Signed)
Chief Complaint:   OBESITY [name] is here to discuss her progress with her obesity treatment plan along with follow-up of her obesity related diagnoses. [name] is on the Category 3 Plan or keeping a food journal and adhering to recommended goals of 1600-1700 calories and 100 grams of protein daily and states she is following her eating plan approximately 50% of the time. [name] states she is doing Boflex for 20-30 minutes 1 time per week.  Today's visit was #: 14 Starting weight: 282 lbs Starting date: 11/01/2018 Today's weight: 263 lbs Today's date: 08/21/2019 Total lbs lost to date: 19 Total lbs lost since last in-office visit: 1  Interim History: [name] is on the plan at breakfast and lunch, and then she uses portion control at supper and eats what her family eats.  Subjective:   1. Insulin resistance [name] has a diagnosis of insulin resistance based on her elevated fasting insulin level >5. She notes polyphagia. Has nausea when she does not eat. She continues to work on diet and exercise to decrease her risk of diabetes.  Lab Results  Component Value Date   INSULIN 25.3 (H) 02/02/2019   INSULIN 23.9 11/01/2018   Lab Results  Component Value Date   HGBA1C 5.3 02/02/2019   2. Other depression, with emotional eating [name] notes her cravings are well controlled except around the time of her menses.  Assessment/Plan:   1. Insulin resistance [name] will continue to work on weight loss, exercise, and decreasing simple carbohydrates to help decrease the risk of diabetes. [name] agreed to increase Trulicity to 1.5 mg SubQ weekly, and we will refill metformin for 1 month. [name] agreed to follow-up with [place] as directed to closely monitor her progress.  - metFORMIN (GLUCOPHAGE) 500 MG tablet; Take 1 tablet (500 mg total) by mouth 2 (two) times daily with a meal.  Dispense: 60 tablet; Refill: 0 - Dulaglutide (TRULICITY) 0.75 MG/0.5ML SOPN; Inject 1 mL (1.5 mg  total) into the skin once a week.  Dispense: 4 pen; Refill: 0  2. Other depression, with emotional eating Behavior modification techniques were discussed today to help [name] deal with her emotional/non-hunger eating behaviors. [name] will continue bupropion and Effexor. Orders and follow up as documented in patient record.   3. Class 3 severe obesity with serious comorbidity and body mass index (BMI) of 40.0 to 44.9 in adult, unspecified obesity type (HCC) [name] is currently in the action stage of change. As such, her goal is to continue with weight loss efforts. She has agreed to the Category 3 Plan.   Exercise goals: [name] is to increase her exercise to 2 times per week.  Behavioral modification strategies: increasing lean protein intake, decreasing simple carbohydrates and planning for success.  [name] has agreed to follow-up with our clinic in 3 weeks. She was informed of the importance of frequent follow-up visits to maximize her success with intensive lifestyle modifications for her multiple health conditions.   Objective:   Blood pressure (!) 118/87, pulse 88, temperature 97.7 F (36.5 C), temperature source Oral, height 5\' 5"  (1.651 m), weight (!) 263 lb (119.3 kg), SpO2 98 %. Body mass index is 43.77 kg/m.  General: Cooperative, alert, well developed, in no acute distress. HEENT: Conjunctivae and lids unremarkable. Cardiovascular: Regular rhythm.  Lungs: Normal work of breathing. Neurologic: No focal deficits.   Lab Results  Component Value Date   CREATININE 0.85 02/02/2019   BUN 12 02/02/2019   NA 139 02/02/2019   K 4.5 02/02/2019  CL 103 02/02/2019   CO2 21 02/02/2019   Lab Results  Component Value Date   ALT 18 02/02/2019   AST 14 02/02/2019   ALKPHOS 55 02/02/2019   BILITOT <0.2 02/02/2019   Lab Results  Component Value Date   HGBA1C 5.3 02/02/2019   HGBA1C 5.5 11/01/2018   HGBA1C 5.5 07/06/2017   HGBA1C 5.7 05/05/2016   Lab Results    Component Value Date   INSULIN 25.3 (H) 02/02/2019   INSULIN 23.9 11/01/2018   Lab Results  Component Value Date   TSH 1.820 02/02/2019   Lab Results  Component Value Date   CHOL 129 02/02/2019   HDL 56 02/02/2019   LDLCALC 53 02/02/2019   TRIG 111 02/02/2019   CHOLHDL 2 05/05/2016   Lab Results  Component Value Date   WBC 9.0 02/02/2019   HGB 14.3 02/02/2019   HCT 45.3 02/02/2019   MCV 100 (H) 02/02/2019   PLT 410 02/02/2019   No results found for: IRON, TIBC, FERRITIN  Attestation Statements:   Reviewed by clinician on day of visit: allergies, medications, problem list, medical history, surgical history, family history, social history, and previous encounter notes.   Trude Mcburney, am acting as Energy manager for [place], FNP-C.  I have reviewed the above documentation for accuracy and completeness, and I agree with the above. -  Jesse Sans, FNP

## 2019-08-25 ENCOUNTER — Other Ambulatory Visit (INDEPENDENT_AMBULATORY_CARE_PROVIDER_SITE_OTHER): Payer: Self-pay | Admitting: Family Medicine

## 2019-08-25 DIAGNOSIS — E8881 Metabolic syndrome: Secondary | ICD-10-CM

## 2019-08-25 DIAGNOSIS — E88819 Insulin resistance, unspecified: Secondary | ICD-10-CM

## 2019-09-06 ENCOUNTER — Encounter (INDEPENDENT_AMBULATORY_CARE_PROVIDER_SITE_OTHER): Payer: Self-pay | Admitting: Family Medicine

## 2019-09-06 DIAGNOSIS — E8881 Metabolic syndrome: Secondary | ICD-10-CM

## 2019-09-07 ENCOUNTER — Other Ambulatory Visit (INDEPENDENT_AMBULATORY_CARE_PROVIDER_SITE_OTHER): Payer: Self-pay | Admitting: Family Medicine

## 2019-09-07 DIAGNOSIS — E8881 Metabolic syndrome: Secondary | ICD-10-CM

## 2019-09-11 ENCOUNTER — Ambulatory Visit: Payer: BC Managed Care – PPO | Admitting: Family Medicine

## 2019-09-11 ENCOUNTER — Ambulatory Visit (INDEPENDENT_AMBULATORY_CARE_PROVIDER_SITE_OTHER): Payer: BC Managed Care – PPO | Admitting: Family Medicine

## 2019-09-11 ENCOUNTER — Other Ambulatory Visit: Payer: Self-pay

## 2019-09-11 ENCOUNTER — Encounter: Payer: Self-pay | Admitting: Family Medicine

## 2019-09-11 VITALS — BP 130/82 | HR 110 | Temp 98.6°F | Ht 65.0 in | Wt 236.0 lb

## 2019-09-11 DIAGNOSIS — H9202 Otalgia, left ear: Secondary | ICD-10-CM | POA: Diagnosis not present

## 2019-09-11 DIAGNOSIS — J209 Acute bronchitis, unspecified: Secondary | ICD-10-CM | POA: Diagnosis not present

## 2019-09-11 DIAGNOSIS — R300 Dysuria: Secondary | ICD-10-CM | POA: Diagnosis not present

## 2019-09-11 DIAGNOSIS — Z20828 Contact with and (suspected) exposure to other viral communicable diseases: Secondary | ICD-10-CM | POA: Diagnosis not present

## 2019-09-11 LAB — POC URINALSYSI DIPSTICK (AUTOMATED)
Bilirubin, UA: NEGATIVE
Glucose, UA: NEGATIVE
Ketones, UA: NEGATIVE
Leukocytes, UA: NEGATIVE
Nitrite, UA: NEGATIVE
Protein, UA: NEGATIVE
Spec Grav, UA: 1.025 (ref 1.010–1.025)
Urobilinogen, UA: 0.2 E.U./dL
pH, UA: 6 (ref 5.0–8.0)

## 2019-09-11 MED ORDER — TRULICITY 1.5 MG/0.5ML ~~LOC~~ SOAJ: 1.5000 mg | SUBCUTANEOUS | 0 refills | Status: DC

## 2019-09-11 NOTE — Patient Instructions (Signed)
Acute Bronchitis, Adult  Acute bronchitis is sudden or acute swelling of the air tubes (bronchi) in the lungs. Acute bronchitis causes these tubes to fill with mucus, which can make it hard to breathe. It can also cause coughing or wheezing. In adults, acute bronchitis usually goes away within 2 weeks. A cough caused by bronchitis may last up to 3 weeks. Smoking, allergies, and asthma can make the condition worse. What are the causes? This condition can be caused by germs and by substances that irritate the lungs, including:  Cold and flu viruses. The most common cause of this condition is the virus that causes the common cold.  Bacteria.  Substances that irritate the lungs, including: ? Smoke from cigarettes and other forms of tobacco. ? Dust and pollen. ? Fumes from chemical products, gases, or burned fuel. ? Other materials that pollute indoor or outdoor air.  Close contact with someone who has acute bronchitis. What increases the risk? The following factors may make you more likely to develop this condition:  A weak body's defense system, also called the immune system.  A condition that affects your lungs and breathing, such as asthma. What are the signs or symptoms? Common symptoms of this condition include:  Lung and breathing problems, such as: ? Coughing. This may bring up clear, yellow, or green mucus from your lungs (sputum). ? Wheezing. ? Having too much mucus in your lungs (chest congestion). ? Having shortness of breath.  A fever.  Chills.  Aches and pains, including: ? Tightness in your chest and other body aches. ? A sore throat. How is this diagnosed? This condition is usually diagnosed based on:  Your symptoms and medical history.  A physical exam. You may also have other tests, including tests to rule out other conditions, such pneumonia. These tests include:  A test of lung function.  Test of a mucus sample to look for the presence of  bacteria.  Tests to check the oxygen level in your blood.  Blood tests.  Chest X-ray. How is this treated? Most cases of acute bronchitis clear up over time without treatment. Your health care provider may recommend:  Drinking more fluids. This can thin your mucus, which may improve your breathing.  Taking a medicine for a fever or cough.  Using a device that gets medicine into your lungs (inhaler) to help improve breathing and control coughing.  Using a vaporizer or a humidifier. These are machines that add water to the air to help you breathe better. Follow these instructions at home: Activity  Get plenty of rest.  Return to your normal activities as told by your health care provider. Ask your health care provider what activities are safe for you. Lifestyle  Drink enough fluid to keep your urine pale yellow.  Do not drink alcohol.  Do not use any products that contain nicotine or tobacco, such as cigarettes, e-cigarettes, and chewing tobacco. If you need help quitting, ask your health care provider. Be aware that: ? Your bronchitis will get worse if you smoke or breathe in other people's smoke (secondhand smoke). ? Your lungs will heal faster if you quit smoking. General instructions   Take over-the-counter and prescription medicines only as told by your health care provider.  Use an inhaler, vaporizer, or humidifier as told by your health care provider.  If you have a sore throat, gargle with a salt-water mixture 3-4 times a day or as needed. To make a salt-water mixture, completely dissolve -1 tsp (3-6   g) of salt in 1 cup (237 mL) of warm water.  Keep all follow-up visits as told by your health care provider. This is important. How is this prevented? To lower your risk of getting this condition again:  Wash your hands often with soap and water. If soap and water are not available, use hand sanitizer.  Avoid contact with people who have cold symptoms.  Try not to  touch your mouth, nose, or eyes with your hands.  Avoid places where there are fumes from chemicals. Breathing these fumes will make your condition worse.  Get the flu shot every year. Contact a health care provider if:  Your symptoms do not improve after 2 weeks of treatment.  You vomit more than once or twice.  You have symptoms of dehydration such as: ? Dark urine. ? Dry skin or eyes. ? Increased thirst. ? Headaches. ? Confusion. ? Muscle cramps. Get help right away if you:  Cough up blood.  Feel pain in your chest.  Have severe shortness of breath.  Faint or keep feeling like you are going to faint.  Have a severe headache.  Have fever or chills that get worse. These symptoms may represent a serious problem that is an emergency. Do not wait to see if the symptoms will go away. Get medical help right away. Call your local emergency services (911 in the [place] Do not drive yourself to the hospital. Summary  Acute bronchitis is sudden (acute) inflammation of the air tubes (bronchi) between the windpipe and the lungs. In adults, acute bronchitis usually goes away within 2 weeks, although coughing may last 3 weeks or longer  Take over-the-counter and prescription medicines only as told by your health care provider.  Drink enough fluid to keep your urine pale yellow.  Contact a health care provider if your symptoms do not improve after 2 weeks of treatment.  Get help right away if you cough up blood, faint, or have chest pain or shortness of breath. This information is not intended to replace advice given to you by your health care provider. Make sure you discuss any questions you have with your health care provider. Document Revised: 09/26/2018 Document Reviewed: 08/05/2018 [employer] Patient Education  2020 [employer]  

## 2019-09-11 NOTE — Progress Notes (Signed)
Established Patient Office Visit  Subjective:  Patient ID: [name], female    DOB: [birth year]  Age: 34 y.o. MRN: [id number]  CC:  Chief Complaint  Patient presents with  . Chest Pain  . Cough  . Ear Pain    Left ear  . Abdominal Pain    no dysuria no frequent urination "pain where bladder is"   . Nasal Congestion    HPI [name] presents for upper respiratory symptoms and concern for possible UTI.  She states last Monday she developed some sore throat.  She has large tonsils at baseline and tends to have inflammation of her tonsils frequently.  On Wednesday she states that her chest felt "tight "similar to previous episodes of bronchitis.  She subsequently developed some cough.  She went to work and had PCR testing for Covid on Wednesday which came back negative.  No fever.  Cough is nonproductive.  She apparently does have history of some reactive airway issues and has albuterol inhaler.  She is not aware of any obvious wheezing.  She developed left earache 2 days ago.  No drainage.  History of frequent otitis media in the past.  No hearing changes.  Intermittent mild dysuria.  No flank pain.  No gross hematuria.  Past Medical History:  Diagnosis Date  . Allergy   . Anxiety   . Back pain   . Constipation   . Depression   . Dyspnea   . Gallbladder problem   . GERD (gastroesophageal reflux disease)   . IBS (irritable bowel syndrome)   . Interstitial cystitis   . Lactose intolerance   . Migraine   . Overactive bladder   . Palpitations   . PCOS (polycystic ovarian syndrome)   . Pelvic pain   . Pseudotumor cerebri   . Pseudotumor cerebri   . SUI (stress urinary incontinence, female)   . Urinary incontinence     Past Surgical History:  Procedure Laterality Date  . CESAREAN SECTION  2009  &  2011   2011 W/ BILATERAL TUBAL LIGATION  . CHOLECYSTECTOMY  2010  . CYSTO WITH HYDRODISTENSION  03/01/2012   Procedure: CYSTOSCOPY/HYDRODISTENSION;  Surgeon: Martina Sinner, MD;  Location: [facility];  Service: Urology;  Laterality: N/A;  INSTILLATION of marcaine and pyridium.  . PUBOVAGINAL SLING N/A 02/14/2013   Procedure: PUBO-VAGINAL SLING AND HYDRODISTENSION;  Surgeon: Martina Sinner, MD;  Location: [facility];  Service: Urology;  Laterality: N/A;  . TRANSTHORACIC ECHOCARDIOGRAM  06/2009  . TUBAL LIGATION      Family History  Problem Relation Age of Onset  . Hypertension Mother   . Depression Mother   . Diabetes Mother   . Anxiety disorder Mother   . Sleep apnea Mother   . Obesity Mother   . Hypertension Father   . Cancer Father        kidney  . Depression Father   . Hyperlipidemia Father   . Kidney cancer Father   . Alcoholism Father   . Obesity Father   . Cancer Maternal Uncle        stomach  . Hypertension Maternal Grandmother   . Cancer Maternal Grandmother        kidney, bone  . Hypertension Maternal Grandfather   . Cancer Maternal Grandfather        lung, bone  . Stroke Maternal Grandfather   . Hypertension Paternal Grandmother   . Hypertension Paternal Grandfather   . Stroke  Paternal Grandfather     Social History   Socioeconomic History  . Marital status: Married    Spouse name: Miosha Behe  . Number of children: 2  . Years of education: Not on file  . Highest education level: Not on file  Occupational History  . Occupation: Designer, jewellery    Employer: [employer]  Tobacco Use  . Smoking status: Never Smoker  . Smokeless tobacco: Never Used  Vaping Use  . Vaping Use: Never used  Substance and Sexual Activity  . Alcohol use: No  . Drug use: No  . Sexual activity: Yes    Birth control/protection: Surgical  Other Topics Concern  . Not on file  Social History Narrative  . Not on file   Social Determinants of Health   Financial Resource Strain:   . Difficulty of Paying Living Expenses:   Food Insecurity:   . Worried About Programme researcher, broadcasting/film/video in the Last  Year:   . Barista in the Last Year:   Transportation Needs:   . Freight forwarder (Medical):   Marland Kitchen Lack of Transportation (Non-Medical):   Physical Activity:   . Days of Exercise per Week:   . Minutes of Exercise per Session:   Stress:   . Feeling of Stress :   Social Connections:   . Frequency of Communication with Friends and Family:   . Frequency of Social Gatherings with Friends and Family:   . Attends Religious Services:   . Active Member of Clubs or Organizations:   . Attends Banker Meetings:   Marland Kitchen Marital Status:   Intimate Partner Violence:   . Fear of Current or Ex-Partner:   . Emotionally Abused:   Marland Kitchen Physically Abused:   . Sexually Abused:     Outpatient Medications Prior to Visit  Medication Sig Dispense Refill  . amitriptyline (ELAVIL) 25 MG tablet TAKE 2 TABLETS BY MOUTH AT BEDTIME 180 tablet 1  . Biotin 5000 MCG CAPS Take 1 capsule by mouth daily.    Marland Kitchen buPROPion (WELLBUTRIN SR) 150 MG 12 hr tablet Take 1 tablet (150 mg total) by mouth 2 (two) times daily. 60 tablet 0  . butalbital-acetaminophen-caffeine (FIORICET, ESGIC) 50-325-40 MG per tablet Take 1 tablet by mouth 2 (two) times daily as needed for headache.    . cetirizine (ZYRTEC) 10 MG tablet Take 10 mg by mouth daily.    . cimetidine (TAGAMET) 200 MG tablet Take 200 mg by mouth 2 (two) times daily.    . diclofenac (VOLTAREN) 50 MG EC tablet Take 50 mg by mouth 2 (two) times daily as needed.    . dicyclomine (BENTYL) 10 MG capsule Take 10 mg by mouth as needed for spasms.    . fluticasone (FLONASE) 50 MCG/ACT nasal spray Place 2 sprays into both nostrils daily. 16 g 6  . Melatonin 10 MG TABS Take 10 mg by mouth at bedtime.     . metFORMIN (GLUCOPHAGE) 500 MG tablet Take 1 tablet (500 mg total) by mouth 2 (two) times daily with a meal. 60 tablet 0  . Multiple Vitamin (MULTIVITAMIN) tablet Take 1 tablet by mouth daily.    . Probiotic Product (PROBIOTIC-10 PO) Take 1 tablet by mouth 2 (two)  times daily.     Marland Kitchen spironolactone (ALDACTONE) 100 MG tablet Take 100 mg by mouth daily.  4  . SPRINTEC 28 0.25-35 MG-MCG tablet Take 1 tablet by mouth daily.  4  . temazepam (RESTORIL) 15 MG capsule  Take 1 capsule (15 mg total) by mouth at bedtime as needed for sleep. 30 capsule 1  . venlafaxine XR (EFFEXOR-XR) 150 MG 24 hr capsule Take 1 capsule (150 mg total) by mouth daily with breakfast. 90 capsule 1  . Dulaglutide (TRULICITY) 0.75 MG/0.5ML SOPN Inject 1 mL (1.5 mg total) into the skin once a week. 4 pen 0  . SUMAtriptan (IMITREX) 50 MG tablet Take 1 tablet (50 mg total) by mouth once for 1 dose. May repeat in 2 hours if headache persists or recurs. 10 tablet 0   No facility-administered medications prior to visit.    No Known Allergies  ROS Review of Systems  Constitutional: Negative for chills and fever.  HENT: Positive for congestion and sore throat.   Respiratory: Positive for cough. Negative for shortness of breath and wheezing.   Cardiovascular: Negative for chest pain.  Genitourinary: Positive for dysuria. Negative for flank pain and hematuria.      Objective:    Physical Exam Vitals reviewed.  Constitutional:      Appearance: She is well-developed.  HENT:     Mouth/Throat:     Mouth: Mucous membranes are moist. No oral lesions.     Pharynx: No oropharyngeal exudate or posterior oropharyngeal erythema.     Tonsils: No tonsillar exudate. 2+ on the right. 2+ on the left.     Comments: He does have symmetrically enlarged tonsils but no erythema no exudate. Cardiovascular:     Rate and Rhythm: Normal rate and regular rhythm.  Pulmonary:     Effort: No respiratory distress.     Breath sounds: Normal breath sounds.  Musculoskeletal:     Cervical back: Neck supple.  Lymphadenopathy:     Cervical: No cervical adenopathy.  Neurological:     Mental Status: She is alert.     BP 130/82 (BP Location: Right Arm, Patient Position: Sitting, Cuff Size: Large)   Pulse (!)  110   Temp 98.6 F (37 C) (Oral)   Ht 5\' 5"  (1.651 m)   Wt 236 lb (107 kg)   SpO2 97%   BMI 39.27 kg/m  Wt Readings from Last 3 Encounters:  09/11/19 236 lb (107 kg)  08/21/19 (!) 263 lb (119.3 kg)  07/24/19 264 lb (119.7 kg)     Health Maintenance Due  Topic Date Due  . Hepatitis C Screening  Never done  . DTAP VACCINES (1) 08/09/1983  . INFLUENZA VACCINE  08/27/2019       Topic Date Due  . DTAP VACCINES (1) 08/09/1983    Lab Results  Component Value Date   TSH 1.820 02/02/2019   Lab Results  Component Value Date   WBC 9.0 02/02/2019   HGB 14.3 02/02/2019   HCT 45.3 02/02/2019   MCV 100 (H) 02/02/2019   PLT 410 02/02/2019   Lab Results  Component Value Date   NA 139 02/02/2019   K 4.5 02/02/2019   CO2 21 02/02/2019   GLUCOSE 97 02/02/2019   BUN 12 02/02/2019   CREATININE 0.85 02/02/2019   BILITOT <0.2 02/02/2019   ALKPHOS 55 02/02/2019   AST 14 02/02/2019   ALT 18 02/02/2019   PROT 6.9 02/02/2019   ALBUMIN 4.0 02/02/2019   CALCIUM 9.2 02/02/2019   ANIONGAP 9 12/28/2018   GFR 79.35 05/24/2018   Lab Results  Component Value Date   CHOL 129 02/02/2019   Lab Results  Component Value Date   HDL 56 02/02/2019   Lab Results  Component Value Date  LDLCALC 53 02/02/2019   Lab Results  Component Value Date   TRIG 111 02/02/2019   Lab Results  Component Value Date   CHOLHDL 2 05/05/2016   Lab Results  Component Value Date   HGBA1C 5.3 02/02/2019      Assessment & Plan:   #1 patient presents with respiratory symptoms of mild sore throat, cough, nasal congestion and left otalgia.  Left ear exam reveals no signs of acute suppurative otitis.  Recent Covid testing as above negative.  Suspect viral process.  -Symptomatic measures with plenty of fluids and rest. -Continue medications for allergies including Flonase and Zyrtec  #2 mild dysuria.  Leukocytes and nitrites negative on dipstick. She did have 2+ blood on dipstick but states she is  due to start her period soon and thinks may be related to that.  No orders of the defined types were placed in this encounter.   Follow-up: No follow-ups on file.    Evelena PeatBruce Burchette, MD

## 2019-09-17 ENCOUNTER — Other Ambulatory Visit: Payer: Self-pay

## 2019-09-17 ENCOUNTER — Encounter (HOSPITAL_COMMUNITY): Payer: Self-pay

## 2019-09-17 ENCOUNTER — Emergency Department (HOSPITAL_COMMUNITY)
Admission: EM | Admit: 2019-09-17 | Discharge: 2019-09-18 | Disposition: A | Discharge status: Home / Self Care | Payer: BC Managed Care – PPO | Attending: Emergency Medicine | Admitting: Emergency Medicine

## 2019-09-17 DIAGNOSIS — R1031 Right lower quadrant pain: Secondary | ICD-10-CM | POA: Insufficient documentation

## 2019-09-17 DIAGNOSIS — Z5321 Procedure and treatment not carried out due to patient leaving prior to being seen by health care provider: Secondary | ICD-10-CM | POA: Diagnosis not present

## 2019-09-17 DIAGNOSIS — R110 Nausea: Secondary | ICD-10-CM | POA: Insufficient documentation

## 2019-09-17 LAB — URINALYSIS, ROUTINE W REFLEX MICROSCOPIC
Bacteria, UA: NONE SEEN
Bilirubin Urine: NEGATIVE
Glucose, UA: NEGATIVE mg/dL
Ketones, ur: 5 mg/dL — AB
Leukocytes,Ua: NEGATIVE
Nitrite: NEGATIVE
Protein, ur: NEGATIVE mg/dL
Specific Gravity, Urine: 1.023 (ref 1.005–1.030)
pH: 5 (ref 5.0–8.0)

## 2019-09-17 LAB — COMPREHENSIVE METABOLIC PANEL WITH GFR
ALT: 21 U/L (ref 0–44)
AST: 19 U/L (ref 15–41)
Albumin: 3.1 g/dL — ABNORMAL LOW (ref 3.5–5.0)
Alkaline Phosphatase: 51 U/L (ref 38–126)
Anion gap: 10 (ref 5–15)
BUN: 10 mg/dL (ref 6–20)
CO2: 24 mmol/L (ref 22–32)
Calcium: 9.1 mg/dL (ref 8.9–10.3)
Chloride: 105 mmol/L (ref 98–111)
Creatinine, Ser: 0.92 mg/dL (ref 0.44–1.00)
GFR calc Af Amer: >60 mL/min
GFR calc non Af Amer: >60 mL/min
Glucose, Bld: 101 mg/dL — ABNORMAL HIGH (ref 70–99)
Potassium: 3.9 mmol/L (ref 3.5–5.1)
Sodium: 139 mmol/L (ref 135–145)
Total Bilirubin: 0.4 mg/dL (ref 0.3–1.2)
Total Protein: 6.6 g/dL (ref 6.5–8.1)

## 2019-09-17 LAB — CBC
HCT: 44.6 % (ref 36.0–46.0)
Hemoglobin: 14.4 g/dL (ref 12.0–15.0)
MCH: 31.2 pg (ref 26.0–34.0)
MCHC: 32.3 g/dL (ref 30.0–36.0)
MCV: 96.7 fL (ref 80.0–100.0)
Platelets: 384 10*3/uL (ref 150–400)
RBC: 4.61 MIL/uL (ref 3.87–5.11)
RDW: 13.5 % (ref 11.5–15.5)
WBC: 9.8 10*3/uL (ref 4.0–10.5)
nRBC: 0 % (ref 0.0–0.2)

## 2019-09-17 LAB — LIPASE, BLOOD: Lipase: 25 U/L (ref 11–51)

## 2019-09-17 NOTE — ED Triage Notes (Signed)
Patient complains of RLQ pain that started this am and worse with any movement. Nausea. No vomiting, no diarrhea, denies fever. Denies dysuria

## 2019-09-18 ENCOUNTER — Encounter (INDEPENDENT_AMBULATORY_CARE_PROVIDER_SITE_OTHER): Payer: Self-pay

## 2019-09-18 NOTE — ED Notes (Signed)
Patient called x2 for vitals recheck with no response  

## 2019-09-21 ENCOUNTER — Other Ambulatory Visit (INDEPENDENT_AMBULATORY_CARE_PROVIDER_SITE_OTHER): Payer: Self-pay | Admitting: Family Medicine

## 2019-09-21 DIAGNOSIS — E8881 Metabolic syndrome: Secondary | ICD-10-CM

## 2019-09-25 ENCOUNTER — Other Ambulatory Visit: Payer: Self-pay | Admitting: Physician Assistant

## 2019-09-26 NOTE — Telephone Encounter (Signed)
Temazepam last rx 07/17/19 #30 1 RF LOV: 07/21/18 CPE

## 2019-09-29 ENCOUNTER — Encounter: Payer: Self-pay | Admitting: Physician Assistant

## 2019-10-06 ENCOUNTER — Other Ambulatory Visit (INDEPENDENT_AMBULATORY_CARE_PROVIDER_SITE_OTHER): Payer: Self-pay | Admitting: Family Medicine

## 2019-10-06 DIAGNOSIS — E8881 Metabolic syndrome: Secondary | ICD-10-CM

## 2019-10-09 ENCOUNTER — Other Ambulatory Visit: Payer: Self-pay

## 2019-10-09 ENCOUNTER — Other Ambulatory Visit: Payer: Self-pay | Admitting: Physician Assistant

## 2019-10-09 ENCOUNTER — Ambulatory Visit (INDEPENDENT_AMBULATORY_CARE_PROVIDER_SITE_OTHER): Payer: BC Managed Care – PPO | Admitting: Family Medicine

## 2019-10-09 ENCOUNTER — Encounter: Payer: Self-pay | Admitting: Physician Assistant

## 2019-10-09 ENCOUNTER — Ambulatory Visit (INDEPENDENT_AMBULATORY_CARE_PROVIDER_SITE_OTHER): Payer: BC Managed Care – PPO | Admitting: Physician Assistant

## 2019-10-09 ENCOUNTER — Encounter (INDEPENDENT_AMBULATORY_CARE_PROVIDER_SITE_OTHER): Payer: Self-pay | Admitting: Family Medicine

## 2019-10-09 VITALS — BP 113/81 | HR 101 | Temp 98.6°F | Ht 65.0 in | Wt 265.0 lb

## 2019-10-09 VITALS — BP 120/80 | HR 106 | Temp 99.1°F | Resp 16 | Ht 65.0 in | Wt 265.0 lb

## 2019-10-09 DIAGNOSIS — G4700 Insomnia, unspecified: Secondary | ICD-10-CM | POA: Diagnosis not present

## 2019-10-09 DIAGNOSIS — F3289 Other specified depressive episodes: Secondary | ICD-10-CM

## 2019-10-09 DIAGNOSIS — Z0000 Encounter for general adult medical examination without abnormal findings: Secondary | ICD-10-CM

## 2019-10-09 DIAGNOSIS — Z9189 Other specified personal risk factors, not elsewhere classified: Secondary | ICD-10-CM | POA: Diagnosis not present

## 2019-10-09 DIAGNOSIS — E8881 Metabolic syndrome: Secondary | ICD-10-CM | POA: Diagnosis not present

## 2019-10-09 DIAGNOSIS — F3342 Major depressive disorder, recurrent, in full remission: Secondary | ICD-10-CM

## 2019-10-09 DIAGNOSIS — Z6841 Body Mass Index (BMI) 40.0 and over, adult: Secondary | ICD-10-CM

## 2019-10-09 DIAGNOSIS — E88819 Insulin resistance, unspecified: Secondary | ICD-10-CM

## 2019-10-09 LAB — CBC WITH DIFFERENTIAL/PLATELET
Basophils Absolute: 0.1 10*3/uL (ref 0.0–0.1)
Basophils Relative: 0.7 % (ref 0.0–3.0)
Eosinophils Absolute: 0.1 10*3/uL (ref 0.0–0.7)
Eosinophils Relative: 1.3 % (ref 0.0–5.0)
HCT: 40.1 % (ref 36.0–46.0)
Hemoglobin: 13.6 g/dL (ref 12.0–15.0)
Lymphocytes Relative: 32.2 % (ref 12.0–46.0)
Lymphs Abs: 3 10*3/uL (ref 0.7–4.0)
MCHC: 34 g/dL (ref 30.0–36.0)
MCV: 93.8 fl (ref 78.0–100.0)
Monocytes Absolute: 0.4 10*3/uL (ref 0.1–1.0)
Monocytes Relative: 3.9 % (ref 3.0–12.0)
Neutro Abs: 5.7 10*3/uL (ref 1.4–7.7)
Neutrophils Relative %: 61.9 % (ref 43.0–77.0)
Platelets: 370 10*3/uL (ref 150.0–400.0)
RBC: 4.27 Mil/uL (ref 3.87–5.11)
RDW: 13.5 % (ref 11.5–15.5)
WBC: 9.2 10*3/uL (ref 4.0–10.5)

## 2019-10-09 LAB — LIPID PANEL
Cholesterol: 115 mg/dL (ref 0–200)
HDL: 51.6 mg/dL (ref >39.00)
LDL Cholesterol: 36 mg/dL (ref 0–99)
NonHDL: 63.81
Total CHOL/HDL Ratio: 2
Triglycerides: 140 mg/dL (ref 0.0–149.0)
VLDL: 28 mg/dL (ref 0.0–40.0)

## 2019-10-09 LAB — HEMOGLOBIN A1C: Hgb A1c MFr Bld: 5.6 % (ref 4.6–6.5)

## 2019-10-09 MED ORDER — METFORMIN HCL 500 MG PO TABS: 500.0000 mg | ORAL_TABLET | Freq: Two times a day (BID) | ORAL | 0 refills | Status: DC

## 2019-10-09 MED ORDER — TEMAZEPAM 22.5 MG PO CAPS: 22.5000 mg | ORAL_CAPSULE | Freq: Every evening | ORAL | 0 refills | Status: DC | PRN

## 2019-10-09 MED ORDER — BUPROPION HCL ER (SR) 150 MG PO TB12: 150.0000 mg | ORAL_TABLET | Freq: Two times a day (BID) | ORAL | 0 refills | Status: DC

## 2019-10-09 MED ORDER — SAXENDA 18 MG/3ML ~~LOC~~ SOPN: 3.0000 mg | PEN_INJECTOR | Freq: Every day | SUBCUTANEOUS | 0 refills | Status: DC

## 2019-10-09 NOTE — Patient Instructions (Signed)
Please go to the lab for blood work.   Our office will call you with your results unless you have chosen to receive results via MyChart.  If your blood work is normal we will follow-up each year for physicals and as scheduled for chronic medical problems.  If anything is abnormal we will treat accordingly and get you in for a follow-up.  Please try the new dose of Restoril at night to see if this helps further with sleep. Please let me know how this is working for you.    Preventive Care 20-79 Years Old, Female Preventive care refers to visits with your health care provider and lifestyle choices that can promote health and wellness. This includes:  A yearly physical exam. This may also be called an annual well check.  Regular dental visits and eye exams.  Immunizations.  Screening for certain conditions.  Healthy lifestyle choices, such as eating a healthy diet, getting regular exercise, not using drugs or products that contain nicotine and tobacco, and limiting alcohol use. What can I expect for my preventive care visit? Physical exam Your health care provider will check your:  Height and weight. This may be used to calculate body mass index (BMI), which tells if you are at a healthy weight.  Heart rate and blood pressure.  Skin for abnormal spots. Counseling Your health care provider may ask you questions about your:  Alcohol, tobacco, and drug use.  Emotional well-being.  Home and relationship well-being.  Sexual activity.  Eating habits.  Work and work Statistician.  Method of birth control.  Menstrual cycle.  Pregnancy history. What immunizations do I need?  Influenza (flu) vaccine  This is recommended every year. Tetanus, diphtheria, and pertussis (Tdap) vaccine  You may need a Td booster every 10 years. Varicella (chickenpox) vaccine  You may need this if you have not been vaccinated. Human papillomavirus (HPV) vaccine  If recommended by your  health care provider, you may need three doses over 6 months. Measles, mumps, and rubella (MMR) vaccine  You may need at least one dose of MMR. You may also need a second dose. Meningococcal conjugate (MenACWY) vaccine  One dose is recommended if you are age 29-21 years and a first-year college student living in a residence hall, or if you have one of several medical conditions. You may also need additional booster doses. Pneumococcal conjugate (PCV13) vaccine  You may need this if you have certain conditions and were not previously vaccinated. Pneumococcal polysaccharide (PPSV23) vaccine  You may need one or two doses if you smoke cigarettes or if you have certain conditions. Hepatitis A vaccine  You may need this if you have certain conditions or if you travel or work in places where you may be exposed to hepatitis A. Hepatitis B vaccine  You may need this if you have certain conditions or if you travel or work in places where you may be exposed to hepatitis B. Haemophilus influenzae type b (Hib) vaccine  You may need this if you have certain conditions. You may receive vaccines as individual doses or as more than one vaccine together in one shot (combination vaccines). Talk with your health care provider about the risks and benefits of combination vaccines. What tests do I need?  Blood tests  Lipid and cholesterol levels. These may be checked every 5 years starting at age 46.  Hepatitis C test.  Hepatitis B test. Screening  Diabetes screening. This is done by checking your blood sugar (glucose) after  you have not eaten for a while (fasting).  Sexually transmitted disease (STD) testing.  BRCA-related cancer screening. This may be done if you have a family history of breast, ovarian, tubal, or peritoneal cancers.  Pelvic exam and Pap test. This may be done every 3 years starting at age 67. Starting at age 25, this may be done every 5 years if you have a Pap test in  combination with an HPV test. Talk with your health care provider about your test results, treatment options, and if necessary, the need for more tests. Follow these instructions at home: Eating and drinking   Eat a diet that includes fresh fruits and vegetables, whole grains, lean protein, and low-fat dairy.  Take vitamin and mineral supplements as recommended by your health care provider.  Do not drink alcohol if: ? Your health care provider tells you not to drink. ? You are pregnant, may be pregnant, or are planning to become pregnant.  If you drink alcohol: ? Limit how much you have to 0-1 drink a day. ? Be aware of how much alcohol is in your drink. In the [place] one drink equals one 12 oz bottle of beer (355 mL), one 5 oz glass of wine (148 mL), or one 1 oz glass of hard liquor (44 mL). Lifestyle  Take daily care of your teeth and gums.  Stay active. Exercise for at least 30 minutes on 5 or more days each week.  Do not use any products that contain nicotine or tobacco, such as cigarettes, e-cigarettes, and chewing tobacco. If you need help quitting, ask your health care provider.  If you are sexually active, practice safe sex. Use a condom or other form of birth control (contraception) in order to prevent pregnancy and STIs (sexually transmitted infections). If you plan to become pregnant, see your health care provider for a preconception visit. What's next?  Visit your health care provider once a year for a well check visit.  Ask your health care provider how often you should have your eyes and teeth checked.  Stay up to date on all vaccines. This information is not intended to replace advice given to you by your health care provider. Make sure you discuss any questions you have with your health care provider. Document Revised: 09/23/2017 Document Reviewed: 09/23/2017 Elsevier Patient Education  2020 [employer].

## 2019-10-09 NOTE — Progress Notes (Signed)
Patient presents to clinic today for annual exam.  Patient is fasting for labs.  No current exercise at present due to work schedule. Is followed by [facility] and has lost 20 pounds so far with diet.   Health Maintenance: Immunizations -- UTD PAP -- Followed by GYN. Has upcoming appt scheduled.   Past Medical History:  Diagnosis Date  . Allergy   . Anxiety   . Back pain   . Constipation   . Depression   . Dyspnea   . Gallbladder problem   . GERD (gastroesophageal reflux disease)   . IBS (irritable bowel syndrome)   . Interstitial cystitis   . Lactose intolerance   . Migraine   . Overactive bladder   . Palpitations   . PCOS (polycystic ovarian syndrome)   . Pelvic pain   . Pseudotumor cerebri   . Pseudotumor cerebri   . SUI (stress urinary incontinence, female)   . Urinary incontinence     Past Surgical History:  Procedure Laterality Date  . CESAREAN SECTION  2009  &  2011   2011 W/ BILATERAL TUBAL LIGATION  . CHOLECYSTECTOMY  2010  . CYSTO WITH HYDRODISTENSION  03/01/2012   Procedure: CYSTOSCOPY/HYDRODISTENSION;  Surgeon: Martina Sinner, MD;  Location: [facility];  Service: Urology;  Laterality: N/A;  INSTILLATION of marcaine and pyridium.  . PUBOVAGINAL SLING N/A 02/14/2013   Procedure: PUBO-VAGINAL SLING AND HYDRODISTENSION;  Surgeon: Martina Sinner, MD;  Location: [facility];  Service: Urology;  Laterality: N/A;  . TRANSTHORACIC ECHOCARDIOGRAM  06/2009  . TUBAL LIGATION      Current Outpatient Medications on File Prior to Visit  Medication Sig Dispense Refill  . amitriptyline (ELAVIL) 25 MG tablet TAKE 2 TABLETS BY MOUTH AT BEDTIME 180 tablet 1  . Biotin 5000 MCG CAPS Take 1 capsule by mouth daily.    Marland Kitchen buPROPion (WELLBUTRIN SR) 150 MG 12 hr tablet Take 1 tablet (150 mg total) by mouth 2 (two) times daily. 60 tablet 0  . butalbital-acetaminophen-caffeine (FIORICET, ESGIC) 50-325-40 MG per tablet Take 1  tablet by mouth 2 (two) times daily as needed for headache.    . cetirizine (ZYRTEC) 10 MG tablet Take 10 mg by mouth daily.    . cimetidine (TAGAMET) 200 MG tablet Take 200 mg by mouth 2 (two) times daily.    . diclofenac (VOLTAREN) 50 MG EC tablet Take 50 mg by mouth 2 (two) times daily as needed.    . dicyclomine (BENTYL) 10 MG capsule Take 10 mg by mouth as needed for spasms.    . fluticasone (FLONASE) 50 MCG/ACT nasal spray Place 2 sprays into both nostrils daily. 16 g 6  . Melatonin 10 MG TABS Take 10 mg by mouth at bedtime.     . metFORMIN (GLUCOPHAGE) 500 MG tablet Take 1 tablet (500 mg total) by mouth 2 (two) times daily with a meal. 60 tablet 0  . Multiple Vitamin (MULTIVITAMIN) tablet Take 1 tablet by mouth daily.    . Probiotic Product (PROBIOTIC-10 PO) Take 1 tablet by mouth 2 (two) times daily.     Marland Kitchen spironolactone (ALDACTONE) 100 MG tablet Take 100 mg by mouth daily.  4  . SPRINTEC 28 0.25-35 MG-MCG tablet Take 1 tablet by mouth daily.  4  . temazepam (RESTORIL) 15 MG capsule Take 1 capsule (15 mg total) by mouth at bedtime as needed for sleep. 30 capsule 1  . venlafaxine XR (EFFEXOR-XR) 150 MG 24  hr capsule Take 1 capsule (150 mg total) by mouth daily with breakfast. 90 capsule 1  . SUMAtriptan (IMITREX) 50 MG tablet Take 1 tablet (50 mg total) by mouth once for 1 dose. May repeat in 2 hours if headache persists or recurs. 10 tablet 0   No current facility-administered medications on file prior to visit.    No Known Allergies  Family History  Problem Relation Age of Onset  . Hypertension Mother   . Depression Mother   . Diabetes Mother   . Anxiety disorder Mother   . Sleep apnea Mother   . Obesity Mother   . Hypertension Father   . Cancer Father        kidney  . Depression Father   . Hyperlipidemia Father   . Kidney cancer Father   . Alcoholism Father   . Obesity Father   . Cancer Maternal Uncle        stomach  . Hypertension Maternal Grandmother   . Cancer  Maternal Grandmother        kidney, bone  . Hypertension Maternal Grandfather   . Cancer Maternal Grandfather        lung, bone  . Stroke Maternal Grandfather   . Hypertension Paternal Grandmother   . Hypertension Paternal Grandfather   . Stroke Paternal Grandfather     Social History   Socioeconomic History  . Marital status: Married    Spouse name: Krisna Omar  . Number of children: 2  . Years of education: Not on file  . Highest education level: Not on file  Occupational History  . Occupation: Designer, jewellery    Employer: [employer]  Tobacco Use  . Smoking status: Never Smoker  . Smokeless tobacco: Never Used  Vaping Use  . Vaping Use: Never used  Substance and Sexual Activity  . Alcohol use: No  . Drug use: No  . Sexual activity: Yes    Birth control/protection: Surgical  Other Topics Concern  . Not on file  Social History Narrative  . Not on file   Social Determinants of Health   Financial Resource Strain:   . Difficulty of Paying Living Expenses: Not on file  Food Insecurity:   . Worried About Programme researcher, broadcasting/film/video in the Last Year: Not on file  . Ran Out of Food in the Last Year: Not on file  Transportation Needs:   . Lack of Transportation (Medical): Not on file  . Lack of Transportation (Non-Medical): Not on file  Physical Activity:   . Days of Exercise per Week: Not on file  . Minutes of Exercise per Session: Not on file  Stress:   . Feeling of Stress : Not on file  Social Connections:   . Frequency of Communication with Friends and Family: Not on file  . Frequency of Social Gatherings with Friends and Family: Not on file  . Attends Religious Services: Not on file  . Active Member of Clubs or Organizations: Not on file  . Attends Banker Meetings: Not on file  . Marital Status: Not on file  Intimate Partner Violence:   . Fear of Current or Ex-Partner: Not on file  . Emotionally Abused: Not on file  . Physically Abused: Not on  file  . Sexually Abused: Not on file   Review of Systems  Constitutional: Negative for fever and weight loss.  HENT: Negative for ear discharge, ear pain, hearing loss and tinnitus.   Eyes: Negative for blurred vision, double vision,  photophobia and pain.  Respiratory: Negative for cough and shortness of breath.   Cardiovascular: Negative for chest pain and palpitations.  Gastrointestinal: Negative for abdominal pain, blood in stool, constipation, diarrhea, heartburn, melena, nausea and vomiting.  Genitourinary: Negative for dysuria, flank pain, frequency, hematuria and urgency.  Musculoskeletal: Negative for falls.  Neurological: Negative for dizziness, loss of consciousness and headaches.  Endo/Heme/Allergies: Negative for environmental allergies.  Psychiatric/Behavioral: Negative for depression, hallucinations, substance abuse and suicidal ideas. The patient is not nervous/anxious and does not have insomnia.     BP 120/80   Pulse (!) 106   Temp 99.1 F (37.3 C) (Temporal)   Resp 16   Ht 5\' 5"  (1.651 m)   Wt 265 lb (120.2 kg)   SpO2 98%   BMI 44.10 kg/m   Physical Exam Vitals reviewed.  HENT:     Head: Normocephalic and atraumatic.     Right Ear: Tympanic membrane, ear canal and external ear normal.     Left Ear: Tympanic membrane, ear canal and external ear normal.     Nose: Nose normal. No mucosal edema.     Mouth/Throat:     Pharynx: Uvula midline. No oropharyngeal exudate or posterior oropharyngeal erythema.  Eyes:     Conjunctiva/sclera: Conjunctivae normal.     Pupils: Pupils are equal, round, and reactive to light.  Neck:     Thyroid: No thyromegaly.  Cardiovascular:     Rate and Rhythm: Normal rate and regular rhythm.     Heart sounds: Normal heart sounds.  Pulmonary:     Effort: Pulmonary effort is normal. No respiratory distress.     Breath sounds: Normal breath sounds. No wheezing or rales.  Abdominal:     General: Bowel sounds are normal. There is no  distension.     Palpations: Abdomen is soft. There is no mass.     Tenderness: There is no abdominal tenderness. There is no guarding or rebound.  Musculoskeletal:     Cervical back: Neck supple.  Lymphadenopathy:     Cervical: No cervical adenopathy.  Skin:    General: Skin is warm and dry.     Findings: No rash.  Neurological:     Mental Status: She is alert and oriented to person, place, and time.     Cranial Nerves: No cranial nerve deficit.     Recent Results (from the past 2160 hour(s))  POCT Urinalysis Dipstick (Automated)     Status: None   Collection Time: 09/11/19  4:36 PM  Result Value Ref Range   Color, UA yellow    Clarity, UA cloudy    Glucose, UA Negative Negative   Bilirubin, UA negative    Ketones, UA negative    Spec Grav, UA 1.025 1.010 - 1.025   Blood, UA 2+    pH, UA 6.0 5.0 - 8.0   Protein, UA Negative Negative   Urobilinogen, UA 0.2 0.2 or 1.0 E.U./dL   Nitrite, UA negative    Leukocytes, UA Negative Negative  Lipase, blood     Status: None   Collection Time: 09/17/19  2:03 PM  Result Value Ref Range   Lipase 25 11 - 51 U/L    Comment: Performed at [facility], [address] [place], [place] [id number]  Comprehensive metabolic panel     Status: Abnormal   Collection Time: 09/17/19  2:03 PM  Result Value Ref Range   Sodium 139 135 - 145 mmol/L   Potassium 3.9 3.5 - 5.1  mmol/L   Chloride 105 98 - 111 mmol/L   CO2 24 22 - 32 mmol/L   Glucose, Bld 101 (H) 70 - 99 mg/dL    Comment: Glucose reference range applies only to samples taken after fasting for at least 8 hours.   BUN 10 6 - 20 mg/dL   Creatinine, Ser 3.15 0.44 - 1.00 mg/dL   Calcium 9.1 8.9 - 17.6 mg/dL   Total Protein 6.6 6.5 - 8.1 g/dL   Albumin 3.1 (L) 3.5 - 5.0 g/dL   AST 19 15 - 41 U/L   ALT 21 0 - 44 U/L   Alkaline Phosphatase 51 38 - 126 U/L   Total Bilirubin 0.4 0.3 - 1.2 mg/dL   GFR calc non Af Amer >60 >60 mL/min   GFR calc Af Amer >60 >60 mL/min   Anion gap 10 5 -  15    Comment: Performed at [facility], [address] [place], [place] [address]  CBC     Status: None   Collection Time: 09/17/19  2:03 PM  Result Value Ref Range   WBC 9.8 4.0 - 10.5 K/uL   RBC 4.61 3.87 - 5.11 MIL/uL   Hemoglobin 14.4 12.0 - 15.0 g/dL   HCT 71.0 36 - 46 %   MCV 96.7 80.0 - 100.0 fL   MCH 31.2 26.0 - 34.0 pg   MCHC 32.3 30.0 - 36.0 g/dL   RDW 62.6 94.8 - 54.6 %   Platelets 384 150 - 400 K/uL   nRBC 0.0 0.0 - 0.2 %    Comment: Performed at [facility], [address] [place], [place] [address]  Urinalysis, Routine w reflex microscopic Urine, Clean Catch     Status: Abnormal   Collection Time: 09/17/19  3:45 PM  Result Value Ref Range   Color, Urine YELLOW YELLOW   APPearance HAZY (A) CLEAR   Specific Gravity, Urine 1.023 1.005 - 1.030   pH 5.0 5.0 - 8.0   Glucose, UA NEGATIVE NEGATIVE mg/dL   Hgb urine dipstick LARGE (A) NEGATIVE   Bilirubin Urine NEGATIVE NEGATIVE   Ketones, ur 5 (A) NEGATIVE mg/dL   Protein, ur NEGATIVE NEGATIVE mg/dL   Nitrite NEGATIVE NEGATIVE   Leukocytes,Ua NEGATIVE NEGATIVE   RBC / HPF 11-20 0 - 5 RBC/hpf   WBC, UA 0-5 0 - 5 WBC/hpf   Bacteria, UA NONE SEEN NONE SEEN   Squamous Epithelial / LPF 0-5 0 - 5   Mucus PRESENT     Comment: Performed at [facility], [address], [place], [place] [address]    Assessment/Plan: 1. Visit for preventive health examination Depression screen negative. Health Maintenance reviewed. Preventive schedule discussed and handout given in AVS. Will obtain fasting labs today.  - CBC with Differential/Platelet - Hemoglobin A1c - Lipid panel  2. Recurrent major depressive disorder, in full remission (HCC) Doing well. Continue current medication regimen.   3. Insomnia, unspecified type Improved with Temazepam 15 mg but still could have improvement. Will increase to 22.5 mg daily. Follow-up discussed.   This visit occurred during the SARS-CoV-2 public health emergency.   Safety protocols were in place, including screening questions prior to the visit, additional usage of staff PPE, and extensive cleaning of exam room while observing appropriate contact time as indicated for disinfecting solutions.    Piedad Climes, PA-C

## 2019-10-09 NOTE — Progress Notes (Addendum)
Chief Complaint:   OBESITY [name] is here to discuss her progress with her obesity treatment plan along with follow-up of her obesity related diagnoses. [name] is on following a lower carbohydrate, vegetable and lean protein rich diet plan and states she is following her eating plan approximately 45% of the time. [name] states she has increased activity.   Today's visit was #: 15 Starting weight: 282 lbs Starting date: 11/01/2018 Today's weight: 265 lbs Today's date: 10/09/2019 Total lbs lost to date: 17 Total lbs lost since last in-office visit: 0  Interim History: [name] notes she has been very busy working up to 60 hours per week and she has been unable to pay enough attention to her eating habits.. She has been off of Trulicity because insurance won't cover it. She had been getting Trulicity through a manufacturer assistance program. Her previous prior authorization for Saxenda showed she must try Qsymia before Saxenda. We will try to run this through again.   Subjective:   1. Insulin resistance [name] has stopped Trulicity because insurance will not cover it. She is on metformin. She notes polyphagia and cravings.  2. Other depression, with emotional eating [name] notes cravings and her mood is stable on bupropion and Effexor.  3. At risk for side effect of medication [name] is at risk for drug side effects due to [place].  Assessment/Plan:   1. Insulin resistance  We will refill metformin for 1 month. Dolce agreed to follow-up with [place] as directed to closely monitor her progress.  - metFORMIN (GLUCOPHAGE) 500 MG tablet; Take 1 tablet (500 mg total) by mouth 2 (two) times daily with a meal.  Dispense: 60 tablet; Refill: 0  2. Other depression, with emotional eating  We will refill bupropion for 1 month. Orders and follow up as documented in patient record.   - buPROPion (WELLBUTRIN SR) 150 MG 12 hr tablet; Take 1 tablet (150 mg total) by mouth 2 (two)  times daily.  Dispense: 60 tablet; Refill: 0  3. At risk for side effect of medication [name] was given approximately 15 minutes of drug side effect counseling today. We discussed side effect possibility and risk versus benefits. [name] agreed to the medication and will contact this office if these side effects are intolerable.  Repetitive spaced learning was employed today to elicit superior memory formation and behavioral change.  4. Class 3 severe obesity with serious comorbidity and body mass index (BMI) of 40.0 to 44.9 in adult, unspecified obesity type (HCC) [name] is currently in the action stage of change. As such, her goal is to continue with weight loss efforts. She has agreed to following a lower carbohydrate, vegetable and lean protein rich diet plan.   We discussed various medication options to help [name] with her weight loss efforts and we both agreed to start Saxenda 3.0 mg SubQ daily #5 with no refills (she is to take 0.6 mg SubQ daily). We will try again to get PA for Saxenda. I will have her follow up with an physician so that she can be started on Qsymia if required first by her insurance.  Exercise goals: All adults should avoid inactivity. Some physical activity is better than none, and adults who participate in any amount of physical activity gain some health benefits.  Behavioral modification strategies: decreasing simple carbohydrates and decreasing liquid calories.  [name] has agreed to follow-up with our clinic in 2 to 3 weeks with Dr. Earlene Plater or Dr. Dalbert Garnet.   Objective:   Blood  pressure 113/81, pulse (!) 101, temperature 98.6 F (37 C), temperature source Oral, height 5\' 5"  (1.651 m), weight 265 lb (120.2 kg), SpO2 98 %. Body mass index is 44.1 kg/m.  General: Cooperative, alert, well developed, in no acute distress. HEENT: Conjunctivae and lids unremarkable. Cardiovascular: Regular rhythm.  Lungs: Normal work of breathing. Neurologic: No focal  deficits.   Lab Results  Component Value Date   CREATININE 0.92 09/17/2019   BUN 10 09/17/2019   NA 139 09/17/2019   K 3.9 09/17/2019   CL 105 09/17/2019   CO2 24 09/17/2019   Lab Results  Component Value Date   ALT 21 09/17/2019   AST 19 09/17/2019   ALKPHOS 51 09/17/2019   BILITOT 0.4 09/17/2019   Lab Results  Component Value Date   HGBA1C 5.6 10/09/2019   HGBA1C 5.3 02/02/2019   HGBA1C 5.5 11/01/2018   HGBA1C 5.5 07/06/2017   HGBA1C 5.7 05/05/2016   Lab Results  Component Value Date   INSULIN 25.3 (H) 02/02/2019   INSULIN 23.9 11/01/2018   Lab Results  Component Value Date   TSH 1.820 02/02/2019   Lab Results  Component Value Date   CHOL 115 10/09/2019   HDL 51.60 10/09/2019   LDLCALC 36 10/09/2019   TRIG 140.0 10/09/2019   CHOLHDL 2 10/09/2019   Lab Results  Component Value Date   WBC 9.2 10/09/2019   HGB 13.6 10/09/2019   HCT 40.1 10/09/2019   MCV 93.8 10/09/2019   PLT 370.0 10/09/2019   No results found for: IRON, TIBC, FERRITIN  Attestation Statements:   Reviewed by clinician on day of visit: allergies, medications, problem list, medical history, surgical history, family history, social history, and previous encounter notes.   10/11/2019, am acting as Trude Mcburney for Energy manager, FNP-C.  I have reviewed the above documentation for accuracy and completeness, and I agree with the above. -  [place], FNP

## 2019-10-16 ENCOUNTER — Encounter (INDEPENDENT_AMBULATORY_CARE_PROVIDER_SITE_OTHER): Payer: Self-pay

## 2019-10-20 DIAGNOSIS — Z20828 Contact with and (suspected) exposure to other viral communicable diseases: Secondary | ICD-10-CM | POA: Diagnosis not present

## 2019-10-23 ENCOUNTER — Other Ambulatory Visit: Payer: Self-pay

## 2019-10-23 ENCOUNTER — Encounter (INDEPENDENT_AMBULATORY_CARE_PROVIDER_SITE_OTHER): Payer: Self-pay | Admitting: Family Medicine

## 2019-10-23 ENCOUNTER — Ambulatory Visit (INDEPENDENT_AMBULATORY_CARE_PROVIDER_SITE_OTHER): Payer: BC Managed Care – PPO | Admitting: Family Medicine

## 2019-10-23 VITALS — BP 121/72 | HR 103 | Temp 98.4°F | Ht 65.0 in | Wt 261.0 lb

## 2019-10-23 DIAGNOSIS — E8881 Metabolic syndrome: Secondary | ICD-10-CM

## 2019-10-23 DIAGNOSIS — Z6841 Body Mass Index (BMI) 40.0 and over, adult: Secondary | ICD-10-CM | POA: Diagnosis not present

## 2019-10-23 DIAGNOSIS — E88819 Insulin resistance, unspecified: Secondary | ICD-10-CM

## 2019-10-23 DIAGNOSIS — F3289 Other specified depressive episodes: Secondary | ICD-10-CM | POA: Diagnosis not present

## 2019-10-23 MED ORDER — METFORMIN HCL 500 MG PO TABS: 500.0000 mg | ORAL_TABLET | Freq: Two times a day (BID) | ORAL | 0 refills | Status: DC

## 2019-10-23 MED ORDER — BUPROPION HCL ER (SR) 150 MG PO TB12: 150.0000 mg | ORAL_TABLET | Freq: Two times a day (BID) | ORAL | 0 refills | Status: DC

## 2019-10-23 NOTE — Progress Notes (Signed)
Chief Complaint:   OBESITY [name] is here to discuss her progress with her obesity treatment plan along with follow-up of her obesity related diagnoses. [name] is on following a lower carbohydrate, vegetable and lean protein rich diet plan and states she is following her eating plan approximately 50% of the time. [name] states she is doing 0 minutes 0 times per week.  Today's visit was #: 16 Starting weight: 282 lbs Starting date: 11/01/2018 Today's weight: 261 lbs Today's date: 10/23/2019 Total lbs lost to date: 21 Total lbs lost since last in-office visit: 4  Interim History: [name] is taking Saxenda 1.2 mg daily, and she reports very good appetite suppression. She denies nausea or constipation. She notes she is eating enough even when she doesn't feel like it.  Subjective:   1. Other depression, with emotional eating [name]'s cravings are well controlled, especially now that she is on Saxenda. She is on bupropion 150 mg BID.  2. Insulin resistance [name] has a diagnosis of insulin resistance based on her elevated fasting insulin level >5. She notes her appetite is suppressed due to [place]. She is on metformin BID also.  Lab Results  Component Value Date   INSULIN 25.3 (H) 02/02/2019   INSULIN 23.9 11/01/2018   Lab Results  Component Value Date   HGBA1C 5.6 10/09/2019   Assessment/Plan:   1. Other depression, with emotional eating We will refill bupropion for 1 month.   - buPROPion (WELLBUTRIN SR) 150 MG 12 hr tablet; Take 1 tablet (150 mg total) by mouth 2 (two) times daily.  Dispense: 60 tablet; Refill: 0  2. Insulin resistance Refill metformin for 1 month.  - metFORMIN (GLUCOPHAGE) 500 MG tablet; Take 1 tablet (500 mg total) by mouth 2 (two) times daily with a meal.  Dispense: 60 tablet; Refill: 0  3. Class 3 severe obesity with serious comorbidity and body mass index (BMI) of 40.0 to 44.9 in adult, unspecified obesity type [name]) [name] is  currently in the action stage of change. As such, her goal is to continue with weight loss efforts. She has agreed to the Category 3 Plan.   Exercise goals: [name] is to use her Bowflex twice a week for 15 minutes.  Behavioral modification strategies: increasing lean protein intake.  [name] has agreed to follow-up with our clinic in 2 to 3 weeks.   Objective:   Blood pressure 121/72, pulse (!) 103, temperature 98.4 F (36.9 C), temperature source Oral, height 5\' 5"  (1.651 m), weight 261 lb (118.4 kg), SpO2 93 %. Body mass index is 43.43 kg/m.  General: Cooperative, alert, well developed, in no acute distress. HEENT: Conjunctivae and lids unremarkable. Cardiovascular: Regular rhythm.  Lungs: Normal work of breathing. Neurologic: No focal deficits.   Lab Results  Component Value Date   CREATININE 0.92 09/17/2019   BUN 10 09/17/2019   NA 139 09/17/2019   K 3.9 09/17/2019   CL 105 09/17/2019   CO2 24 09/17/2019   Lab Results  Component Value Date   ALT 21 09/17/2019   AST 19 09/17/2019   ALKPHOS 51 09/17/2019   BILITOT 0.4 09/17/2019   Lab Results  Component Value Date   HGBA1C 5.6 10/09/2019   HGBA1C 5.3 02/02/2019   HGBA1C 5.5 11/01/2018   HGBA1C 5.5 07/06/2017   HGBA1C 5.7 05/05/2016   Lab Results  Component Value Date   INSULIN 25.3 (H) 02/02/2019   INSULIN 23.9 11/01/2018   Lab Results  Component Value Date   TSH 1.820  02/02/2019   Lab Results  Component Value Date   CHOL 115 10/09/2019   HDL 51.60 10/09/2019   LDLCALC 36 10/09/2019   TRIG 140.0 10/09/2019   CHOLHDL 2 10/09/2019   Lab Results  Component Value Date   WBC 9.2 10/09/2019   HGB 13.6 10/09/2019   HCT 40.1 10/09/2019   MCV 93.8 10/09/2019   PLT 370.0 10/09/2019   No results found for: IRON, TIBC, FERRITIN  Attestation Statements:   Reviewed by clinician on day of visit: allergies, medications, problem list, medical history, surgical history, family history, social history, and  previous encounter notes.   Trude Mcburney, am acting as Energy manager for [place], FNP-C.  I have reviewed the above documentation for accuracy and completeness, and I agree with the above. - Jesse Sans, FNP

## 2019-10-24 ENCOUNTER — Encounter (INDEPENDENT_AMBULATORY_CARE_PROVIDER_SITE_OTHER): Payer: Self-pay | Admitting: Family Medicine

## 2019-11-05 DIAGNOSIS — J069 Acute upper respiratory infection, unspecified: Secondary | ICD-10-CM | POA: Diagnosis not present

## 2019-11-05 DIAGNOSIS — R059 Cough, unspecified: Secondary | ICD-10-CM | POA: Diagnosis not present

## 2019-11-05 DIAGNOSIS — Z9189 Other specified personal risk factors, not elsewhere classified: Secondary | ICD-10-CM | POA: Diagnosis not present

## 2019-11-06 ENCOUNTER — Encounter (INDEPENDENT_AMBULATORY_CARE_PROVIDER_SITE_OTHER): Payer: Self-pay | Admitting: Family Medicine

## 2019-11-06 ENCOUNTER — Other Ambulatory Visit: Payer: Self-pay | Admitting: Physician Assistant

## 2019-11-06 ENCOUNTER — Ambulatory Visit (INDEPENDENT_AMBULATORY_CARE_PROVIDER_SITE_OTHER): Payer: BC Managed Care – PPO | Admitting: Family Medicine

## 2019-11-07 NOTE — Telephone Encounter (Signed)
LFD 10/09/19 #30 with no refills LOV 10/09/19 NOV none

## 2019-11-08 ENCOUNTER — Encounter: Payer: Self-pay | Admitting: Physician Assistant

## 2019-11-08 DIAGNOSIS — Z01419 Encounter for gynecological examination (general) (routine) without abnormal findings: Secondary | ICD-10-CM | POA: Diagnosis not present

## 2019-11-08 DIAGNOSIS — F32A Depression, unspecified: Secondary | ICD-10-CM | POA: Diagnosis not present

## 2019-11-08 DIAGNOSIS — E282 Polycystic ovarian syndrome: Secondary | ICD-10-CM | POA: Diagnosis not present

## 2019-11-08 DIAGNOSIS — Z124 Encounter for screening for malignant neoplasm of cervix: Secondary | ICD-10-CM | POA: Diagnosis not present

## 2019-11-09 ENCOUNTER — Other Ambulatory Visit (INDEPENDENT_AMBULATORY_CARE_PROVIDER_SITE_OTHER): Payer: Self-pay | Admitting: Family Medicine

## 2019-11-20 ENCOUNTER — Other Ambulatory Visit: Payer: Self-pay | Admitting: Physician Assistant

## 2019-11-20 DIAGNOSIS — F32A Depression, unspecified: Secondary | ICD-10-CM

## 2019-11-20 DIAGNOSIS — F419 Anxiety disorder, unspecified: Secondary | ICD-10-CM

## 2019-11-26 ENCOUNTER — Telehealth: Payer: BC Managed Care – PPO | Admitting: Family

## 2019-11-26 DIAGNOSIS — M542 Cervicalgia: Secondary | ICD-10-CM | POA: Diagnosis not present

## 2019-11-26 MED ORDER — NAPROXEN 500 MG PO TABS: 500.0000 mg | ORAL_TABLET | Freq: Two times a day (BID) | ORAL | 0 refills | Status: DC

## 2019-11-26 MED ORDER — BACLOFEN 10 MG PO TABS: 10.0000 mg | ORAL_TABLET | Freq: Three times a day (TID) | ORAL | 0 refills | Status: DC

## 2019-11-26 NOTE — Progress Notes (Signed)

## 2019-11-27 ENCOUNTER — Ambulatory Visit (INDEPENDENT_AMBULATORY_CARE_PROVIDER_SITE_OTHER): Payer: BC Managed Care – PPO | Admitting: Family Medicine

## 2019-11-27 ENCOUNTER — Other Ambulatory Visit (INDEPENDENT_AMBULATORY_CARE_PROVIDER_SITE_OTHER): Payer: Self-pay | Admitting: Family Medicine

## 2019-11-27 ENCOUNTER — Other Ambulatory Visit: Payer: Self-pay

## 2019-11-27 ENCOUNTER — Encounter (INDEPENDENT_AMBULATORY_CARE_PROVIDER_SITE_OTHER): Payer: Self-pay | Admitting: Family Medicine

## 2019-11-27 VITALS — BP 125/78 | HR 95 | Temp 98.2°F | Ht 65.0 in | Wt 264.0 lb

## 2019-11-27 DIAGNOSIS — F3289 Other specified depressive episodes: Secondary | ICD-10-CM

## 2019-11-27 DIAGNOSIS — Z6841 Body Mass Index (BMI) 40.0 and over, adult: Secondary | ICD-10-CM | POA: Diagnosis not present

## 2019-11-27 DIAGNOSIS — E66813 Obesity, class 3: Secondary | ICD-10-CM

## 2019-11-27 DIAGNOSIS — E8881 Metabolic syndrome: Secondary | ICD-10-CM

## 2019-11-27 DIAGNOSIS — Z9189 Other specified personal risk factors, not elsewhere classified: Secondary | ICD-10-CM

## 2019-11-27 DIAGNOSIS — E88819 Insulin resistance, unspecified: Secondary | ICD-10-CM

## 2019-11-27 MED ORDER — BD PEN NEEDLE NANO 2ND GEN 32G X 4 MM MISC: 0 refills | Status: DC

## 2019-11-27 MED ORDER — BUPROPION HCL ER (SR) 150 MG PO TB12: 150.0000 mg | ORAL_TABLET | Freq: Two times a day (BID) | ORAL | 0 refills | Status: DC

## 2019-11-27 MED ORDER — METFORMIN HCL 500 MG PO TABS: 500.0000 mg | ORAL_TABLET | Freq: Two times a day (BID) | ORAL | 0 refills | Status: DC

## 2019-11-27 NOTE — Progress Notes (Signed)
Chief Complaint:   OBESITY [name] is here to discuss her progress with her obesity treatment plan along with follow-up of her obesity related diagnoses. [name] is on the Category 3 Plan and states she is following her eating plan approximately 50% of the time. [name] states she is doing 0 minutes 0 times per week.  Today's visit was #: 17 Starting weight: 282 lbs Starting date: 11/01/2018 Today's weight: 264 lbs Today's date: 11/27/2019 Total lbs lost to date: 18 Total lbs lost since last in-office visit: 0  Interim History: [name] is on 1.5 mg Saxenda now. She had some dizziness when she had increased her dose of Saxenda to 1.8 mg as directed so she backed back down to 1.5. She notes gas but no dizziness at the 1.5 mg dose.. She has been sick with an upper respiratory infection, but she is now better. She skipped many meals while being sick. She overate candy at work also.  Subjective:   1. Insulin resistance [name] has a diagnosis of insulin resistance based on her elevated fasting insulin level >5. She denies polyphagia, and she is on metformin and Saxenda.   Lab Results  Component Value Date   INSULIN 25.3 (H) 02/02/2019   INSULIN 23.9 11/01/2018   Lab Results  Component Value Date   HGBA1C 5.6 10/09/2019   2. Other depression, with emotional eating [name] feels bupropion helps with cravings. She is also on effexor. Mood is stable overall. 3. At risk for side effect of medication [name] is at risk for drug side effects due to increased dose of Saxenda.  Assessment/Plan:   1. Insulin resistance  We will refill metformin for 1 month.- metFORMIN (GLUCOPHAGE) 500 MG tablet; Take 1 tablet (500 mg total) by mouth 2 (two) times daily with a meal.  Dispense: 60 tablet; Refill: 0  2. Other depression, with emotional eating  We will refill bupropion for 1 month.   - buPROPion (WELLBUTRIN SR) 150 MG 12 hr tablet; Take 1 tablet (150 mg total) by mouth 2 (two)  times daily.  Dispense: 60 tablet; Refill: 0  3. At risk for side effect of medication [name] was given approximately 15 minutes of drug side effect counseling today.  We discussed side effect possibility and risk versus benefits. [name] agreed to the medication and will contact this office if these side effects are intolerable.  Repetitive spaced learning was employed today to elicit superior memory formation and behavioral change.  4. Class 3 severe obesity with serious comorbidity and body mass index (BMI) of 40.0 to 44.9 in adult, unspecified obesity type (HCC) [name] is currently in the action stage of change. As such, her goal is to continue with weight loss efforts. She has agreed to keeping a food journal and adhering to recommended goals of 1700-1800 calories and 110 grams of protein daily.   She may increase Saxenda to 1.8 mg if needed to optimize appetite control/satiety. We will refill nano needles #100 with no refills.  - Insulin Pen Needle (BD PEN NEEDLE NANO 2ND GEN) 32G X 4 MM MISC; Use 1 needle daily to inject Saxenda.  Dispense: 100 each; Refill: 0  Exercise goals: No exercise has been prescribed at this time.  Behavioral modification strategies: increasing lean protein intake and decreasing simple carbohydrates.  [name] has agreed to follow-up with our clinic in 3 weeks.   Objective:   Blood pressure 125/78, pulse 95, temperature 98.2 F (36.8 C), height 5\' 5"  (1.651 m), weight 264 lb (119.7  kg), SpO2 98 %. Body mass index is 43.93 kg/m.  General: Cooperative, alert, well developed, in no acute distress. HEENT: Conjunctivae and lids unremarkable. Cardiovascular: Regular rhythm.  Lungs: Normal work of breathing. Neurologic: No focal deficits.   Lab Results  Component Value Date   CREATININE 0.92 09/17/2019   BUN 10 09/17/2019   NA 139 09/17/2019   K 3.9 09/17/2019   CL 105 09/17/2019   CO2 24 09/17/2019   Lab Results  Component Value Date   ALT 21  09/17/2019   AST 19 09/17/2019   ALKPHOS 51 09/17/2019   BILITOT 0.4 09/17/2019   Lab Results  Component Value Date   HGBA1C 5.6 10/09/2019   HGBA1C 5.3 02/02/2019   HGBA1C 5.5 11/01/2018   HGBA1C 5.5 07/06/2017   HGBA1C 5.7 05/05/2016   Lab Results  Component Value Date   INSULIN 25.3 (H) 02/02/2019   INSULIN 23.9 11/01/2018   Lab Results  Component Value Date   TSH 1.820 02/02/2019   Lab Results  Component Value Date   CHOL 115 10/09/2019   HDL 51.60 10/09/2019   LDLCALC 36 10/09/2019   TRIG 140.0 10/09/2019   CHOLHDL 2 10/09/2019   Lab Results  Component Value Date   WBC 9.2 10/09/2019   HGB 13.6 10/09/2019   HCT 40.1 10/09/2019   MCV 93.8 10/09/2019   PLT 370.0 10/09/2019   No results found for: IRON, TIBC, FERRITIN  Attestation Statements:   Reviewed by clinician on day of visit: allergies, medications, problem list, medical history, surgical history, family history, social history, and previous encounter notes.   Trude Mcburney, am acting as Energy manager for [place], FNP-C.  I have reviewed the above documentation for accuracy and completeness, and I agree with the above. -  Jesse Sans, FNP

## 2019-11-27 NOTE — Telephone Encounter (Signed)
[name] pt °

## 2019-11-28 ENCOUNTER — Encounter (INDEPENDENT_AMBULATORY_CARE_PROVIDER_SITE_OTHER): Payer: Self-pay | Admitting: Family Medicine

## 2019-11-30 ENCOUNTER — Other Ambulatory Visit (INDEPENDENT_AMBULATORY_CARE_PROVIDER_SITE_OTHER): Payer: Self-pay | Admitting: Family Medicine

## 2019-11-30 DIAGNOSIS — F3289 Other specified depressive episodes: Secondary | ICD-10-CM

## 2019-11-30 DIAGNOSIS — E8881 Metabolic syndrome: Secondary | ICD-10-CM

## 2019-12-10 ENCOUNTER — Other Ambulatory Visit (INDEPENDENT_AMBULATORY_CARE_PROVIDER_SITE_OTHER): Payer: Self-pay | Admitting: Family Medicine

## 2019-12-11 ENCOUNTER — Other Ambulatory Visit (INDEPENDENT_AMBULATORY_CARE_PROVIDER_SITE_OTHER): Payer: Self-pay | Admitting: Family Medicine

## 2019-12-11 DIAGNOSIS — Z6841 Body Mass Index (BMI) 40.0 and over, adult: Secondary | ICD-10-CM

## 2019-12-11 MED ORDER — SAXENDA 18 MG/3ML ~~LOC~~ SOPN: 3.0000 mg | PEN_INJECTOR | Freq: Every day | SUBCUTANEOUS | 0 refills | Status: DC

## 2019-12-11 NOTE — Telephone Encounter (Signed)
Refill request

## 2019-12-11 NOTE — Telephone Encounter (Signed)
Last OV with Dawn 

## 2019-12-18 ENCOUNTER — Ambulatory Visit (INDEPENDENT_AMBULATORY_CARE_PROVIDER_SITE_OTHER): Payer: BC Managed Care – PPO | Admitting: Family Medicine

## 2019-12-18 ENCOUNTER — Encounter (INDEPENDENT_AMBULATORY_CARE_PROVIDER_SITE_OTHER): Payer: Self-pay | Admitting: Family Medicine

## 2019-12-18 ENCOUNTER — Other Ambulatory Visit: Payer: Self-pay

## 2019-12-18 VITALS — BP 115/73 | HR 100 | Temp 98.1°F | Ht 65.0 in | Wt 261.0 lb

## 2019-12-18 DIAGNOSIS — Z6841 Body Mass Index (BMI) 40.0 and over, adult: Secondary | ICD-10-CM

## 2019-12-18 DIAGNOSIS — E66813 Obesity, class 3: Secondary | ICD-10-CM

## 2019-12-18 DIAGNOSIS — F3289 Other specified depressive episodes: Secondary | ICD-10-CM

## 2019-12-18 DIAGNOSIS — Z9189 Other specified personal risk factors, not elsewhere classified: Secondary | ICD-10-CM | POA: Diagnosis not present

## 2019-12-18 DIAGNOSIS — E8881 Metabolic syndrome: Secondary | ICD-10-CM

## 2019-12-18 DIAGNOSIS — E88819 Insulin resistance, unspecified: Secondary | ICD-10-CM

## 2019-12-18 MED ORDER — METFORMIN HCL 500 MG PO TABS: 500.0000 mg | ORAL_TABLET | Freq: Two times a day (BID) | ORAL | 0 refills | Status: DC

## 2019-12-18 MED ORDER — BUPROPION HCL ER (SR) 200 MG PO TB12: 200.0000 mg | ORAL_TABLET | Freq: Two times a day (BID) | ORAL | 0 refills | Status: DC

## 2019-12-18 MED ORDER — METFORMIN HCL 500 MG PO TABS
500.0000 mg | ORAL_TABLET | Freq: Two times a day (BID) | ORAL | 0 refills | Status: DC
Start: 2019-12-18 — End: 2019-12-18

## 2019-12-19 NOTE — Progress Notes (Signed)
Chief Complaint:   OBESITY [name] is here to discuss her progress with her obesity treatment plan along with follow-up of her obesity related diagnoses. [name] is on keeping a food journal and adhering to recommended goals of 1700-1800 calories and 110 grams of protein daily and states she is following her eating plan approximately 50% of the time. [name] states she is doing 0 minutes 0 times per week.  Today's visit was #: 18 Starting weight: 282 lbs Starting date: 11/01/2018 Today's weight: 261 lbs Today's date: 12/18/2019 Total lbs lost to date: 21 Total lbs lost since last in-office visit: 3  Interim History: [name] is doing [employer] and she is using the app. She is struggling with snacking on unhealthy foods at work even when she has brought healthy food to eat. She is on Saxenda 1.8 mg daily. She denies excessive hunger. She is stressed over abnormal pap.  Subjective:   1. Insulin resistance [name] is on metformin BID and Saxenda, and she denies polyphagia. Last A1c was 5.6.   Lab Results  Component Value Date   INSULIN 25.3 (H) 02/02/2019   INSULIN 23.9 11/01/2018   Lab Results  Component Value Date   HGBA1C 5.6 10/09/2019   2. Other depression, with emotional eating [name] notes increased emotional eating recently due to abnormal pap.  3. At risk for side effect of medication [name] is at risk for drug side effects due to increased dose of Saxenda.  Assessment/Plan:   1. Insulin resistance  We will refill metformin for 1 month. [name] agreed to follow-up with [place] as directed to closely monitor her progress.  - metFORMIN (GLUCOPHAGE) 500 MG tablet; Take 1 tablet (500 mg total) by mouth 2 (two) times daily with a meal.  Dispense: 60 tablet; Refill: 0  2. Other depression, with emotional eating  [name] agreed to increase bupropion 200 mg BID with no refills. She is to take 200 mg in the AM and 150 mg in the PM for one week and then 200 mg  BID. Orders and follow up as documented in patient record.   - buPROPion (WELLBUTRIN SR) 200 MG 12 hr tablet; Take 1 tablet (200 mg total) by mouth 2 (two) times daily.  Dispense: 60 tablet; Refill: 0  3. At risk for side effect of medication [name] was given approximately 15 minutes of drug side effect counseling today. We discussed side effect possibility and risk versus benefits. [name] agreed to the medication and will contact this office if these side effects are intolerable.  Repetitive spaced learning was employed today to elicit superior memory formation and behavioral change.  4. Class 3 severe obesity with serious comorbidity and body mass index (BMI) of 40.0 to 44.9 in adult, unspecified obesity type (HCC) [name] is currently in the action stage of change. As such, her goal is to continue with weight loss efforts. She has agreed to practicing portion control and making smarter food choices, such as increasing vegetables and decreasing simple carbohydrates (Weight Watchers).   We discussed various medication options to help [name] with her weight loss efforts and we both agreed to increase Saxenda to 2.1 (1.8 plus 5 clicks) mg daily.  Exercise goals: All adults should avoid inactivity. Some physical activity is better than none, and adults who participate in any amount of physical activity gain some health benefits.  Behavioral modification strategies: increasing lean protein intake and no skipping meals.  [name] has agreed to follow-up with our clinic in 3 weeks.  Objective:   Blood pressure 115/73, pulse 100, temperature 98.1 F (36.7 C), height 5\' 5"  (1.651 m), weight 261 lb (118.4 kg), SpO2 96 %. Body mass index is 43.43 kg/m.  General: Cooperative, alert, well developed, in no acute distress. HEENT: Conjunctivae and lids unremarkable. Cardiovascular: Regular rhythm.  Lungs: Normal work of breathing. Neurologic: No focal deficits.   Lab Results  Component  Value Date   CREATININE 0.92 09/17/2019   BUN 10 09/17/2019   NA 139 09/17/2019   K 3.9 09/17/2019   CL 105 09/17/2019   CO2 24 09/17/2019   Lab Results  Component Value Date   ALT 21 09/17/2019   AST 19 09/17/2019   ALKPHOS 51 09/17/2019   BILITOT 0.4 09/17/2019   Lab Results  Component Value Date   HGBA1C 5.6 10/09/2019   HGBA1C 5.3 02/02/2019   HGBA1C 5.5 11/01/2018   HGBA1C 5.5 07/06/2017   HGBA1C 5.7 05/05/2016   Lab Results  Component Value Date   INSULIN 25.3 (H) 02/02/2019   INSULIN 23.9 11/01/2018   Lab Results  Component Value Date   TSH 1.820 02/02/2019   Lab Results  Component Value Date   CHOL 115 10/09/2019   HDL 51.60 10/09/2019   LDLCALC 36 10/09/2019   TRIG 140.0 10/09/2019   CHOLHDL 2 10/09/2019   Lab Results  Component Value Date   WBC 9.2 10/09/2019   HGB 13.6 10/09/2019   HCT 40.1 10/09/2019   MCV 93.8 10/09/2019   PLT 370.0 10/09/2019   No results found for: IRON, TIBC, FERRITIN  Attestation Statements:   Reviewed by clinician on day of visit: allergies, medications, problem list, medical history, surgical history, family history, social history, and previous encounter notes.   10/11/2019, am acting as Trude Mcburney for Energy manager, FNP-C.  I have reviewed the above documentation for accuracy and completeness, and I agree with the above. -  [place], FNP

## 2019-12-20 ENCOUNTER — Encounter (INDEPENDENT_AMBULATORY_CARE_PROVIDER_SITE_OTHER): Payer: Self-pay | Admitting: Family Medicine

## 2019-12-20 DIAGNOSIS — N890 Mild vaginal dysplasia: Secondary | ICD-10-CM | POA: Diagnosis not present

## 2019-12-20 DIAGNOSIS — R87810 Cervical high risk human papillomavirus (HPV) DNA test positive: Secondary | ICD-10-CM | POA: Diagnosis not present

## 2019-12-20 DIAGNOSIS — R87610 Atypical squamous cells of undetermined significance on cytologic smear of cervix (ASC-US): Secondary | ICD-10-CM | POA: Diagnosis not present

## 2019-12-30 ENCOUNTER — Other Ambulatory Visit (INDEPENDENT_AMBULATORY_CARE_PROVIDER_SITE_OTHER): Payer: Self-pay | Admitting: Family Medicine

## 2019-12-30 DIAGNOSIS — F3289 Other specified depressive episodes: Secondary | ICD-10-CM

## 2020-01-01 ENCOUNTER — Other Ambulatory Visit: Payer: Self-pay | Admitting: Physician Assistant

## 2020-01-01 NOTE — Telephone Encounter (Signed)
This patient was last seen by Adah Salvage, FNP, and currently has an upcoming appt scheduled on 01/08/20 with her.

## 2020-01-02 ENCOUNTER — Other Ambulatory Visit: Payer: Self-pay | Admitting: Physician Assistant

## 2020-01-02 ENCOUNTER — Encounter: Payer: Self-pay | Admitting: Physician Assistant

## 2020-01-02 NOTE — Telephone Encounter (Signed)
LFD 11/08/19 #30 with 1 refill LOV 10/09/19 NOV none

## 2020-01-07 ENCOUNTER — Other Ambulatory Visit (INDEPENDENT_AMBULATORY_CARE_PROVIDER_SITE_OTHER): Payer: Self-pay | Admitting: Family Medicine

## 2020-01-07 DIAGNOSIS — Z6841 Body Mass Index (BMI) 40.0 and over, adult: Secondary | ICD-10-CM

## 2020-01-08 ENCOUNTER — Other Ambulatory Visit: Payer: Self-pay

## 2020-01-08 ENCOUNTER — Encounter (INDEPENDENT_AMBULATORY_CARE_PROVIDER_SITE_OTHER): Payer: Self-pay | Admitting: Family Medicine

## 2020-01-08 ENCOUNTER — Ambulatory Visit (INDEPENDENT_AMBULATORY_CARE_PROVIDER_SITE_OTHER): Payer: BC Managed Care – PPO | Admitting: Family Medicine

## 2020-01-08 VITALS — BP 127/83 | HR 101 | Temp 97.8°F | Ht 65.0 in | Wt 260.0 lb

## 2020-01-08 DIAGNOSIS — E88819 Insulin resistance, unspecified: Secondary | ICD-10-CM

## 2020-01-08 DIAGNOSIS — F3289 Other specified depressive episodes: Secondary | ICD-10-CM

## 2020-01-08 DIAGNOSIS — E8881 Metabolic syndrome: Secondary | ICD-10-CM | POA: Diagnosis not present

## 2020-01-08 DIAGNOSIS — Z6841 Body Mass Index (BMI) 40.0 and over, adult: Secondary | ICD-10-CM

## 2020-01-08 MED ORDER — METFORMIN HCL 500 MG PO TABS: 500.0000 mg | ORAL_TABLET | Freq: Two times a day (BID) | ORAL | 0 refills | Status: DC

## 2020-01-08 MED ORDER — SAXENDA 18 MG/3ML ~~LOC~~ SOPN: 3.0000 mg | PEN_INJECTOR | Freq: Every day | SUBCUTANEOUS | 0 refills | Status: DC

## 2020-01-08 MED ORDER — BUPROPION HCL ER (SR) 200 MG PO TB12: 200.0000 mg | ORAL_TABLET | Freq: Two times a day (BID) | ORAL | 0 refills | Status: DC

## 2020-01-08 NOTE — Progress Notes (Signed)
Chief Complaint:   OBESITY [name] is here to discuss her progress with her obesity treatment plan along with follow-up of her obesity related diagnoses. [name] is on practicing portion control and making smarter food choices, such as increasing vegetables and decreasing simple carbohydrates (Weight Watchers) and states she is following her eating plan approximately 75% of the time. [name] states she is doing 0 minutes 0 times per week.  Today's visit was #: 19 Starting weight: 282 lbs Starting date: 11/01/2018 Today's weight: 260 lbs Today's date: 01/08/2020 Total lbs lost to date: 22 Total lbs lost since last in-office visit: 1  Interim History: [name] reports she is struggling due to indulgent food availability because of the holidays.  However, she is down 1 lbs today. She is currently taking 2.4 mg of Saxenda. She is trying to get her protein in. Appetite and cravings are well controlled. She is no longer getting food from the vending machines at work.   Subjective:   1. Insulin resistance [name] denies polyphagia on Saxenda 2.4 mg daily as well as metformin BID.   Lab Results  Component Value Date   INSULIN 25.3 (H) 02/02/2019   INSULIN 23.9 11/01/2018   Lab Results  Component Value Date   HGBA1C 5.6 10/09/2019   2. Other depression, with emotional eating [name] notes dry mouth. She denies cravings. Mood is stable. She is on bupropion 200 mg BID.  Assessment/Plan:   1. Insulin resistance  We will refill metformin for 1 month. [name] agreed to follow-up with [place] as directed to closely monitor her progress.  - metFORMIN (GLUCOPHAGE) 500 MG tablet; Take 1 tablet (500 mg total) by mouth 2 (two) times daily with a meal.  Dispense: 60 tablet; Refill: 0  2. Other depression, with emotional eating . We will refill bupropion for 1 month. Orders and follow up as documented in patient record.   - buPROPion (WELLBUTRIN SR) 200 MG 12 hr tablet; Take 1 tablet (200  mg total) by mouth 2 (two) times daily.  Dispense: 60 tablet; Refill: 0  3. Class 3 severe obesity with serious comorbidity and body mass index (BMI) of 40.0 to 44.9 in adult, unspecified obesity type [name]) [name] is currently in the action stage of change. As such, her goal is to continue with weight loss efforts. She has agreed to keeping a food journal and adhering to recommended goals of 1800-1900 calories and 100 grams of protein daily.   We discussed various medication options to help [name] with her weight loss efforts and we both agreed to continue Saxenda at 2.4 mg daily, and we will refill for 1 month.  - Liraglutide -Weight Management (SAXENDA) 18 MG/3ML SOPN; Inject 3 mg into the skin daily.  Dispense: 15 mL; Refill: 0  Exercise goals: All adults should avoid inactivity. Some physical activity is better than none, and adults who participate in any amount of physical activity gain some health benefits.  Behavioral modification strategies: decreasing simple carbohydrates and planning for success.  [name] has agreed to follow-up with our clinic in 3 weeks.   Objective:   Blood pressure 127/83, pulse (!) 101, temperature 97.8 F (36.6 C), height 5\' 5"  (1.651 m), weight 260 lb (117.9 kg), SpO2 97 %. Body mass index is 43.27 kg/m.  General: Cooperative, alert, well developed, in no acute distress. HEENT: Conjunctivae and lids unremarkable. Cardiovascular: Regular rhythm.  Lungs: Normal work of breathing. Neurologic: No focal deficits.   Lab Results  Component Value Date  CREATININE 0.92 09/17/2019   BUN 10 09/17/2019   NA 139 09/17/2019   K 3.9 09/17/2019   CL 105 09/17/2019   CO2 24 09/17/2019   Lab Results  Component Value Date   ALT 21 09/17/2019   AST 19 09/17/2019   ALKPHOS 51 09/17/2019   BILITOT 0.4 09/17/2019   Lab Results  Component Value Date   HGBA1C 5.6 10/09/2019   HGBA1C 5.3 02/02/2019   HGBA1C 5.5 11/01/2018   HGBA1C 5.5 07/06/2017    HGBA1C 5.7 05/05/2016   Lab Results  Component Value Date   INSULIN 25.3 (H) 02/02/2019   INSULIN 23.9 11/01/2018   Lab Results  Component Value Date   TSH 1.820 02/02/2019   Lab Results  Component Value Date   CHOL 115 10/09/2019   HDL 51.60 10/09/2019   LDLCALC 36 10/09/2019   TRIG 140.0 10/09/2019   CHOLHDL 2 10/09/2019   Lab Results  Component Value Date   WBC 9.2 10/09/2019   HGB 13.6 10/09/2019   HCT 40.1 10/09/2019   MCV 93.8 10/09/2019   PLT 370.0 10/09/2019   No results found for: IRON, TIBC, FERRITIN  Attestation Statements:   Reviewed by clinician on day of visit: allergies, medications, problem list, medical history, surgical history, family history, social history, and previous encounter notes.   Trude Mcburney, am acting as Energy manager for [place], FNP-C.  I have reviewed the above documentation for accuracy and completeness, and I agree with the above. -  Jesse Sans, FNP

## 2020-01-08 NOTE — Telephone Encounter (Signed)
This patient was last seen by Adah Salvage, FNP and currently has an upcoming appt scheduled on 01/08/20 with her.

## 2020-01-15 DIAGNOSIS — Z20828 Contact with and (suspected) exposure to other viral communicable diseases: Secondary | ICD-10-CM | POA: Diagnosis not present

## 2020-01-25 ENCOUNTER — Other Ambulatory Visit: Payer: Self-pay | Admitting: Physician Assistant

## 2020-01-25 DIAGNOSIS — G4700 Insomnia, unspecified: Secondary | ICD-10-CM

## 2020-01-29 ENCOUNTER — Ambulatory Visit (INDEPENDENT_AMBULATORY_CARE_PROVIDER_SITE_OTHER): Payer: BC Managed Care – PPO | Admitting: Family Medicine

## 2020-01-29 ENCOUNTER — Encounter (INDEPENDENT_AMBULATORY_CARE_PROVIDER_SITE_OTHER): Payer: Self-pay | Admitting: Family Medicine

## 2020-01-29 ENCOUNTER — Other Ambulatory Visit: Payer: Self-pay

## 2020-01-29 VITALS — BP 115/74 | HR 104 | Temp 98.6°F | Ht 65.0 in | Wt 258.0 lb

## 2020-01-29 DIAGNOSIS — E8881 Metabolic syndrome: Secondary | ICD-10-CM

## 2020-01-29 DIAGNOSIS — Z6841 Body Mass Index (BMI) 40.0 and over, adult: Secondary | ICD-10-CM | POA: Diagnosis not present

## 2020-01-29 DIAGNOSIS — F3289 Other specified depressive episodes: Secondary | ICD-10-CM | POA: Diagnosis not present

## 2020-01-29 DIAGNOSIS — E88819 Insulin resistance, unspecified: Secondary | ICD-10-CM

## 2020-01-29 MED ORDER — BD PEN NEEDLE NANO 2ND GEN 32G X 4 MM MISC: 0 refills | Status: DC

## 2020-01-29 MED ORDER — BUPROPION HCL ER (SR) 200 MG PO TB12: 200.0000 mg | ORAL_TABLET | Freq: Two times a day (BID) | ORAL | 0 refills | Status: DC

## 2020-01-29 MED ORDER — SAXENDA 18 MG/3ML ~~LOC~~ SOPN: 3.0000 mg | PEN_INJECTOR | Freq: Every day | SUBCUTANEOUS | 0 refills | Status: DC

## 2020-01-29 MED ORDER — METFORMIN HCL 500 MG PO TABS: 500.0000 mg | ORAL_TABLET | Freq: Two times a day (BID) | ORAL | 0 refills | Status: DC

## 2020-01-29 NOTE — Telephone Encounter (Signed)
Temazepam LFD 01/02/20 #30 with no refills LOV 10/09/19 NOV none

## 2020-01-30 ENCOUNTER — Encounter: Payer: Self-pay | Admitting: Physician Assistant

## 2020-01-31 ENCOUNTER — Encounter (INDEPENDENT_AMBULATORY_CARE_PROVIDER_SITE_OTHER): Payer: Self-pay | Admitting: Family Medicine

## 2020-01-31 NOTE — Progress Notes (Signed)
Chief Complaint:   OBESITY [name] is here to discuss her progress with her obesity treatment plan along with follow-up of her obesity related diagnoses. Tyishia is on practicing portion control and making smarter food choices, such as increasing vegetables and decreasing simple carbohydrates and states she is following her eating plan approximately 60% of the time. Shital states she is not exercising 0 minutes 0 times per week.  Today's visit was #: 20 Starting weight: 282 lbs Starting date: 11/01/2018 Today's weight: 258 lbs Today's date: 01/29/2020 Total lbs lost to date: 24 lbs Total lbs lost since last in-office visit: 2 lbs  Interim History: [name] is surprised she has lost weight. She is on Saxenda 2.4 mg daily. She noted an increase in mild nausea when dose of Saxenda was increased. [name] notes she has increased snacking between meals. She is not tracking her intake.  Subjective:   1. Insulin resistance  Deshanae denies polyphagia. She is on metformin and Saxenda.   Lab Results  Component Value Date   HGBA1C 5.6 10/09/2019   HGBA1C 5.3 02/02/2019   HGBA1C 5.5 11/01/2018   Lab Results  Component Value Date   LDLCALC 36 10/09/2019   CREATININE 0.92 09/17/2019   Lab Results  Component Value Date   INSULIN 25.3 (H) 02/02/2019   INSULIN 23.9 11/01/2018    2. Other depression, with emotional eating [name] only notes cravings with menses. Mood is stable and dry mouth has improved.   Assessment/Plan:   1. Insulin resistance Continue meal plan.  - metFORMIN (GLUCOPHAGE) 500 MG tablet; Take 1 tablet (500 mg total) by mouth 2 (two) times daily with a meal.  Dispense: 60 tablet; Refill: 0  2. Other depression, with emotional eating Behavior modification techniques were discussed today to help [name] deal with her emotional/non-hunger eating behaviors.   - buPROPion (WELLBUTRIN SR) 200 MG 12 hr tablet; Take 1 tablet (200 mg total) by mouth 2 (two) times  daily.  Dispense: 60 tablet; Refill: 0  3. Class 3 severe obesity with serious comorbidity and body mass index (BMI) of 40.0 to 44.9 in adult, unspecified obesity type [facility])   [name] is currently in the action stage of change. As such, her goal is to continue with weight loss efforts. She has agreed to practicing portion control and making smarter food choices, such as increasing vegetables and decreasing simple carbohydrates or 1800-1900 calories and 100+grams of protein daily.    - Liraglutide -Weight Management (SAXENDA) 18 MG/3ML SOPN; Inject 3 mg into the skin daily.  Dispense: 15 mL; Refill: 0 - Insulin Pen Needle (BD PEN NEEDLE NANO 2ND GEN) 32G X 4 MM MISC; Use 1 needle daily to inject Saxenda.  Dispense: 100 each; Refill: 0   Exercise goals: All adults should avoid inactivity. Some physical activity is better than none, and adults who participate in any amount of physical activity gain some health benefits.  Behavioral modification strategies: decreasing simple carbohydrates.  [name] has agreed to follow-up with our clinic in 3 weeks.   Objective:   Blood pressure 115/74, pulse (!) 104, temperature 98.6 F (37 C), height 5\' 5"  (1.651 m), weight 258 lb (117 kg), SpO2 97 %. Body mass index is 42.93 kg/m.  General: Cooperative, alert, well developed, in no acute distress. HEENT: Conjunctivae and lids unremarkable. Cardiovascular: Regular rhythm.  Lungs: Normal work of breathing. Neurologic: No focal deficits.   Lab Results  Component Value Date   CREATININE 0.92 09/17/2019   BUN 10 09/17/2019  NA 139 09/17/2019   K 3.9 09/17/2019   CL 105 09/17/2019   CO2 24 09/17/2019   Lab Results  Component Value Date   ALT 21 09/17/2019   AST 19 09/17/2019   ALKPHOS 51 09/17/2019   BILITOT 0.4 09/17/2019   Lab Results  Component Value Date   HGBA1C 5.6 10/09/2019   HGBA1C 5.3 02/02/2019   HGBA1C 5.5 11/01/2018   HGBA1C 5.5 07/06/2017   HGBA1C 5.7 05/05/2016   Lab  Results  Component Value Date   INSULIN 25.3 (H) 02/02/2019   INSULIN 23.9 11/01/2018   Lab Results  Component Value Date   TSH 1.820 02/02/2019   Lab Results  Component Value Date   CHOL 115 10/09/2019   HDL 51.60 10/09/2019   LDLCALC 36 10/09/2019   TRIG 140.0 10/09/2019   CHOLHDL 2 10/09/2019   Lab Results  Component Value Date   WBC 9.2 10/09/2019   HGB 13.6 10/09/2019   HCT 40.1 10/09/2019   MCV 93.8 10/09/2019   PLT 370.0 10/09/2019   No results found for: IRON, TIBC, FERRITIN   I, [name], am acting as Energy manager for [employer], FNP  I have reviewed the above documentation for accuracy and completeness, and I agree with the above. -  Jesse Sans, FNP

## 2020-02-05 DIAGNOSIS — Z1152 Encounter for screening for COVID-19: Secondary | ICD-10-CM | POA: Diagnosis not present

## 2020-02-08 ENCOUNTER — Other Ambulatory Visit (INDEPENDENT_AMBULATORY_CARE_PROVIDER_SITE_OTHER): Payer: Self-pay | Admitting: Family Medicine

## 2020-02-08 DIAGNOSIS — Z6841 Body Mass Index (BMI) 40.0 and over, adult: Secondary | ICD-10-CM

## 2020-02-08 NOTE — Telephone Encounter (Signed)
Last OV with Dawn 

## 2020-02-09 ENCOUNTER — Other Ambulatory Visit (INDEPENDENT_AMBULATORY_CARE_PROVIDER_SITE_OTHER): Payer: Self-pay | Admitting: Family Medicine

## 2020-02-09 ENCOUNTER — Encounter (INDEPENDENT_AMBULATORY_CARE_PROVIDER_SITE_OTHER): Payer: Self-pay

## 2020-02-09 DIAGNOSIS — F3289 Other specified depressive episodes: Secondary | ICD-10-CM

## 2020-02-12 NOTE — Telephone Encounter (Signed)
Last OV with Dawn 

## 2020-02-14 ENCOUNTER — Encounter: Payer: Self-pay | Admitting: Physician Assistant

## 2020-02-26 ENCOUNTER — Other Ambulatory Visit: Payer: Self-pay

## 2020-02-26 ENCOUNTER — Telehealth (INDEPENDENT_AMBULATORY_CARE_PROVIDER_SITE_OTHER): Payer: BC Managed Care – PPO | Admitting: Family Medicine

## 2020-02-26 ENCOUNTER — Encounter (INDEPENDENT_AMBULATORY_CARE_PROVIDER_SITE_OTHER): Payer: Self-pay | Admitting: Family Medicine

## 2020-02-26 DIAGNOSIS — F3289 Other specified depressive episodes: Secondary | ICD-10-CM

## 2020-02-26 DIAGNOSIS — Z6841 Body Mass Index (BMI) 40.0 and over, adult: Secondary | ICD-10-CM

## 2020-02-27 ENCOUNTER — Encounter (INDEPENDENT_AMBULATORY_CARE_PROVIDER_SITE_OTHER): Payer: Self-pay | Admitting: Family Medicine

## 2020-02-27 ENCOUNTER — Encounter (INDEPENDENT_AMBULATORY_CARE_PROVIDER_SITE_OTHER): Payer: Self-pay

## 2020-02-27 ENCOUNTER — Other Ambulatory Visit (INDEPENDENT_AMBULATORY_CARE_PROVIDER_SITE_OTHER): Payer: Self-pay | Admitting: Family Medicine

## 2020-02-27 DIAGNOSIS — Z6841 Body Mass Index (BMI) 40.0 and over, adult: Secondary | ICD-10-CM

## 2020-02-27 NOTE — Telephone Encounter (Signed)
Last seen by Dawn Whitm[place], FNP 

## 2020-02-27 NOTE — Progress Notes (Signed)
TeleHealth Visit:  Due to the COVID-19 pandemic, this visit was completed with telemedicine (audio/video) technology to reduce patient and provider exposure as well as to preserve personal protective equipment.   [name] has verbally consented to this TeleHealth visit. The patient is located at home, the provider is located at the [facility] office. The participants in this visit include the listed provider and patient. The visit was conducted today via MyChart video.   Chief Complaint: OBESITY [name] is here to discuss her progress with her obesity treatment plan along with follow-up of her obesity related diagnoses. [name] is on keeping a food journal and adhering to recommended goals of 1800-1900 calories and 100+ grams of protein daily and states she is following her eating plan approximately 50% of the time. [name] states she is doing 0 minutes 0 times per week.  Today's visit was #: 21 Starting weight: 282 lbs Starting date: 11/01/2018  Interim History: [name] is on Saxenda 3.0 mg daily without side effects. She notes good appetite control and suppression of cravings. She is keeping track of her protein "in her head". She notes her calories are around 1800 per day.  Subjective:   1. Other depression, with emotional eating [name]'s cravings are well controlled with bupropion and Saxenda.  Assessment/Plan:   1. Other depression, with emotional eating . [name] will continue bupropion and Saxenda as is.   2. Class 3 severe obesity with serious comorbidity and body mass index (BMI) of 40.0 to 44.9 in adult, unspecified obesity type (HCC) [name] is currently in the action stage of change. As such, her goal is to continue with weight loss efforts. She has agreed to keeping a food journal and adhering to recommended goals of 1800-1900 calories and 100 grams of protein daily.   Exercise goals: No exercise has been prescribed at this time.  Behavioral  modification strategies: increasing lean protein intake and keeping a strict food journal.  [name] has agreed to follow-up with our clinic in 4 weeks.  Objective:   VITALS: Per patient if applicable, see vitals. GENERAL: Alert and in no acute distress. CARDIOPULMONARY: No increased WOB. Speaking in clear sentences.  PSYCH: Pleasant and cooperative. Speech normal rate and rhythm. Affect is appropriate. Insight and judgement are appropriate. Attention is focused, linear, and appropriate.  NEURO: Oriented as arrived to appointment on time with no prompting.   Lab Results  Component Value Date   CREATININE 0.92 09/17/2019   BUN 10 09/17/2019   NA 139 09/17/2019   K 3.9 09/17/2019   CL 105 09/17/2019   CO2 24 09/17/2019   Lab Results  Component Value Date   ALT 21 09/17/2019   AST 19 09/17/2019   ALKPHOS 51 09/17/2019   BILITOT 0.4 09/17/2019   Lab Results  Component Value Date   HGBA1C 5.6 10/09/2019   HGBA1C 5.3 02/02/2019   HGBA1C 5.5 11/01/2018   HGBA1C 5.5 07/06/2017   HGBA1C 5.7 05/05/2016   Lab Results  Component Value Date   INSULIN 25.3 (H) 02/02/2019   INSULIN 23.9 11/01/2018   Lab Results  Component Value Date   TSH 1.820 02/02/2019   Lab Results  Component Value Date   CHOL 115 10/09/2019   HDL 51.60 10/09/2019   LDLCALC 36 10/09/2019   TRIG 140.0 10/09/2019   CHOLHDL 2 10/09/2019   Lab Results  Component Value Date   WBC 9.2 10/09/2019   HGB 13.6 10/09/2019   HCT 40.1 10/09/2019   MCV 93.8 10/09/2019  PLT 370.0 10/09/2019   No results found for: IRON, TIBC, FERRITIN  Attestation Statements:   Reviewed by clinician on day of visit: allergies, medications, problem list, medical history, surgical history, family history, social history, and previous encounter notes.   Trude Mcburney, am acting as Energy manager for [place], FNP-C.  I have reviewed the above documentation for accuracy and completeness, and I agree with the above.  - Jesse Sans, FNP

## 2020-02-27 NOTE — Telephone Encounter (Signed)
Last OV with Dawn 

## 2020-02-28 ENCOUNTER — Ambulatory Visit (INDEPENDENT_AMBULATORY_CARE_PROVIDER_SITE_OTHER): Payer: BC Managed Care – PPO | Admitting: Physician Assistant

## 2020-02-28 ENCOUNTER — Other Ambulatory Visit: Payer: Self-pay

## 2020-02-28 ENCOUNTER — Encounter: Payer: Self-pay | Admitting: Physician Assistant

## 2020-02-28 VITALS — BP 120/84 | HR 84 | Temp 97.8°F | Resp 16 | Ht 65.0 in | Wt 259.0 lb

## 2020-02-28 DIAGNOSIS — D649 Anemia, unspecified: Secondary | ICD-10-CM | POA: Diagnosis not present

## 2020-02-28 DIAGNOSIS — Z020 Encounter for examination for admission to educational institution: Secondary | ICD-10-CM | POA: Diagnosis not present

## 2020-02-28 DIAGNOSIS — Z111 Encounter for screening for respiratory tuberculosis: Secondary | ICD-10-CM

## 2020-02-28 DIAGNOSIS — Z0184 Encounter for antibody response examination: Secondary | ICD-10-CM

## 2020-02-28 DIAGNOSIS — Z0000 Encounter for general adult medical examination without abnormal findings: Secondary | ICD-10-CM

## 2020-02-28 DIAGNOSIS — Z6841 Body Mass Index (BMI) 40.0 and over, adult: Secondary | ICD-10-CM

## 2020-02-28 DIAGNOSIS — F3289 Other specified depressive episodes: Secondary | ICD-10-CM

## 2020-02-28 LAB — POCT URINALYSIS DIPSTICK
Bilirubin, UA: NEGATIVE
Glucose, UA: NEGATIVE
Ketones, UA: NEGATIVE
Leukocytes, UA: NEGATIVE
Nitrite, UA: NEGATIVE
Protein, UA: NEGATIVE
Spec Grav, UA: >=1.03 — AB (ref 1.010–1.025)
Urobilinogen, UA: 0.2 E.U./dL
pH, UA: 5.5 (ref 5.0–8.0)

## 2020-02-28 NOTE — Progress Notes (Signed)
   Subjective:    Patient ID: [name], female    DOB: 14-Sep-1985, 35 y.o.   MRN: [id number]  HPI  35 yo patient here in office today for Transfer of Care as well as physical exam prior to NP school. She has worked as an Charity fundraiser at [employer] and is taking this year off to go to school. She has two children in school.  Her immunizations are UTD. Her last pap was done last year per patient with GYN. She is stable on her current medications for depression. She is working with [facility] at this time on nutrition and exercise.  Review of Systems  Constitutional: Negative for activity change, fatigue and fever.  Eyes: Negative for discharge.  Respiratory: Negative for cough and shortness of breath.   Cardiovascular: Negative for chest pain.  Gastrointestinal: Negative for abdominal pain.  Endocrine: Negative for polydipsia, polyphagia and polyuria.  Genitourinary: Negative for vaginal bleeding.  Musculoskeletal: Negative for arthralgias.  Neurological: Negative for dizziness.  Psychiatric/Behavioral: Negative for sleep disturbance and suicidal ideas.       Objective:   Physical Exam Vitals and nursing note reviewed.  Constitutional:      General: She is not in acute distress.    Appearance: Normal appearance. She is obese.  HENT:     Head: Normocephalic.     Right Ear: Tympanic membrane and ear canal normal.     Left Ear: Tympanic membrane and ear canal normal.     Nose: Nose normal.     Mouth/Throat:     Mouth: Mucous membranes are moist.  Eyes:     Extraocular Movements: Extraocular movements intact.     Pupils: Pupils are equal, round, and reactive to light.  Cardiovascular:     Rate and Rhythm: Normal rate and regular rhythm.     Pulses: Normal pulses.  Pulmonary:     Effort: Pulmonary effort is normal.     Breath sounds: Normal breath sounds.  Abdominal:     General: Abdomen is flat. Bowel sounds are normal.     Palpations: Abdomen is soft.   Musculoskeletal:        General: Normal range of motion.  Skin:    General: Skin is warm and dry.  Neurological:     Mental Status: She is alert.  Psychiatric:        Mood and Affect: Mood normal.        Behavior: Behavior normal.        Thought Content: Thought content normal.        Judgment: Judgment normal.    Vitals with BMI 02/28/2020 01/29/2020 01/08/2020  Height 5\' 5"  5\' 5"  5\' 5"   Weight 259 lbs 258 lbs 260 lbs  BMI 43.1 42.93 43.27  Systolic 120 115  Diastolic 84 74 83  Pulse 84 104 101  Some encounter information is confidential and restricted. Go to Review Flowsheets activity to see all data.       Assessment & Plan:   Encounter for health examination -  Form completed in office for patient today. Sent to lab for necessary labs per request of her school. No immunizations needed to be updated. She will continue f/up with GYN.  Obesity - She will continue working with [facility].   Depression - Stable on Effexor - XR 150 mg daily.

## 2020-02-28 NOTE — Patient Instructions (Signed)
Please stop by the lab today and then results will be sent to MyChart.  Call or schedule med check in 6 months, sooner if needing anything.  Pleasure to meet you today and good luck in NP school!

## 2020-02-29 LAB — HEMOGLOBIN: Hemoglobin: 13.9 g/dL (ref 12.0–15.0)

## 2020-03-01 LAB — MEASLES/MUMPS/RUBELLA IMMUNITY
Mumps IgG: 37.8 AU/mL
Rubella: 1.47 Index
Rubeola IgG: 86.2 AU/mL

## 2020-03-01 LAB — VARICELLA ZOSTER ANTIBODY, IGG: Varicella IgG: 593.6 index

## 2020-03-01 LAB — QUANTIFERON-TB GOLD PLUS
Mitogen-NIL: >10 IU/mL
NIL: 0.02 IU/mL
QuantiFERON-TB Gold Plus: NEGATIVE
TB1-NIL: 0 IU/mL
TB2-NIL: 0.01 IU/mL

## 2020-03-01 LAB — HEPATITIS B CORE ANTIBODY, IGM: Hep B C IgM: NONREACTIVE

## 2020-03-05 ENCOUNTER — Other Ambulatory Visit (INDEPENDENT_AMBULATORY_CARE_PROVIDER_SITE_OTHER): Payer: Self-pay | Admitting: Family Medicine

## 2020-03-05 DIAGNOSIS — E8881 Metabolic syndrome: Secondary | ICD-10-CM

## 2020-03-05 NOTE — Telephone Encounter (Signed)
Last seen Dawn 

## 2020-03-09 ENCOUNTER — Ambulatory Visit: Payer: Self-pay

## 2020-03-13 ENCOUNTER — Telehealth: Payer: Self-pay

## 2020-03-13 NOTE — Telephone Encounter (Signed)
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   Pt will need an appointment in person to discuss starting on Ambien. This is not something I typically prescribe though.

## 2020-03-13 NOTE — Telephone Encounter (Signed)
Pt called stating she saw your message attached to her lab results. Pt wanted to know if she needs to get the Hep B vaccine? Pt also asked if you could switch her sleep medication to [place] because it is less expensive with insurance. Please advise.

## 2020-03-15 NOTE — Telephone Encounter (Signed)
Pt scheduled for 03/19/2020

## 2020-03-19 ENCOUNTER — Telehealth (INDEPENDENT_AMBULATORY_CARE_PROVIDER_SITE_OTHER): Payer: BC Managed Care – PPO | Admitting: Physician Assistant

## 2020-03-19 DIAGNOSIS — G4700 Insomnia, unspecified: Secondary | ICD-10-CM

## 2020-03-19 MED ORDER — HYDROXYZINE HCL 25 MG PO TABS: 25.0000 mg | ORAL_TABLET | Freq: Every day | ORAL | 0 refills | Status: DC

## 2020-03-19 NOTE — Progress Notes (Signed)
Virtual Visit via Video Note  I connected with [name] on 03/19/20 at  9:30 AM EST by a video enabled telemedicine application and verified that I am speaking with the correct person using two identifiers.  Location: Patient: home Provider: Nature conservation officer at [employer]   I discussed the limitations of evaluation and management by telemedicine and the availability of in person appointments. The patient expressed understanding and agreed to proceed. Only the patient and myself were present for today's video call.   History of Present Illness:  Pt states she has always had sleep issues. She states that she has trouble falling asleep and then if she is woken up, she cannot go back to sleep. Currently taking Temazepam 22.5 mg for sleep, but it is costing about $93/month and she says it is not working that well. Currently takes Amitriptyline 25 mg at bedtime around 10 pm for migraines and IC. She also takes Melatonin 10 mg at bedtime. She had a sleep study done in 2019 and this was negative for OSA.  Has tried Trazodone previously, but this caused headaches. She wants to start back on Ambien as she says she took this years ago and it worked well.   Observations/Objective:   Gen: Awake, alert, no acute distress Resp: Breathing is even and non-labored Psych: calm/pleasant demeanor Neuro: Alert and Oriented x 3, + facial symmetry, speech is clear.   Assessment and Plan: 1. Insomnia, unspecified type Reviewed sleep study from 2019 - negative for OSA. Patient has struggled with insomnia for years. Discussed several options with her today including possibly increasing the dose of Amitriptyline she is already taking, trying Mirtazapine, or Hydroxyzine. I do not recommend Ambien and will not start this medication due to its abuse and addiction potential. Agreed to start Hydroxyzine 25 mg and then may gradually increase as needed. Also important for GOOD SLEEP HYGIENE. Follow  up with me in one month.   Follow Up Instructions:    I discussed the assessment and treatment plan with the patient. The patient was provided an opportunity to ask questions and all were answered. The patient agreed with the plan and demonstrated an understanding of the instructions.   The patient was advised to call back or seek an in-person evaluation if the symptoms worsen or if the condition fails to improve as anticipated.  [name] M Samantha Tyree, PA-C

## 2020-03-19 NOTE — Patient Instructions (Signed)
STOP the Temazepam at bedtime. START Hydroxyzine as directed. F/up in one month with me.    Insomnia Insomnia is a sleep disorder that makes it difficult to fall asleep or stay asleep. Insomnia can cause fatigue, low energy, difficulty concentrating, mood swings, and poor performance at work or school. There are three different ways to classify insomnia:  Difficulty falling asleep.  Difficulty staying asleep.  Waking up too early in the morning. Any type of insomnia can be long-term (chronic) or short-term (acute). Both are common. Short-term insomnia usually lasts for three months or less. Chronic insomnia occurs at least three times a week for longer than three months. What are the causes? Insomnia may be caused by another condition, situation, or substance, such as:  Anxiety.  Certain medicines.  Gastroesophageal reflux disease (GERD) or other gastrointestinal conditions.  Asthma or other breathing conditions.  Restless legs syndrome, sleep apnea, or other sleep disorders.  Chronic pain.  Menopause.  Stroke.  Abuse of alcohol, tobacco, or illegal drugs.  Mental health conditions, such as depression.  Caffeine.  Neurological disorders, such as Alzheimer's disease.  An overactive thyroid (hyperthyroidism). Sometimes, the cause of insomnia may not be known. What increases the risk? Risk factors for insomnia include:  Gender. Women are affected more often than men.  Age. Insomnia is more common as you get older.  Stress.  Lack of exercise.  Irregular work schedule or working night shifts.  Traveling between different time zones.  Certain medical and mental health conditions. What are the signs or symptoms? If you have insomnia, the main symptom is having trouble falling asleep or having trouble staying asleep. This may lead to other symptoms, such as:  Feeling fatigued or having low energy.  Feeling nervous about going to sleep.  Not feeling rested in  the morning.  Having trouble concentrating.  Feeling irritable, anxious, or depressed. How is this diagnosed? This condition may be diagnosed based on:  Your symptoms and medical history. Your health care provider may ask about: ? Your sleep habits. ? Any medical conditions you have. ? Your mental health.  A physical exam. How is this treated? Treatment for insomnia depends on the cause. Treatment may focus on treating an underlying condition that is causing insomnia. Treatment may also include:  Medicines to help you sleep.  Counseling or therapy.  Lifestyle adjustments to help you sleep better. Follow these instructions at home: Eating and drinking  Limit or avoid alcohol, caffeinated beverages, and cigarettes, especially close to bedtime. These can disrupt your sleep.  Do not eat a large meal or eat spicy foods right before bedtime. This can lead to digestive discomfort that can make it hard for you to sleep.   Sleep habits  Keep a sleep diary to help you and your health care provider figure out what could be causing your insomnia. Write down: ? When you sleep. ? When you wake up during the night. ? How well you sleep. ? How rested you feel the next day. ? Any side effects of medicines you are taking. ? What you eat and drink.  Make your bedroom a dark, comfortable place where it is easy to fall asleep. ? Put up shades or blackout curtains to block light from outside. ? Use a white noise machine to block noise. ? Keep the temperature cool.  Limit screen use before bedtime. This includes: ? Watching TV. ? Using your smartphone, tablet, or computer.  Stick to a routine that includes going to bed and  waking up at the same times every day and night. This can help you fall asleep faster. Consider making a quiet activity, such as reading, part of your nighttime routine.  Try to avoid taking naps during the day so that you sleep better at night.  Get out of bed if you  are still awake after 15 minutes of trying to sleep. Keep the lights down, but try reading or doing a quiet activity. When you feel sleepy, go back to bed.   General instructions  Take over-the-counter and prescription medicines only as told by your health care provider.  Exercise regularly, as told by your health care provider. Avoid exercise starting several hours before bedtime.  Use relaxation techniques to manage stress. Ask your health care provider to suggest some techniques that may work well for you. These may include: ? Breathing exercises. ? Routines to release muscle tension. ? Visualizing peaceful scenes.  Make sure that you drive carefully. Avoid driving if you feel very sleepy.  Keep all follow-up visits as told by your health care provider. This is important. Contact a health care provider if:  You are tired throughout the day.  You have trouble in your daily routine due to sleepiness.  You continue to have sleep problems, or your sleep problems get worse. Get help right away if:  You have serious thoughts about hurting yourself or someone else. If you ever feel like you may hurt yourself or others, or have thoughts about taking your own life, get help right away. You can go to your nearest emergency department or call:  Your local emergency services (911 in the [place]  A suicide crisis helpline, such as the National Suicide Prevention Lifeline at (438)703-0764. This is open 24 hours a day. Summary  Insomnia is a sleep disorder that makes it difficult to fall asleep or stay asleep.  Insomnia can be long-term (chronic) or short-term (acute).  Treatment for insomnia depends on the cause. Treatment may focus on treating an underlying condition that is causing insomnia.  Keep a sleep diary to help you and your health care provider figure out what could be causing your insomnia. This information is not intended to replace advice given to you by your health care  provider. Make sure you discuss any questions you have with your health care provider. Document Revised: 11/23/2019 Document Reviewed: 11/23/2019 Elsevier Patient Education  2021 [employer].

## 2020-03-22 ENCOUNTER — Other Ambulatory Visit (INDEPENDENT_AMBULATORY_CARE_PROVIDER_SITE_OTHER): Payer: Self-pay | Admitting: Family Medicine

## 2020-03-22 DIAGNOSIS — F3289 Other specified depressive episodes: Secondary | ICD-10-CM

## 2020-03-25 ENCOUNTER — Telehealth (INDEPENDENT_AMBULATORY_CARE_PROVIDER_SITE_OTHER): Payer: BC Managed Care – PPO | Admitting: Family Medicine

## 2020-03-25 ENCOUNTER — Encounter (INDEPENDENT_AMBULATORY_CARE_PROVIDER_SITE_OTHER): Payer: Self-pay | Admitting: Family Medicine

## 2020-03-25 ENCOUNTER — Other Ambulatory Visit: Payer: Self-pay

## 2020-03-25 DIAGNOSIS — Z6841 Body Mass Index (BMI) 40.0 and over, adult: Secondary | ICD-10-CM

## 2020-03-25 DIAGNOSIS — F3289 Other specified depressive episodes: Secondary | ICD-10-CM | POA: Diagnosis not present

## 2020-03-26 ENCOUNTER — Encounter (INDEPENDENT_AMBULATORY_CARE_PROVIDER_SITE_OTHER): Payer: Self-pay | Admitting: Family Medicine

## 2020-03-26 NOTE — Progress Notes (Signed)
TeleHealth Visit:  Due to the COVID-19 pandemic, this visit was completed with telemedicine (audio/video) technology to reduce patient and provider exposure as well as to preserve personal protective equipment.   [name] has verbally consented to this TeleHealth visit. The patient is located at home, the provider is located at the [facility] office. The participants in this visit include the listed provider and patient. The visit was conducted today via MyChart video.   Chief Complaint: OBESITY [name] is here to discuss her progress with her obesity treatment plan along with follow-up of her obesity related diagnoses. [name] is on keeping a food journal and adhering to recommended goals of 1800-1900 calories and 100 grams of protein daily and states she is following her eating plan approximately 25% of the time. [name] states she is doing 0 minutes 0 times per week.  Today's visit was #: 22 Starting weight: 282 lbs Starting date: 11/01/2018  Interim History: [name] is ill at home with COVID. She has had it for 2 weeks. She has a cough and fatigue. She has not been journaling and has been focusing on pushing fluids. She has been eating some protein bars too. She is on Saxenda 3.0 mg daily. She has not weighed herself at home.  Subjective:   1. Other depression, with emotional eating [name]'s appetite and cravings are well controlled. She is on bupropion and Effexor.  Assessment/Plan:   1. Other depression, with emotional eating  [name] will continue Effexor and bupropion.   2. Class 3 severe obesity with serious comorbidity and body mass index (BMI) of 40.0 to 44.9 in adult, unspecified obesity type (HCC) [name] is currently in the action stage of change. As such, her goal is to continue with weight loss efforts. She has agreed to keeping a food journal and adhering to recommended goals of 1800-1900 calories and 100 grams of protein daily.   We  discussed various medication options to help [name] with her weight loss efforts and we both agreed to continue Saxenda at 3.0 mg daily.  Exercise goals: No exercise has been prescribed at this time.  Behavioral modification strategies: increasing lean protein intake and decreasing simple carbohydrates.  [name] has agreed to follow-up with our clinic in 4 weeks.   Objective:   VITALS: Per patient if applicable, see vitals. GENERAL: Alert and in no acute distress. CARDIOPULMONARY: No increased WOB. Speaking in clear sentences.  PSYCH: Pleasant and cooperative. Speech normal rate and rhythm. Affect is appropriate. Insight and judgement are appropriate. Attention is focused, linear, and appropriate.  NEURO: Oriented as arrived to appointment on time with no prompting.   Lab Results  Component Value Date   CREATININE 0.92 09/17/2019   BUN 10 09/17/2019   NA 139 09/17/2019   K 3.9 09/17/2019   CL 105 09/17/2019   CO2 24 09/17/2019   Lab Results  Component Value Date   ALT 21 09/17/2019   AST 19 09/17/2019   ALKPHOS 51 09/17/2019   BILITOT 0.4 09/17/2019   Lab Results  Component Value Date   HGBA1C 5.6 10/09/2019   HGBA1C 5.3 02/02/2019   HGBA1C 5.5 11/01/2018   HGBA1C 5.5 07/06/2017   HGBA1C 5.7 05/05/2016   Lab Results  Component Value Date   INSULIN 25.3 (H) 02/02/2019   INSULIN 23.9 11/01/2018   Lab Results  Component Value Date   TSH 1.820 02/02/2019   Lab Results  Component Value Date   CHOL 115 10/09/2019   HDL 51.60 10/09/2019  LDLCALC 36 10/09/2019   TRIG 140.0 10/09/2019   CHOLHDL 2 10/09/2019   Lab Results  Component Value Date   WBC 9.2 10/09/2019   HGB 13.9 02/28/2020   HCT 40.1 10/09/2019   MCV 93.8 10/09/2019   PLT 370.0 10/09/2019   No results found for: IRON, TIBC, FERRITIN  Attestation Statements:   Reviewed by clinician on day of visit: allergies, medications, problem list, medical history, surgical history, family history, social  history, and previous encounter notes.   [name], am acting as Energy manager for [place], FNP-C.  I have reviewed the above documentation for accuracy and completeness, and I agree with the above. - Samantha Sans, FNP

## 2020-04-09 ENCOUNTER — Encounter: Payer: Self-pay | Admitting: Physician Assistant

## 2020-04-10 ENCOUNTER — Other Ambulatory Visit: Payer: Self-pay | Admitting: Physician Assistant

## 2020-04-10 DIAGNOSIS — G4700 Insomnia, unspecified: Secondary | ICD-10-CM

## 2020-04-10 MED ORDER — AMITRIPTYLINE HCL 25 MG PO TABS
ORAL_TABLET | ORAL | 1 refills | Status: DC
Start: 2020-04-10 — End: 2020-10-14

## 2020-04-13 ENCOUNTER — Other Ambulatory Visit: Payer: Self-pay | Admitting: Physician Assistant

## 2020-04-21 ENCOUNTER — Other Ambulatory Visit (INDEPENDENT_AMBULATORY_CARE_PROVIDER_SITE_OTHER): Payer: Self-pay | Admitting: Family Medicine

## 2020-04-21 DIAGNOSIS — F3289 Other specified depressive episodes: Secondary | ICD-10-CM

## 2020-04-22 ENCOUNTER — Encounter (INDEPENDENT_AMBULATORY_CARE_PROVIDER_SITE_OTHER): Payer: Self-pay | Admitting: Family Medicine

## 2020-04-22 ENCOUNTER — Telehealth (INDEPENDENT_AMBULATORY_CARE_PROVIDER_SITE_OTHER): Payer: Self-pay

## 2020-04-22 ENCOUNTER — Other Ambulatory Visit: Payer: Self-pay

## 2020-04-22 ENCOUNTER — Ambulatory Visit (INDEPENDENT_AMBULATORY_CARE_PROVIDER_SITE_OTHER): Payer: BC Managed Care – PPO | Admitting: Family Medicine

## 2020-04-22 VITALS — BP 127/78 | HR 103 | Temp 98.7°F | Ht 65.0 in | Wt 260.0 lb

## 2020-04-22 DIAGNOSIS — Z6841 Body Mass Index (BMI) 40.0 and over, adult: Secondary | ICD-10-CM | POA: Diagnosis not present

## 2020-04-22 DIAGNOSIS — F3289 Other specified depressive episodes: Secondary | ICD-10-CM

## 2020-04-22 DIAGNOSIS — R7303 Prediabetes: Secondary | ICD-10-CM | POA: Diagnosis not present

## 2020-04-22 DIAGNOSIS — Z9189 Other specified personal risk factors, not elsewhere classified: Secondary | ICD-10-CM | POA: Diagnosis not present

## 2020-04-22 MED ORDER — METFORMIN HCL 500 MG PO TABS: 500.0000 mg | ORAL_TABLET | Freq: Two times a day (BID) | ORAL | 0 refills | Status: DC

## 2020-04-22 MED ORDER — BUPROPION HCL ER (SR) 200 MG PO TB12: 200.0000 mg | ORAL_TABLET | Freq: Two times a day (BID) | ORAL | 0 refills | Status: DC

## 2020-04-22 MED ORDER — SAXENDA 18 MG/3ML ~~LOC~~ SOPN
3.0000 mg | PEN_INJECTOR | Freq: Every day | SUBCUTANEOUS | 0 refills | Status: DC
Start: 2020-04-22 — End: 2020-05-20

## 2020-04-22 NOTE — Telephone Encounter (Signed)
Last OV with Dawn 

## 2020-04-22 NOTE — Telephone Encounter (Signed)
PA initiated via [email or web address] for Saxenda 18mg /70mL.  Key: 1m Saxenda 18MG [place] Pall pen-injectors   Form: [facility] Electronic Prior Authorization Form (2017 NCP[employer])  Determination: Wait for Determination Please wait for [facility] 2017 NCPDP to return a determination.

## 2020-04-23 NOTE — Telephone Encounter (Signed)
Rx request 

## 2020-04-24 ENCOUNTER — Encounter (INDEPENDENT_AMBULATORY_CARE_PROVIDER_SITE_OTHER): Payer: Self-pay | Admitting: Family Medicine

## 2020-04-24 NOTE — Progress Notes (Signed)
Chief Complaint:   OBESITY [name] is here to discuss her progress with her obesity treatment plan along with follow-up of her obesity related diagnoses. [name] is on keeping a food journal and adhering to recommended goals of 1800-1900 calories and 100 grams of protein daily and states she is following her eating plan approximately 75% of the time. [name] states she is biking for 20-30 minutes 2 times per week.  Today's visit was #: 23 Starting weight: 282 lbs Starting date: 11/01/2018 Today's weight: 260 lbs Today's date: 04/22/2020 Total lbs lost to date: 22 Total lbs lost since last in-office visit: 0  Interim History: [name] is on 3.0 mg of Saxenda daily and her appetite is well controlled. She notes she tends to eat less than she should and she is not journaling consistently. She eats protein bars- sometimes 2 times per day. The protein bars have 13 grams of protein a piece.  Subjective:   1. Pre-diabetes [name]'s last A1c was 5.6. She is on Saxenda and metformin, and she denies polyphagia.   Lab Results  Component Value Date   HGBA1C 5.6 10/09/2019   Lab Results  Component Value Date   INSULIN 25.3 (H) 02/02/2019   INSULIN 23.9 11/01/2018   2. Other depression, with emotional eating [name]'s mood is stable. She has occasional sweet cravings. She is taking bupropion 200 mg BID.  3. At risk for deficient intake of food The patient is at a higher than average risk of deficient intake of food due to decreased in food intake (protein)  Assessment/Plan:   1. Pre-diabetes  We will refill metformin for 1 month.  - metFORMIN (GLUCOPHAGE) 500 MG tablet; Take 1 tablet (500 mg total) by mouth 2 (two) times daily with a meal.  Dispense: 60 tablet; Refill: 0  2. Other depression, with emotional eating  We will refill bupropion for 1 month.   - buPROPion (WELLBUTRIN SR) 200 MG 12 hr tablet; Take 1 tablet (200 mg total) by mouth 2 (two) times daily.  Dispense: 60  tablet; Refill: 0  3. At risk for deficient intake of food [name] was given approximately 15 minutes of deficit intake of food prevention counseling today. [name] is at risk for eating too few calories based on current food recall. She was encouraged to focus on meeting caloric and protein goals according to her recommended meal plan.   4. Obesity: BMI 43 [name] is currently in the action stage of change. As such, her goal is to continue with weight loss efforts. She has agreed to keeping a food journal and adhering to recommended goals of 1200-1800 calories and 100 grams of protein daily.  Continue taking Saxenda 3.0 mg daily (she is to take 1.8 mg daily due to cost).  - Liraglutide -Weight Management (SAXENDA) 18 MG/3ML SOPN; Inject 3 mg into the skin daily.  Dispense: 15 mL; Refill: 0  Exercise goals: As is.  Behavioral modification strategies: increasing lean protein intake and decreasing simple carbohydrates.  [name] has agreed to follow-up with our clinic in 4 weeks.  Objective:   Blood pressure 127/78, pulse (!) 103, temperature 98.7 F (37.1 C), height 5\' 5"  (1.651 m), weight 260 lb (117.9 kg), SpO2 97 %. Body mass index is 43.27 kg/m.  General: Cooperative, alert, well developed, in no acute distress. HEENT: Conjunctivae and lids unremarkable. Cardiovascular: Regular rhythm.  Lungs: Normal work of breathing. Neurologic: No focal deficits.   Lab Results  Component Value Date   CREATININE 0.92 09/17/2019  BUN 10 09/17/2019   NA 139 09/17/2019   K 3.9 09/17/2019   CL 105 09/17/2019   CO2 24 09/17/2019   Lab Results  Component Value Date   ALT 21 09/17/2019   AST 19 09/17/2019   ALKPHOS 51 09/17/2019   BILITOT 0.4 09/17/2019   Lab Results  Component Value Date   HGBA1C 5.6 10/09/2019   HGBA1C 5.3 02/02/2019   HGBA1C 5.5 11/01/2018   HGBA1C 5.5 07/06/2017   HGBA1C 5.7 05/05/2016   Lab Results  Component Value Date   INSULIN 25.3 (H) 02/02/2019    INSULIN 23.9 11/01/2018   Lab Results  Component Value Date   TSH 1.820 02/02/2019   Lab Results  Component Value Date   CHOL 115 10/09/2019   HDL 51.60 10/09/2019   LDLCALC 36 10/09/2019   TRIG 140.0 10/09/2019   CHOLHDL 2 10/09/2019   Lab Results  Component Value Date   WBC 9.2 10/09/2019   HGB 13.9 02/28/2020   HCT 40.1 10/09/2019   MCV 93.8 10/09/2019   PLT 370.0 10/09/2019   No results found for: IRON, TIBC, FERRITIN  Attestation Statements:   Reviewed by clinician on day of visit: allergies, medications, problem list, medical history, surgical history, family history, social history, and previous encounter notes.   [name], am acting as Energy manager for [place], FNP-C.  I have reviewed the above documentation for accuracy and completeness, and I agree with the above. -  Jesse Sans, FNP

## 2020-04-27 ENCOUNTER — Other Ambulatory Visit (INDEPENDENT_AMBULATORY_CARE_PROVIDER_SITE_OTHER): Payer: Self-pay | Admitting: Family Medicine

## 2020-04-27 DIAGNOSIS — R7303 Prediabetes: Secondary | ICD-10-CM

## 2020-05-06 ENCOUNTER — Encounter (INDEPENDENT_AMBULATORY_CARE_PROVIDER_SITE_OTHER): Payer: Self-pay | Admitting: Family Medicine

## 2020-05-06 NOTE — Telephone Encounter (Signed)
FYI

## 2020-05-08 ENCOUNTER — Telehealth (INDEPENDENT_AMBULATORY_CARE_PROVIDER_SITE_OTHER): Payer: Self-pay | Admitting: Emergency Medicine

## 2020-05-08 NOTE — Telephone Encounter (Signed)
Prior Auth- Denied :Saxenda inj 18mg /58ml  Case 1m- [employer]

## 2020-05-20 ENCOUNTER — Other Ambulatory Visit: Payer: Self-pay

## 2020-05-20 ENCOUNTER — Ambulatory Visit (INDEPENDENT_AMBULATORY_CARE_PROVIDER_SITE_OTHER): Payer: BC Managed Care – PPO | Admitting: Family Medicine

## 2020-05-20 ENCOUNTER — Encounter (INDEPENDENT_AMBULATORY_CARE_PROVIDER_SITE_OTHER): Payer: Self-pay | Admitting: Family Medicine

## 2020-05-20 VITALS — BP 119/83 | HR 113 | Temp 98.8°F | Ht 65.0 in | Wt 262.0 lb

## 2020-05-20 DIAGNOSIS — F3289 Other specified depressive episodes: Secondary | ICD-10-CM

## 2020-05-20 DIAGNOSIS — Z6841 Body Mass Index (BMI) 40.0 and over, adult: Secondary | ICD-10-CM

## 2020-05-20 DIAGNOSIS — R7303 Prediabetes: Secondary | ICD-10-CM | POA: Diagnosis not present

## 2020-05-20 DIAGNOSIS — R7301 Impaired fasting glucose: Secondary | ICD-10-CM

## 2020-05-20 MED ORDER — BUPROPION HCL ER (SR) 200 MG PO TB12: 200.0000 mg | ORAL_TABLET | Freq: Two times a day (BID) | ORAL | 0 refills | Status: DC

## 2020-05-20 MED ORDER — TRULICITY 0.75 MG/0.5ML ~~LOC~~ SOAJ
0.7500 mg | SUBCUTANEOUS | 0 refills | Status: DC
Start: 2020-05-20 — End: 2020-06-11

## 2020-05-20 MED ORDER — METFORMIN HCL 500 MG PO TABS: 500.0000 mg | ORAL_TABLET | Freq: Two times a day (BID) | ORAL | 0 refills | Status: DC

## 2020-05-21 ENCOUNTER — Encounter (INDEPENDENT_AMBULATORY_CARE_PROVIDER_SITE_OTHER): Payer: Self-pay | Admitting: Family Medicine

## 2020-05-21 NOTE — Progress Notes (Signed)
Chief Complaint:   OBESITY [name] is here to discuss her progress with her obesity treatment plan along with follow-up of her obesity related diagnoses. [name] is on keeping a food journal and adhering to recommended goals of 1200-1800 calories and 100 grams of protein daily and states she is following her eating plan approximately 60% of the time. [name] states she is doing 0 minutes 0 times per week.  Today's visit was #: 24 Starting weight: 282 lbs Starting date: 11/01/2018 Today's weight: 262 lbs Today's date: 05/20/2020 Total lbs lost to date: 20 Total lbs lost since last in-office visit: +2  Interim History: [name] is journaling via [employer] app. She is allowed 29 points per day, and she notes she usually hits this goal. She notes her family tends to sabotage her with ice cream and carbohydrates. She is on 1.8 mg of Saxenda. Her insurance no longer covers it so she reduced the dose to make it last longer.  Subjective:   1. Pre-diabetes [name] is on Saxenda and metformin.  Lab Results  Component Value Date   HGBA1C 5.6 10/09/2019   Lab Results  Component Value Date   INSULIN 25.3 (H) 02/02/2019   INSULIN 23.9 11/01/2018   2. Other depression, with emotional eating [name]'s mood is stable on bupropion and Effexor.  Assessment/Plan:   1. Pre-diabetes I will send Trulicity and see if her insurance will cover this.  Refill metformin for 90 days for 1 month.  New Rx:  Trulicity at 0.25 mg weekly with no refills.  - metFORMIN (GLUCOPHAGE) 500 MG tablet; Take 1 tablet (500 mg total) by mouth 2 (two) times daily with a meal.  Dispense: 180 tablet; Refill: 0 - Dulaglutide (TRULICITY) 0.75 MG/0.5ML SOPN; Inject 0.75 mg into the skin once a week.  Dispense: 0.5 mL; Refill: 0  2. Other depression, with emotional eating Refill bupropion for 90 days with no refills. Orders and follow up as documented in patient record.   - buPROPion (WELLBUTRIN SR) 200 MG  12 hr tablet; Take 1 tablet (200 mg total) by mouth 2 (two) times daily.  Dispense: 180 tablet; Refill: 0  3. Obesity: BMI 43 [name] is currently in the action stage of change. As such, her goal is to continue with weight loss efforts. She has agreed to keeping a food journal and adhering to recommended goals of 1200-1800 calories and 100 grams of protein daily.   I recommended that [name] journal with [employer] versus Weight Watchers.  Exercise goals: All adults should avoid inactivity. Some physical activity is better than none, and adults who participate in any amount of physical activity gain some health benefits.  Behavioral modification strategies: increasing lean protein intake and decreasing simple carbohydrates.  [name] has agreed to follow-up with our clinic in 3 weeks.   Objective:   Blood pressure 119/83, pulse (!) 113, temperature 98.8 F (37.1 C), height 5\' 5"  (1.651 m), weight 262 lb (118.8 kg), SpO2 96 %. Body mass index is 43.6 kg/m.  General: Cooperative, alert, well developed, in no acute distress. HEENT: Conjunctivae and lids unremarkable. Cardiovascular: Regular rhythm.  Lungs: Normal work of breathing. Neurologic: No focal deficits.   Lab Results  Component Value Date   CREATININE 0.92 09/17/2019   BUN 10 09/17/2019   NA 139 09/17/2019   K 3.9 09/17/2019   CL 105 09/17/2019   CO2 24 09/17/2019   Lab Results  Component Value Date   ALT 21 09/17/2019   AST 19  09/17/2019   ALKPHOS 51 09/17/2019   BILITOT 0.4 09/17/2019   Lab Results  Component Value Date   HGBA1C 5.6 10/09/2019   HGBA1C 5.3 02/02/2019   HGBA1C 5.5 11/01/2018   HGBA1C 5.5 07/06/2017   HGBA1C 5.7 05/05/2016   Lab Results  Component Value Date   INSULIN 25.3 (H) 02/02/2019   INSULIN 23.9 11/01/2018   Lab Results  Component Value Date   TSH 1.820 02/02/2019   Lab Results  Component Value Date   CHOL 115 10/09/2019   HDL 51.60 10/09/2019   LDLCALC 36 10/09/2019    TRIG 140.0 10/09/2019   CHOLHDL 2 10/09/2019   Lab Results  Component Value Date   WBC 9.2 10/09/2019   HGB 13.9 02/28/2020   HCT 40.1 10/09/2019   MCV 93.8 10/09/2019   PLT 370.0 10/09/2019   No results found for: IRON, TIBC, FERRITIN  Attestation Statements:   Reviewed by clinician on day of visit: allergies, medications, problem list, medical history, surgical history, family history, social history, and previous encounter notes.   Trude Mcburney, am acting as Energy manager for [place], FNP-C.  I have reviewed the above documentation for accuracy and completeness, and I agree with the above. -  Jesse Sans, FNP

## 2020-05-22 ENCOUNTER — Telehealth (INDEPENDENT_AMBULATORY_CARE_PROVIDER_SITE_OTHER): Payer: Self-pay

## 2020-05-22 NOTE — Telephone Encounter (Signed)
Late entry for 05/20/20 - Pt contacted at the request of Dawn Whitmire re Trulicity prescription. Pt advised that her [employer] plan had previously denied coverage due to her not having a diagnosis of Type 2 diabetes. Pt was advised that since she has changed insurance but still with [employer] to contact her insurance to see if her new plan will cover the medication for a diagnosis of metabolic syndrome ICD-10 code V81.84. Explained that they may not but still a chance as [employer] has many different policies. Pt verbalized understanding and stated she would reach out to her carrier.  Channon Brougher LPN

## 2020-06-11 ENCOUNTER — Encounter (INDEPENDENT_AMBULATORY_CARE_PROVIDER_SITE_OTHER): Payer: Self-pay | Admitting: Family Medicine

## 2020-06-11 ENCOUNTER — Ambulatory Visit (INDEPENDENT_AMBULATORY_CARE_PROVIDER_SITE_OTHER): Payer: BC Managed Care – PPO | Admitting: Family Medicine

## 2020-06-11 ENCOUNTER — Other Ambulatory Visit: Payer: Self-pay

## 2020-06-11 VITALS — BP 133/83 | HR 113 | Temp 99.5°F | Ht 65.0 in | Wt 261.0 lb

## 2020-06-11 DIAGNOSIS — R7303 Prediabetes: Secondary | ICD-10-CM

## 2020-06-11 DIAGNOSIS — Z6841 Body Mass Index (BMI) 40.0 and over, adult: Secondary | ICD-10-CM

## 2020-06-11 DIAGNOSIS — F3289 Other specified depressive episodes: Secondary | ICD-10-CM

## 2020-06-11 DIAGNOSIS — E559 Vitamin D deficiency, unspecified: Secondary | ICD-10-CM

## 2020-06-11 DIAGNOSIS — E66813 Obesity, class 3: Secondary | ICD-10-CM

## 2020-06-11 MED ORDER — METFORMIN HCL 500 MG PO TABS: 500.0000 mg | ORAL_TABLET | Freq: Two times a day (BID) | ORAL | 0 refills | Status: DC

## 2020-06-11 MED ORDER — TRULICITY 1.5 MG/0.5ML ~~LOC~~ SOAJ: 1.5000 mg | SUBCUTANEOUS | 0 refills | Status: DC

## 2020-06-11 MED ORDER — BUPROPION HCL ER (SR) 200 MG PO TB12: 200.0000 mg | ORAL_TABLET | Freq: Two times a day (BID) | ORAL | 0 refills | Status: DC

## 2020-06-11 MED ORDER — VITAMIN D 125 MCG (5000 UT) PO CAPS
1.0000 | ORAL_CAPSULE | Freq: Every day | ORAL | 0 refills | Status: AC
Start: 2020-06-11 — End: ?

## 2020-06-12 ENCOUNTER — Encounter (INDEPENDENT_AMBULATORY_CARE_PROVIDER_SITE_OTHER): Payer: Self-pay | Admitting: Family Medicine

## 2020-06-12 DIAGNOSIS — E559 Vitamin D deficiency, unspecified: Secondary | ICD-10-CM | POA: Insufficient documentation

## 2020-06-12 DIAGNOSIS — R7303 Prediabetes: Secondary | ICD-10-CM | POA: Insufficient documentation

## 2020-06-12 LAB — COMPREHENSIVE METABOLIC PANEL
ALT: 15 IU/L (ref 0–32)
AST: 9 IU/L (ref 0–40)
Albumin/Globulin Ratio: 1.3 (ref 1.2–2.2)
Albumin: 4 g/dL (ref 3.8–4.8)
Alkaline Phosphatase: 66 IU/L (ref 44–121)
BUN/Creatinine Ratio: 14 (ref 9–23)
BUN: 13 mg/dL (ref 6–20)
Bilirubin Total: 0.2 mg/dL (ref 0.0–1.2)
CO2: 23 mmol/L (ref 20–29)
Calcium: 9.4 mg/dL (ref 8.7–10.2)
Chloride: 101 mmol/L (ref 96–106)
Creatinine, Ser: 0.96 mg/dL (ref 0.57–1.00)
Globulin, Total: 3 g/dL (ref 1.5–4.5)
Glucose: 81 mg/dL (ref 65–99)
Potassium: 4.6 mmol/L (ref 3.5–5.2)
Sodium: 140 mmol/L (ref 134–144)
Total Protein: 7 g/dL (ref 6.0–8.5)
eGFR: 78 mL/min/{1.73_m2} (ref >59)

## 2020-06-12 LAB — HEMOGLOBIN A1C
Est. average glucose Bld gHb Est-mCnc: 108 mg/dL
Hgb A1c MFr Bld: 5.4 % (ref 4.8–5.6)

## 2020-06-12 LAB — INSULIN, RANDOM: INSULIN: 17.5 u[IU]/mL (ref 2.6–24.9)

## 2020-06-12 NOTE — Progress Notes (Signed)
Chief Complaint:   OBESITY [name] is here to discuss her progress with her obesity treatment plan along with follow-up of her obesity related diagnoses. [name] is on keeping a food journal and adhering to recommended goals of 1200-1800 calories and 100 g protein and states she is following her eating plan approximately 60% of the time. [name] states she is not currently exercising.  Today's visit was #: 25 Starting weight: 282 lbs Starting date: 11/01/2018 Today's weight: 261 lbs Today's date: 06/11/2020 Total lbs lost to date: 21 Total lbs lost since last in-office visit: 1  Interim History: [name] recently returned from [place]. She says she walked 56,000 steps over 4 days. She is not journaling. She is planning on starting NOOM, which was free with start of Trulicity.  Subjective:   1. Other depression, with emotional eating [name]'s mood is stable. Her cravings are controlled. She is on Effexor and bupropion.   2. Pre-diabetes [name] has been on Trulicity 0.25 mg weekly. She has not noticed any appetite suppression.   Lab Results  Component Value Date   HGBA1C 5.4 06/11/2020   Lab Results  Component Value Date   INSULIN 17.5 06/11/2020   INSULIN 25.3 (H) 02/02/2019   INSULIN 23.9 11/01/2018   3. Vitamin D deficiency [name] takes OTC Vit D 5K IU QD. Her last Vit D level was at goal.  Assessment/Plan:   1. Other depression, with emotional eating   -Continue bupropion and Effexor. - buPROPion (WELLBUTRIN SR) 200 MG 12 hr tablet; Take 1 tablet (200 mg total) by mouth 2 (two) times daily.  Dispense: 180 tablet; Refill: 0  2. Pre-diabetes Check labs:  - Comprehensive metabolic panel - Hemoglobin A1c - Insulin, random - Dulaglutide (TRULICITY) 1.5 MG/0.5ML SOPN; Inject 1.5 mg into the skin once a week.  Dispense: 0.5 mL; Refill: 0 - metFORMIN (GLUCOPHAGE) 500 MG tablet; Take 1 tablet (500 mg total) by mouth 2 (two) times daily with a meal.  Dispense: 180  tablet; Refill: 0  3. Vitamin D deficiency She agrees to continue to take OTC Vitamin D @5 ,000 IU QD and will follow-up for routine testing of Vitamin D, at least 2-3 times per year to avoid over-replacement.  - Cholecalciferol (VITAMIN D) 125 MCG (5000 UT) CAPS; Take 1 capsule by mouth daily.  Dispense: 30 capsule; Refill: 0 - VITAMIN D 25 Hydroxy (Vit-D Deficiency, Fractures)  4. Obesity: BMI 43.43  [name] is currently in the action stage of change. As such, her goal is to continue with weight loss efforts. She has agreed to keeping a food journal and adhering to recommended goals of 1200-1800 calories and 100 g protein.   Exercise goals: No exercise has been prescribed at this time.  Behavioral modification strategies: increasing lean protein intake, decreasing simple carbohydrates and keeping a strict food journal.  [name] has agreed to follow-up with our clinic in 5 weeks. [name] was informed we would discuss her lab results at her next visit unless there is a critical issue that needs to be addressed sooner. [name] agreed to keep her next visit at the agreed upon time to discuss these results.  Objective:   Blood pressure 133/83, pulse (!) 113, temperature 99.5 F (37.5 C), height 5\' 5"  (1.651 m), weight 261 lb (118.4 kg), SpO2 96 %. Body mass index is 43.43 kg/m.  General: Cooperative, alert, well developed, in no acute distress. HEENT: Conjunctivae and lids unremarkable. Cardiovascular: Regular rhythm.  Lungs: Normal work of breathing. Neurologic: No focal deficits.  Lab Results  Component Value Date   CREATININE 0.96 06/11/2020   BUN 13 06/11/2020   NA 140 06/11/2020   K 4.6 06/11/2020   CL 101 06/11/2020   CO2 23 06/11/2020   Lab Results  Component Value Date   ALT 15 06/11/2020   AST 9 06/11/2020   ALKPHOS 66 06/11/2020   BILITOT 0.2 06/11/2020   Lab Results  Component Value Date   HGBA1C 5.4 06/11/2020   HGBA1C 5.6 10/09/2019   HGBA1C 5.3  02/02/2019   HGBA1C 5.5 11/01/2018   HGBA1C 5.5 07/06/2017   Lab Results  Component Value Date   INSULIN 17.5 06/11/2020   INSULIN 25.3 (H) 02/02/2019   INSULIN 23.9 11/01/2018   Lab Results  Component Value Date   TSH 1.820 02/02/2019   Lab Results  Component Value Date   CHOL 115 10/09/2019   HDL 51.60 10/09/2019   LDLCALC 36 10/09/2019   TRIG 140.0 10/09/2019   CHOLHDL 2 10/09/2019   Lab Results  Component Value Date   WBC 9.2 10/09/2019   HGB 13.9 02/28/2020   HCT 40.1 10/09/2019   MCV 93.8 10/09/2019   PLT 370.0 10/09/2019     Attestation Statements:   Reviewed by clinician on day of visit: allergies, medications, problem list, medical history, surgical history, family history, social history, and previous encounter notes.  Edmund Hilda, CMA, am acting as Energy manager for [place], FNP.  I have reviewed the above documentation for accuracy and completeness, and I agree with the above. -  Jesse Sans, FNP

## 2020-06-21 ENCOUNTER — Telehealth (INDEPENDENT_AMBULATORY_CARE_PROVIDER_SITE_OTHER): Payer: BC Managed Care – PPO | Admitting: Family Medicine

## 2020-06-21 ENCOUNTER — Encounter: Payer: Self-pay | Admitting: Family Medicine

## 2020-06-21 VITALS — Temp 100.0°F

## 2020-06-21 DIAGNOSIS — H9201 Otalgia, right ear: Secondary | ICD-10-CM | POA: Diagnosis not present

## 2020-06-21 DIAGNOSIS — J22 Unspecified acute lower respiratory infection: Secondary | ICD-10-CM | POA: Diagnosis not present

## 2020-06-21 MED ORDER — CEFDINIR 300 MG PO CAPS: 300.0000 mg | ORAL_CAPSULE | Freq: Two times a day (BID) | ORAL | 0 refills | Status: DC

## 2020-06-21 NOTE — Progress Notes (Signed)
Virtual Visit via Video Note  I connected with [name] on 06/21/20 at 10:54 AM by a video enabled telemedicine application and verified that I am speaking with the correct person using two identifiers.  Patient location:home My location: office - [place]   I discussed the limitations, risks, security and privacy concerns of performing an evaluation and management service by telephone and the availability of in person appointments. I also discussed with the patient that there may be a patient responsible charge related to this service. The patient expressed understanding and agreed to proceed, consent obtained  Chief complaint:  Chief Complaint  Patient presents with  . Ear Fullness    Pt reports congestion in her head, fullness in her ears, had 100 temp, started Monday morning, sore throat hoarseness has worsened since then. Vaccinated for COVID and has taken 3 home tests all negative.     History of Present Illness: [name] is a 36 y.o. female  Chest congestion, ear fullness.  Started 4 days ago - sore throat, ear pain on R. progressed to green mucus, increased phlegm and cough.persistent ear pain. R ear muffled. No drainage/dishcarge.  Temp 100 3 days ago. Subjective fever yesterday, but 98 on thermometer.  No known sick contacts. No exposure to Covid 19 known. Had covid infection in February.  Had negative home covid test 4 days ago,3 days ago, and again yesterday.  No dyspnea- only with coughing fit.  Has albuterol if needed - 2-3 uses this week.  No rash.  Palpitations with azithromycin, stomach upset with Augmentin in past.   Tx; mucinex, tylenol.   Patient Active Problem List   Diagnosis Date Noted  . Pre-diabetes 06/12/2020  . Vitamin D deficiency 06/12/2020  . Insulin resistance 04/18/2019  . Class 3 severe obesity with serious comorbidity and body mass index (BMI) of 45.0 to 49.9 in adult [facility]) 04/18/2019  . Elevated TSH 07/19/2017  . Fatigue  07/19/2017  . Visit for preventive health examination 05/05/2016  . Depression 01/18/2016  . Insomnia 01/18/2016  . Chronic migraine 01/18/2016  . CARDIOMEGALY 06/13/2009   Past Medical History:  Diagnosis Date  . Allergy   . Anxiety   . Back pain   . Constipation   . Depression   . Dyspnea   . Gallbladder problem   . GERD (gastroesophageal reflux disease)   . IBS (irritable bowel syndrome)   . Interstitial cystitis   . Lactose intolerance   . Migraine   . Overactive bladder   . Palpitations   . PCOS (polycystic ovarian syndrome)   . Pelvic pain   . Pseudotumor cerebri   . Pseudotumor cerebri   . SUI (stress urinary incontinence, female)   . Urinary incontinence    Past Surgical History:  Procedure Laterality Date  . CESAREAN SECTION  2009  &  2011   2011 W/ BILATERAL TUBAL LIGATION  . CHOLECYSTECTOMY  2010  . CYSTO WITH HYDRODISTENSION  03/01/2012   Procedure: CYSTOSCOPY/HYDRODISTENSION;  Surgeon: Martina Sinner, MD;  Location[facility];  Service: Urology;  Laterality: N/A;  INSTILLATION of marcaine and pyridium.  . PUBOVAGINAL SLING N/A 02/14/2013   Procedure: PUBO-VAGINAL SLING AND HYDRODISTENSION;  Surgeon: Martina Sinner, MD;  Location: [facility];  Service: Urology;  Laterality: N/A;  . TRANSTHORACIC ECHOCARDIOGRAM  06/2009  . TUBAL LIGATION     No Known Allergies Prior to Admission medications   Medication Sig Start Date End Date Taking? Authorizing Provider  amitriptyline (  ELAVIL) 25 MG tablet TAKE 2 TABLETS(50 MG) BY MOUTH AT BEDTIME 04/10/20  Yes Allwardt, Alyssa M, PA-C  Biotin 5000 MCG CAPS Take 1 capsule by mouth daily.   Yes [provider]  buPROPion (WELLBUTRIN SR) 200 MG 12 hr tablet Take 1 tablet (200 mg total) by mouth 2 (two) times daily. 06/11/20  Yes Whitmire, Dawn W, FNP  butalbital-acetaminophen-caffeine (FIORICET, ESGIC) 50-325-40 MG per tablet Take 1 tablet by mouth 2 (two) times daily as needed for  headache.   Yes [provider]  cetirizine (ZYRTEC) 10 MG tablet Take 10 mg by mouth daily.   Yes [provider]  Cholecalciferol (VITAMIN D) 125 MCG (5000 UT) CAPS Take 1 capsule by mouth daily. 06/11/20  Yes Whitmire, Dawn W, FNP  Dulaglutide (TRULICITY) 1.5 MG/0.5ML SOPN Inject 1.5 mg into the skin once a week. 06/11/20  Yes Whitmire, Dawn W, FNP  fluticasone (FLONASE) 50 MCG/ACT nasal spray Place 2 sprays into both nostrils daily. 04/20/18  Yes Hawks, Christy A, FNP  hydrOXYzine (ATARAX/VISTARIL) 25 MG tablet TAKE 1 TABLET(25 MG) BY MOUTH AT BEDTIME 04/24/20  Yes Allwardt, Alyssa M, PA-C  Insulin Pen Needle (BD PEN NEEDLE NANO 2ND GEN) 32G X 4 MM MISC Use 1 needle daily to inject Saxenda. 01/29/20  Yes Whitmire, Dawn W, FNP  Melatonin 10 MG TABS Take 10 mg by mouth at bedtime.    Yes [provider]  metFORMIN (GLUCOPHAGE) 500 MG tablet Take 1 tablet (500 mg total) by mouth 2 (two) times daily with a meal. 06/11/20  Yes Whitmire, Dawn W, FNP  Multiple Vitamin (MULTIVITAMIN) tablet Take 1 tablet by mouth daily.   Yes [provider]  spironolactone (ALDACTONE) 100 MG tablet Take 100 mg by mouth daily. 12/03/15  Yes [provider]  SPRINTEC 28 0.25-35 MG-MCG tablet Take 1 tablet by mouth daily. 12/01/15  Yes [provider]  venlafaxine XR (EFFEXOR-XR) 150 MG 24 hr capsule TAKE 1 CAPSULE(150 MG) BY MOUTH DAILY WITH BREAKFAST 11/20/19  Yes Waldon Merl, PA-C   Social History   Socioeconomic History  . Marital status: Married    Spouse name: [name]  . Number of children: 2  . Years of education: Not on file  . Highest education level: Not on file  Occupational History  . Occupation: Designer, jewellery    Employer: [employer]  Tobacco Use  . Smoking status: Never Smoker  . Smokeless tobacco: Never Used  Vaping Use  . Vaping Use: Never used  Substance and Sexual Activity  . Alcohol use: No  . Drug use: No  . Sexual  activity: Yes    Birth control/protection: Surgical  Other Topics Concern  . Not on file  Social History Narrative  . Not on file   Social Determinants of Health   Financial Resource Strain: Not on file  Food Insecurity: Not on file  Transportation Needs: Not on file  Physical Activity: Not on file  Stress: Not on file  Social Connections: Not on file  Intimate Partner Violence: Not on file    Observations/Objective: Vitals:   06/21/20 1011  Temp: 100 F (37.8 C)  TempSrc: Oral  Nontoxic appearance on video, speaking in full sentences, no respiratory distress.  Euthymic mood.  Coherent responses, all questions were answered with understanding of plan expressed.  Assessment and Plan: LRTI (lower respiratory tract infection) - Plan: cefdinir (OMNICEF) 300 MG capsule  Right ear pain  -Multiple negative COVID test reassuring, also has been vaccinated/boosted,  as well as COVID infection few months ago.  Option of 1 additional test and if positive timing of meds discussed.  Again unlikely COVID infection.  -With fever, discolored phlegm, worsening cough differential includes lower respiratory tract infection or community-acquired pneumonia.  Possible early R acute otitis media.   -Start Omnicef.  Potential side effects discussed.  Symptomatic care with Mucinex, RTC precautions given.  Follow Up Instructions: As needed.    I discussed the assessment and treatment plan with the patient. The patient was provided an opportunity to ask questions and all were answered. The patient agreed with the plan and demonstrated an understanding of the instructions.   The patient was advised to call back or seek an in-person evaluation if the symptoms worsen or if the condition fails to improve as anticipated.  I provided 14 minutes of non-face-to-face time during this encounter.   Shade Flood, MD

## 2020-07-08 ENCOUNTER — Other Ambulatory Visit (INDEPENDENT_AMBULATORY_CARE_PROVIDER_SITE_OTHER): Payer: Self-pay | Admitting: Family Medicine

## 2020-07-08 DIAGNOSIS — R7303 Prediabetes: Secondary | ICD-10-CM

## 2020-07-09 ENCOUNTER — Encounter: Payer: Self-pay | Admitting: Physician Assistant

## 2020-07-10 ENCOUNTER — Other Ambulatory Visit: Payer: Self-pay

## 2020-07-10 DIAGNOSIS — F32A Depression, unspecified: Secondary | ICD-10-CM

## 2020-07-10 MED ORDER — VENLAFAXINE HCL ER 150 MG PO CP24: ORAL_CAPSULE | ORAL | 1 refills | Status: DC

## 2020-07-15 ENCOUNTER — Encounter (INDEPENDENT_AMBULATORY_CARE_PROVIDER_SITE_OTHER): Payer: Self-pay | Admitting: Family Medicine

## 2020-07-15 DIAGNOSIS — R7303 Prediabetes: Secondary | ICD-10-CM

## 2020-07-16 ENCOUNTER — Other Ambulatory Visit (INDEPENDENT_AMBULATORY_CARE_PROVIDER_SITE_OTHER): Payer: Self-pay

## 2020-07-16 ENCOUNTER — Ambulatory Visit (INDEPENDENT_AMBULATORY_CARE_PROVIDER_SITE_OTHER): Payer: BC Managed Care – PPO | Admitting: Family Medicine

## 2020-07-16 DIAGNOSIS — R7303 Prediabetes: Secondary | ICD-10-CM

## 2020-07-16 MED ORDER — TRULICITY 1.5 MG/0.5ML ~~LOC~~ SOAJ: 1.5000 mg | SUBCUTANEOUS | 0 refills | Status: DC

## 2020-07-16 NOTE — Telephone Encounter (Signed)
Refill request

## 2020-07-30 ENCOUNTER — Other Ambulatory Visit: Payer: Self-pay

## 2020-07-30 ENCOUNTER — Encounter (INDEPENDENT_AMBULATORY_CARE_PROVIDER_SITE_OTHER): Payer: Self-pay | Admitting: Family Medicine

## 2020-07-30 ENCOUNTER — Ambulatory Visit (INDEPENDENT_AMBULATORY_CARE_PROVIDER_SITE_OTHER): Payer: BC Managed Care – PPO | Admitting: Family Medicine

## 2020-07-30 VITALS — BP 107/75 | HR 125 | Temp 97.9°F | Ht 65.0 in | Wt 265.0 lb

## 2020-07-30 DIAGNOSIS — Z6841 Body Mass Index (BMI) 40.0 and over, adult: Secondary | ICD-10-CM

## 2020-07-30 DIAGNOSIS — R7303 Prediabetes: Secondary | ICD-10-CM

## 2020-07-30 DIAGNOSIS — F3289 Other specified depressive episodes: Secondary | ICD-10-CM

## 2020-07-30 MED ORDER — METFORMIN HCL 500 MG PO TABS: 500.0000 mg | ORAL_TABLET | Freq: Two times a day (BID) | ORAL | 0 refills | Status: DC

## 2020-08-01 ENCOUNTER — Encounter (INDEPENDENT_AMBULATORY_CARE_PROVIDER_SITE_OTHER): Payer: Self-pay | Admitting: Family Medicine

## 2020-08-01 NOTE — Progress Notes (Signed)
Chief Complaint:   OBESITY [name] is here to discuss her progress with her obesity treatment plan along with follow-up of her obesity related diagnoses. [name] is on keeping a food journal and adhering to recommended goals of 1200-1800 calories and 100 g protein and states she is following her eating plan approximately 50% of the time. [name] states she is not currently exercising.  Today's visit was #: 26 Starting weight: 282 lbs Starting date: 11/01/2018 Today's weight: 265 lbs Today's date: 07/30/2020 Total lbs lost to date: 17 Total lbs lost since last in-office visit: +4  Interim History: Trulicity is $140 per month which is unaffordable for [place]. She has not journaled. She wants to journal but finds it hard to adhere to this. She feels her weight always gets stuck at 260 lbs, no matter what she does.  Subjective:   1. Pre-diabetes [name] is unable to afford Trulicity at $140/month. She reports polyphagia.  Lab Results  Component Value Date   HGBA1C 5.4 06/11/2020   Lab Results  Component Value Date   INSULIN 17.5 06/11/2020   INSULIN 25.3 (H) 02/02/2019   INSULIN 23.9 11/01/2018   2. Other depression, with emotional eating [name]'s mood is stable. She struggles with food cravings and sabotage from family. She is on bupropion and Effexor.  Assessment/Plan:   1. Pre-diabetes We will refill Metformin 500 mg, as prescribed below. Discontinue Trulicity.   Refill- metFORMIN (GLUCOPHAGE) 500 MG tablet; Take 1 tablet (500 mg total) by mouth 2 (two) times daily with a meal.  Dispense: 180 tablet; Refill: 0  2. Other depression, with emotional eating Continue bupropion and Effexor.  3. Obesity: BMI 44.1  [name] is currently in the action stage of change. As such, her goal is to continue with weight loss efforts. She has agreed to the Category 3 Plan.   We discussed weight loss surgery in depth and [name] is open to this. Information was provided to sign  up for the bariatric surgery seminar [facility] Surgery seminar). Patient was advised that bariatric surgery is a tool for weight loss and will not be successful long-term without significant dietary changes and exercise.  Schedule with Dr. Earlene Plater to discuss starting Phentermine. Switch to category 3 for more structure.  Exercise goals: No exercise has been prescribed at this time.  Behavioral modification strategies: increasing lean protein intake and decreasing simple carbohydrates.  [name] has agreed to follow-up with our clinic in 3 weeks.  Objective:   Blood pressure 107/75, pulse (!) 125, temperature 97.9 F (36.6 C), height 5\' 5"  (1.651 m), weight 265 lb (120.2 kg), SpO2 97 %. Body mass index is 44.1 kg/m.  General: Cooperative, alert, well developed, in no acute distress. HEENT: Conjunctivae and lids unremarkable. Cardiovascular: Regular rhythm.  Lungs: Normal work of breathing. Neurologic: No focal deficits.   Lab Results  Component Value Date   CREATININE 0.96 06/11/2020   BUN 13 06/11/2020   NA 140 06/11/2020   K 4.6 06/11/2020   CL 101 06/11/2020   CO2 23 06/11/2020   Lab Results  Component Value Date   ALT 15 06/11/2020   AST 9 06/11/2020   ALKPHOS 66 06/11/2020   BILITOT 0.2 06/11/2020   Lab Results  Component Value Date   HGBA1C 5.4 06/11/2020   HGBA1C 5.6 10/09/2019   HGBA1C 5.3 02/02/2019   HGBA1C 5.5 11/01/2018   HGBA1C 5.5 07/06/2017   Lab Results  Component Value Date   INSULIN 17.5 06/11/2020   INSULIN  25.3 (H) 02/02/2019   INSULIN 23.9 11/01/2018   Lab Results  Component Value Date   TSH 1.820 02/02/2019   Lab Results  Component Value Date   CHOL 115 10/09/2019   HDL 51.60 10/09/2019   LDLCALC 36 10/09/2019   TRIG 140.0 10/09/2019   CHOLHDL 2 10/09/2019   Lab Results  Component Value Date   VD25OH 49.0 02/02/2019   VD25OH 78.6 11/01/2018   VD25OH 51.90 07/19/2017   Lab Results  Component Value Date    WBC 9.2 10/09/2019   HGB 13.9 02/28/2020   HCT 40.1 10/09/2019   MCV 93.8 10/09/2019   PLT 370.0 10/09/2019   No results found for: IRON, TIBC, FERRITIN  Attestation Statements:   Reviewed by clinician on day of visit: allergies, medications, problem list, medical history, surgical history, family history, social history, and previous encounter notes.  Edmund Hilda, CMA, am acting as Energy manager for [place], FNP.  I have reviewed the above documentation for accuracy and completeness, and I agree with the above. -  Jesse Sans, FNP

## 2020-08-26 ENCOUNTER — Encounter: Payer: Self-pay | Admitting: Physician Assistant

## 2020-08-28 ENCOUNTER — Encounter (INDEPENDENT_AMBULATORY_CARE_PROVIDER_SITE_OTHER): Payer: Self-pay | Admitting: Family Medicine

## 2020-08-28 ENCOUNTER — Other Ambulatory Visit: Payer: Self-pay

## 2020-08-28 ENCOUNTER — Ambulatory Visit (INDEPENDENT_AMBULATORY_CARE_PROVIDER_SITE_OTHER): Payer: BC Managed Care – PPO | Admitting: Family Medicine

## 2020-08-28 VITALS — BP 116/78 | HR 107 | Temp 98.2°F | Ht 65.0 in | Wt 266.0 lb

## 2020-08-28 DIAGNOSIS — Z9189 Other specified personal risk factors, not elsewhere classified: Secondary | ICD-10-CM | POA: Diagnosis not present

## 2020-08-28 DIAGNOSIS — F3289 Other specified depressive episodes: Secondary | ICD-10-CM

## 2020-08-28 DIAGNOSIS — R7301 Impaired fasting glucose: Secondary | ICD-10-CM | POA: Diagnosis not present

## 2020-08-28 DIAGNOSIS — R632 Polyphagia: Secondary | ICD-10-CM | POA: Diagnosis not present

## 2020-08-28 DIAGNOSIS — R7303 Prediabetes: Secondary | ICD-10-CM | POA: Diagnosis not present

## 2020-08-28 DIAGNOSIS — Z6841 Body Mass Index (BMI) 40.0 and over, adult: Secondary | ICD-10-CM

## 2020-08-28 MED ORDER — TIRZEPATIDE 5 MG/0.5ML ~~LOC~~ SOAJ: 5.0000 mg | SUBCUTANEOUS | 0 refills | Status: DC

## 2020-08-28 MED ORDER — BUPROPION HCL ER (SR) 200 MG PO TB12: 200.0000 mg | ORAL_TABLET | Freq: Two times a day (BID) | ORAL | 0 refills | Status: DC

## 2020-08-28 MED ORDER — METFORMIN HCL 500 MG PO TABS: 500.0000 mg | ORAL_TABLET | Freq: Two times a day (BID) | ORAL | 0 refills | Status: DC

## 2020-08-28 MED ORDER — PHENTERMINE HCL 37.5 MG PO TABS: 37.5000 mg | ORAL_TABLET | Freq: Every day | ORAL | 0 refills | Status: DC

## 2020-09-02 ENCOUNTER — Encounter: Payer: Self-pay | Admitting: Physician Assistant

## 2020-09-02 NOTE — Telephone Encounter (Signed)
What are the policies in place for students coming into the clinic?

## 2020-09-02 NOTE — Progress Notes (Signed)
Chief Complaint:   OBESITY [name] is here to discuss her progress with her obesity treatment plan along with follow-up of her obesity related diagnoses.   Today's visit was #: 27 Starting weight: 282 lbs Starting date: 11/01/2018 Today's weight: 266 lbs Today's date: 08/28/2020 Weight change since last visit: +1 lbs Total lbs lost to date: 16 lbs Body mass index is 44.26 kg/m.  Total weight loss percentage to date: -5.67%  Interim History:  [name] says she is frustrated with her lack of weight loss.  She says that her insurance plan will not cover anti-obesity medications or bariatric surgery.  Current Meal Plan: the Category 3 Plan for 0% of the time.  Current Exercise Plan: None.  Assessment/Plan:   1. Polyphagia Not at goal. Current treatment: None. She will continue to focus on protein-rich, low simple carbohydrate foods. We reviewed the importance of hydration, regular exercise for stress reduction, and restorative sleep.  Plan:  Start phentermine 37.5 mg daily, as per below.  This patient 1) has no evidence of serious cardiovascular disease; 2) does not have serious psychiatric disease or a history of substance abuse; 3) has been informed about weight loss medications that are FDA-approved for long term use whereas phentermine is not.  Patient understands that all anti-obesity medications are contraindicated in pregnancy. Patient denies a history of glaucoma. Patient understands that long-term use of phentermine is considered off-label use of this medication, however, that the Endocrine Society and recent research supports that long-term use of phentermine does not appear to have detrimental health effects if it is used in an appropriate patient. In addition, a 2019 study published in Obesity Journal on [id number] patients concluded that "recommendations to limit phentermine to less than 3 months do not align with current concepts of pharmacologic treatment of obesity", and  that "long term phentermine users experience greater weight loss without apparent increases in cardiovascular risk".  We reviewed potential side effects including insomnia, dry mouth, increased heart rate and blood pressure, and increased anxiety. We reviewed reducing caffeine consumption while taking phentermine, especially if the patient is experiencing side effects. Alternative treatment options were discussed. All questions were answered, and the patient wishes to move forward with this medication.  I have consulted the [place] Controlled Substances Registry for this patient, and feel the risk/benefit ratio today is favorable for proceeding with this prescription for a controlled substance. The patient understands monitoring parameters and red flags.   - Start phentermine (ADIPEX-P) 37.5 MG tablet; Take 1 tablet (37.5 mg total) by mouth daily.  Dispense: 30 tablet; Refill: 0  2. Impaired fasting glucose Start [name] Mounjaro 5 mg subcutaneously weekly, as per below.  - Start tirzepatide [facility]) 5 MG/0.5ML Pen; Inject 5 mg into the skin once a week.  Dispense: 6 mL; Refill: 0  3. Pre-diabetes At goal. Goal is HgbA1c < 5.7.  Medication: metformin 500 mg twice daily.    Plan:  She will continue to focus on protein-rich, low simple carbohydrate foods. We reviewed the importance of hydration, regular exercise for stress reduction, and restorative sleep.   Lab Results  Component Value Date   HGBA1C 5.4 06/11/2020   Lab Results  Component Value Date   INSULIN 17.5 06/11/2020   INSULIN 25.3 (H) 02/02/2019   INSULIN 23.9 11/01/2018   - Refill metFORMIN (GLUCOPHAGE) 500 MG tablet; Take 1 tablet (500 mg total) by mouth 2 (two) times daily with a meal.  Dispense: 180 tablet; Refill: 0  4. Other depression, with  emotional eating Not at goal. Medication: Wellbutrin 200 mg twice daily and Effexor XR 150 mg daily.  Plan:  Refill Wellbutrin today.  Continue Effexor and Wellbutrin at current  doses.  Discussed cues and consequences, how thoughts affect eating, model of thoughts, feelings, and behaviors, and strategies for change by focusing on the cue. Discussed cognitive distortions, coping thoughts, and how to change your thoughts.  - Refill buPROPion (WELLBUTRIN SR) 200 MG 12 hr tablet; Take 1 tablet (200 mg total) by mouth 2 (two) times daily.  Dispense: 180 tablet; Refill: 0  5. At risk for heart disease Due to [name]'s current state of health and medical condition(s), she is at a higher risk for heart disease.  This puts the patient at much greater risk to subsequently develop cardiopulmonary conditions that can significantly affect patient's quality of life in a negative manner.    At least 8 minutes were spent on counseling [name] about these concerns today. Evidence-based interventions for health behavior change were utilized today including the discussion of self monitoring techniques, problem-solving barriers, and SMART goal setting techniques.  Specifically, regarding patient's less desirable eating habits and patterns, we employed the technique of small changes when [name] has not been able to fully commit to her prudent nutritional plan.  6. Obesity, current BMI 44.3  Course: [name] is currently in the action stage of change. As such, her goal is to continue with weight loss efforts.   Nutrition goals: She has agreed to practicing portion control and making smarter food choices, such as increasing vegetables and decreasing simple carbohydrates.   Exercise goals: For substantial health benefits, adults should do at least 150 minutes (2 hours and 30 minutes) a week of moderate-intensity, or 75 minutes (1 hour and 15 minutes) a week of vigorous-intensity aerobic physical activity, or an equivalent combination of moderate- and vigorous-intensity aerobic activity. Aerobic activity should be performed in episodes of at least 10 minutes, and preferably, it should be spread  throughout the week.  Behavioral modification strategies: increasing lean protein intake, decreasing simple carbohydrates, increasing vegetables, increasing water intake, and emotional eating strategies.  [name] has agreed to follow-up with our clinic in 4 weeks. She was informed of the importance of frequent follow-up visits to maximize her success with intensive lifestyle modifications for her multiple health conditions.   Objective:   Blood pressure 116/78, pulse (!) 107, temperature 98.2 F (36.8 C), temperature source Oral, height 5\' 5"  (1.651 m), weight 266 lb (120.7 kg), SpO2 95 %. Body mass index is 44.26 kg/m.  General: Cooperative, alert, well developed, in no acute distress. HEENT: Conjunctivae and lids unremarkable. Cardiovascular: Regular rhythm.  Lungs: Normal work of breathing. Neurologic: No focal deficits.   Lab Results  Component Value Date   CREATININE 0.96 06/11/2020   BUN 13 06/11/2020   NA 140 06/11/2020   K 4.6 06/11/2020   CL 101 06/11/2020   CO2 23 06/11/2020   Lab Results  Component Value Date   ALT 15 06/11/2020   AST 9 06/11/2020   ALKPHOS 66 06/11/2020   BILITOT 0.2 06/11/2020   Lab Results  Component Value Date   HGBA1C 5.4 06/11/2020   HGBA1C 5.6 10/09/2019   HGBA1C 5.3 02/02/2019   HGBA1C 5.5 11/01/2018   HGBA1C 5.5 07/06/2017   Lab Results  Component Value Date   INSULIN 17.5 06/11/2020   INSULIN 25.3 (H) 02/02/2019   INSULIN 23.9 11/01/2018   Lab Results  Component Value Date   TSH 1.820 02/02/2019  Lab Results  Component Value Date   CHOL 115 10/09/2019   HDL 51.60 10/09/2019   LDLCALC 36 10/09/2019   TRIG 140.0 10/09/2019   CHOLHDL 2 10/09/2019   Lab Results  Component Value Date   VD25OH 49.0 02/02/2019   VD25OH 78.6 11/01/2018   VD25OH 51.90 07/19/2017   Lab Results  Component Value Date   WBC 9.2 10/09/2019   HGB 13.9 02/28/2020   HCT 40.1 10/09/2019   MCV 93.8 10/09/2019   PLT 370.0 10/09/2019    Attestation Statements:   Reviewed by clinician on day of visit: allergies, medications, problem list, medical history, surgical history, family history, social history, and previous encounter notes.  I, Insurance claims handler, CMA, am acting as transcriptionist for Helane Rima, DO  I have reviewed the above documentation for accuracy and completeness, and I agree with the above. -

## 2020-09-03 ENCOUNTER — Encounter: Payer: Self-pay | Admitting: Physician Assistant

## 2020-09-05 MED ORDER — HYDROXYZINE HCL 25 MG PO TABS: ORAL_TABLET | ORAL | 2 refills | Status: DC

## 2020-09-17 ENCOUNTER — Encounter (INDEPENDENT_AMBULATORY_CARE_PROVIDER_SITE_OTHER): Payer: Self-pay

## 2020-09-17 ENCOUNTER — Ambulatory Visit (INDEPENDENT_AMBULATORY_CARE_PROVIDER_SITE_OTHER): Payer: BC Managed Care – PPO | Admitting: Family Medicine

## 2020-09-18 ENCOUNTER — Ambulatory Visit (INDEPENDENT_AMBULATORY_CARE_PROVIDER_SITE_OTHER): Payer: BC Managed Care – PPO | Admitting: Family Medicine

## 2020-10-01 ENCOUNTER — Ambulatory Visit (INDEPENDENT_AMBULATORY_CARE_PROVIDER_SITE_OTHER): Payer: BC Managed Care – PPO | Admitting: Family Medicine

## 2020-10-01 ENCOUNTER — Other Ambulatory Visit: Payer: Self-pay

## 2020-10-01 ENCOUNTER — Encounter (INDEPENDENT_AMBULATORY_CARE_PROVIDER_SITE_OTHER): Payer: Self-pay | Admitting: Family Medicine

## 2020-10-01 VITALS — BP 115/65 | HR 87 | Temp 98.3°F | Ht 65.0 in | Wt 260.0 lb

## 2020-10-01 DIAGNOSIS — R7303 Prediabetes: Secondary | ICD-10-CM

## 2020-10-01 DIAGNOSIS — F3289 Other specified depressive episodes: Secondary | ICD-10-CM

## 2020-10-01 DIAGNOSIS — Z6841 Body Mass Index (BMI) 40.0 and over, adult: Secondary | ICD-10-CM

## 2020-10-01 MED ORDER — TIRZEPATIDE 5 MG/0.5ML ~~LOC~~ SOAJ: 5.0000 mg | SUBCUTANEOUS | 0 refills | Status: DC

## 2020-10-01 MED ORDER — METFORMIN HCL 500 MG PO TABS: 500.0000 mg | ORAL_TABLET | Freq: Two times a day (BID) | ORAL | 0 refills | Status: DC

## 2020-10-01 MED ORDER — BUPROPION HCL ER (SR) 200 MG PO TB12: 200.0000 mg | ORAL_TABLET | Freq: Two times a day (BID) | ORAL | 0 refills | Status: DC

## 2020-10-01 NOTE — Progress Notes (Signed)
Chief Complaint:   OBESITY [name] is here to discuss her progress with her obesity treatment plan along with follow-up of her obesity related diagnoses. [name] is on the Category 3 Plan and the Very Low Calorie Protein Sparing Modified Fast Plan and states she is following her eating plan approximately 75% of the time. [name] states she is doing 0 minutes 0 times per week.  Today's visit was #: 28 Starting weight: 282 lbs Starting date: 11/01/2018 Today's weight: 260 lbs Today's date: 10/01/2020 Total lbs lost to date: 22 lbs Total lbs lost since last in-office visit: 6 lbs  Interim History: [name] notes appetite is well controlled on Mounjaro and Phentermine. She doubts that she is getting her protein in. She has a protein shake once daily.  Subjective:   1. Pre-diabetes [name] is on Mounjaro 5 mg. She notes diarrhea. She denies nausea.   Lab Results  Component Value Date   HGBA1C 5.4 06/11/2020   Lab Results  Component Value Date   INSULIN 17.5 06/11/2020   INSULIN 25.3 (H) 02/02/2019   INSULIN 23.9 11/01/2018    2. Other depression, with emotional eating [name]'s cravings are well controlled on Mounjaro and Phentermine.   Assessment/Plan:   1. Pre-diabetes We will refill Mounjaro 5 mg weekly and Metformin 500 mg twice daily for 1 month with no refills. Channell will continue to work on weight loss, exercise, and decreasing simple carbohydrates to help decrease the risk of diabetes.   - metFORMIN (GLUCOPHAGE) 500 MG tablet; Take 1 tablet (500 mg total) by mouth 2 (two) times daily with a meal.  Dispense: 180 tablet; Refill: 0  - tirzepatide (MOUNJARO) 5 MG/0.5ML Pen; Inject 5 mg into the skin once a week.  Dispense: 6 mL; Refill: 0  2. Other depression, with emotional eating Behavior modification techniques were discussed today to help [name] deal with her emotional/non-hunger eating behaviors.   - buPROPion (WELLBUTRIN SR) 200 MG 12 hr tablet; Take  1 tablet (200 mg total) by mouth 2 (two) times daily.  Dispense: 180 tablet; Refill: 0  3. Obesity, current BMI 44.3 [name] is currently in the action stage of change. As such, her goal is to continue with weight loss efforts. She has agreed to the Category 3 Plan.   [name] will have one protein shake per day.  Exercise goals: All adults should avoid inactivity. Some physical activity is better than none, and adults who participate in any amount of physical activity gain some health benefits.  Behavioral modification strategies: increasing lean protein intake.  Georgeanne has agreed to follow-up with our clinic in 3-4 weeks.  Objective:   Blood pressure 115/65, pulse 87, temperature 98.3 F (36.8 C), temperature source Oral, height 5\' 5"  (1.651 m), weight 260 lb (117.9 kg), SpO2 98 %. Body mass index is 43.27 kg/m.  General: Cooperative, alert, well developed, in no acute distress. HEENT: Conjunctivae and lids unremarkable. Cardiovascular: Regular rhythm.  Lungs: Normal work of breathing. Neurologic: No focal deficits.   Lab Results  Component Value Date   CREATININE 0.96 06/11/2020   BUN 13 06/11/2020   NA 140 06/11/2020   K 4.6 06/11/2020   CL 101 06/11/2020   CO2 23 06/11/2020   Lab Results  Component Value Date   ALT 15 06/11/2020   AST 9 06/11/2020   ALKPHOS 66 06/11/2020   BILITOT 0.2 06/11/2020   Lab Results  Component Value Date   HGBA1C 5.4 06/11/2020   HGBA1C 5.6 10/09/2019   HGBA1C  5.3 02/02/2019   HGBA1C 5.5 11/01/2018   HGBA1C 5.5 07/06/2017   Lab Results  Component Value Date   INSULIN 17.5 06/11/2020   INSULIN 25.3 (H) 02/02/2019   INSULIN 23.9 11/01/2018   Lab Results  Component Value Date   TSH 1.820 02/02/2019   Lab Results  Component Value Date   CHOL 115 10/09/2019   HDL 51.60 10/09/2019   LDLCALC 36 10/09/2019   TRIG 140.0 10/09/2019   CHOLHDL 2 10/09/2019   Lab Results  Component Value Date   VD25OH 49.0 02/02/2019   VD25OH  78.6 11/01/2018   VD25OH 51.90 07/19/2017   Lab Results  Component Value Date   WBC 9.2 10/09/2019   HGB 13.9 02/28/2020   HCT 40.1 10/09/2019   MCV 93.8 10/09/2019   PLT 370.0 10/09/2019   No results found for: IRON, TIBC, FERRITIN  Attestation Statements:   Reviewed by clinician on day of visit: allergies, medications, problem list, medical history, surgical history, family history, social history, and previous encounter notes.  I, Jackson Latino, RMA, am acting as Energy manager for [place], FNP.   I have reviewed the above documentation for accuracy and completeness, and I agree with the above. -  Jesse Sans, FNP

## 2020-10-14 ENCOUNTER — Other Ambulatory Visit: Payer: Self-pay

## 2020-10-14 ENCOUNTER — Telehealth: Payer: Self-pay

## 2020-10-14 DIAGNOSIS — G4700 Insomnia, unspecified: Secondary | ICD-10-CM

## 2020-10-14 MED ORDER — AMITRIPTYLINE HCL 25 MG PO TABS: ORAL_TABLET | ORAL | 1 refills | Status: DC

## 2020-10-14 NOTE — Telephone Encounter (Signed)
Refill sent to pharmacy.   

## 2020-10-14 NOTE — Telephone Encounter (Signed)
MEDICATION: amitriptyline (ELAVIL) 25 MG tablet  PHARMACY:  [id number] - [place], [place] - [address] Phone:  [phone]  Fax:  [phone]      Comments:   **Let patient know to contact pharmacy at the end of the day to make sure medication is ready. **  ** Please notify patient to allow 48-72 hours to process**  **Encourage patient to contact the pharmacy for refills or they can request refills through [facility]**

## 2020-10-22 ENCOUNTER — Ambulatory Visit (INDEPENDENT_AMBULATORY_CARE_PROVIDER_SITE_OTHER): Payer: BC Managed Care – PPO | Admitting: Family Medicine

## 2020-10-24 ENCOUNTER — Other Ambulatory Visit: Payer: Self-pay

## 2020-10-24 ENCOUNTER — Encounter (INDEPENDENT_AMBULATORY_CARE_PROVIDER_SITE_OTHER): Payer: Self-pay | Admitting: Family Medicine

## 2020-10-24 ENCOUNTER — Ambulatory Visit (INDEPENDENT_AMBULATORY_CARE_PROVIDER_SITE_OTHER): Payer: BC Managed Care – PPO | Admitting: Family Medicine

## 2020-10-24 VITALS — BP 116/78 | HR 95 | Temp 98.2°F | Ht 65.0 in | Wt 261.0 lb

## 2020-10-24 DIAGNOSIS — Z6841 Body Mass Index (BMI) 40.0 and over, adult: Secondary | ICD-10-CM | POA: Diagnosis not present

## 2020-10-24 DIAGNOSIS — F3289 Other specified depressive episodes: Secondary | ICD-10-CM

## 2020-10-24 DIAGNOSIS — R7303 Prediabetes: Secondary | ICD-10-CM | POA: Diagnosis not present

## 2020-10-24 MED ORDER — TIRZEPATIDE 7.5 MG/0.5ML ~~LOC~~ SOAJ: 7.5000 mg | SUBCUTANEOUS | 0 refills | Status: DC

## 2020-10-24 MED ORDER — BUPROPION HCL ER (SR) 200 MG PO TB12: 200.0000 mg | ORAL_TABLET | Freq: Two times a day (BID) | ORAL | 0 refills | Status: DC

## 2020-10-24 NOTE — Progress Notes (Signed)
Chief Complaint:   OBESITY [name] is here to discuss her progress with her obesity treatment plan along with follow-up of her obesity related diagnoses. [name] is on the Category 3 Plan and states she is following her eating plan approximately 80% of the time. [name] states she is doing 0 minutes 0 times per week.  Today's visit was #: 29 Starting weight: 282 lbs Starting date: 11/01/2018 Today's weight: 261 lbs Today's date: 10/24/2020 Total lbs lost to date: 21 lbs Total lbs lost since last in-office visit: 0  Interim History: [name] is getting adequate protein with protein shakes and bars. 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 Her appetite is well controlled with Mounjaro 5 mg. She is also on Phentermine 37.5 mg (1/2 pill).  Subjective:   1. Pre-diabetes [name] is on Mounjaro 5 mg and notes good suppression. She notes diarrhea.  Lab Results  Component Value Date   HGBA1C 5.4 06/11/2020   Lab Results  Component Value Date   INSULIN 17.5 06/11/2020   INSULIN 25.3 (H) 02/02/2019   INSULIN 23.9 11/01/2018    2. Other depression, with emotional eating [name]'s mood is stable. Her cravings are well controlled. She is on bupropion and Effexor.  Assessment/Plan:   1. Pre-diabetes We will refill Mounjaro 7.5 mg weekly. She may use Metamucil daily and Imodium PRN. We will discontinue Metformin for now to see if diarrhea improves. [name] will continue to work on weight loss, exercise, and decreasing simple carbohydrates to help decrease the risk of diabetes.   - tirzepatide (MOUNJARO) 7.5 MG/0.5ML Pen; Inject 7.5 mg into the skin once a week.  Dispense: 6 mL; Refill: 0  2. Other depression, with emotional eating  She will continue Effexor and bupropion.  Refill:  - buPROPion (WELLBUTRIN SR) 200 MG 12 hr tablet; Take 1 tablet (200 mg total) by mouth 2 (two) times daily.  Dispense: 180 tablet; Refill: 0  3. Obesity,  current BMI 43.43 [name] is currently in the action stage of change. As such, her goal is to continue with weight loss efforts. She has agreed to the Category 3 Plan.   Exercise goals: No exercise has been prescribed at this time.  Behavioral modification strategies: increasing lean protein intake.  [name] has agreed to follow-up with our clinic in 3 weeks with Dr. Earlene Plater. She was informed of the importance of frequent follow-up visits to maximize her success with intensive lifestyle modifications for her multiple health conditions.   Objective:   Blood pressure 116/78, pulse 95, temperature 98.2 F (36.8 C), height 5\' 5"  (1.651 m), weight 261 lb (118.4 kg), SpO2 96 %. Body mass index is 43.43 kg/m.  General: Cooperative, alert, well developed, in no acute distress. HEENT: Conjunctivae and lids unremarkable. Cardiovascular: Regular rhythm.  Lungs: Normal work of breathing. Neurologic: No focal deficits.   Lab Results  Component Value Date   CREATININE 0.96 06/11/2020   BUN 13 06/11/2020   NA 140 06/11/2020   K 4.6 06/11/2020   CL 101 06/11/2020   CO2 23 06/11/2020   Lab Results  Component Value Date   ALT 15 06/11/2020   AST 9 06/11/2020   ALKPHOS 66 06/11/2020   BILITOT 0.2 06/11/2020   Lab Results  Component Value Date   HGBA1C 5.4 06/11/2020   HGBA1C 5.6 10/09/2019   HGBA1C 5.3 02/02/2019   HGBA1C 5.5 11/01/2018   HGBA1C 5.5 07/06/2017   Lab Results  Component Value Date   INSULIN  17.5 06/11/2020   INSULIN 25.3 (H) 02/02/2019   INSULIN 23.9 11/01/2018   Lab Results  Component Value Date   TSH 1.820 02/02/2019   Lab Results  Component Value Date   CHOL 115 10/09/2019   HDL 51.60 10/09/2019   LDLCALC 36 10/09/2019   TRIG 140.0 10/09/2019   CHOLHDL 2 10/09/2019   Lab Results  Component Value Date   VD25OH 49.0 02/02/2019   VD25OH 78.6 11/01/2018   VD25OH 51.90 07/19/2017   Lab Results  Component Value Date   WBC 9.2 10/09/2019   HGB 13.9  02/28/2020   HCT 40.1 10/09/2019   MCV 93.8 10/09/2019   PLT 370.0 10/09/2019   No results found for: IRON, TIBC, FERRITIN  Attestation Statements:   Reviewed by clinician on day of visit: allergies, medications, problem list, medical history, surgical history, family history, social history, and previous encounter notes.  I, Jackson Latino, RMA, am acting as Energy manager for [place], FNP.   I have reviewed the above documentation for accuracy and completeness, and I agree with the above. -  Jesse Sans, FNP

## 2020-11-12 ENCOUNTER — Ambulatory Visit (INDEPENDENT_AMBULATORY_CARE_PROVIDER_SITE_OTHER): Payer: BC Managed Care – PPO | Admitting: Family Medicine

## 2020-11-12 ENCOUNTER — Encounter (INDEPENDENT_AMBULATORY_CARE_PROVIDER_SITE_OTHER): Payer: Self-pay | Admitting: Family Medicine

## 2020-11-12 ENCOUNTER — Other Ambulatory Visit: Payer: Self-pay

## 2020-11-12 VITALS — BP 122/82 | HR 94 | Temp 98.2°F | Ht 65.0 in | Wt 257.0 lb

## 2020-11-12 DIAGNOSIS — N890 Mild vaginal dysplasia: Secondary | ICD-10-CM | POA: Diagnosis not present

## 2020-11-12 DIAGNOSIS — Z01419 Encounter for gynecological examination (general) (routine) without abnormal findings: Secondary | ICD-10-CM | POA: Diagnosis not present

## 2020-11-12 DIAGNOSIS — R7303 Prediabetes: Secondary | ICD-10-CM

## 2020-11-12 DIAGNOSIS — Z6841 Body Mass Index (BMI) 40.0 and over, adult: Secondary | ICD-10-CM | POA: Diagnosis not present

## 2020-11-12 DIAGNOSIS — R632 Polyphagia: Secondary | ICD-10-CM | POA: Diagnosis not present

## 2020-11-12 LAB — HM PAP SMEAR: HM Pap smear: NEGATIVE

## 2020-11-12 LAB — RESULTS CONSOLE HPV: CHL HPV: NEGATIVE

## 2020-11-12 MED ORDER — TIRZEPATIDE 7.5 MG/0.5ML ~~LOC~~ SOAJ: 7.5000 mg | SUBCUTANEOUS | 0 refills | Status: DC

## 2020-11-12 MED ORDER — PHENTERMINE HCL 37.5 MG PO TABS: 37.5000 mg | ORAL_TABLET | Freq: Every day | ORAL | 0 refills | Status: DC

## 2020-11-14 NOTE — Progress Notes (Signed)
Chief Complaint:   OBESITY [name] is here to discuss her progress with her obesity treatment plan along with follow-up of her obesity related diagnoses. See Medical Weight Management Flowsheet for complete bioelectrical impedance results.  Today's visit was #: 30 Starting weight: 282 lbs Starting date: 10/60/2020 Weight change since last visit: 4 lbs Total lbs lost to date: 25 lbs Total weight loss percentage to date: -8.87%  Nutrition Plan: Category 3 Meal plan for 75% of the time. Activity: None. Anti-obesity medications: phentermine 37.5 mg daily and Mounjaro 7.5 mg subcutaneously weekly. Reported side effects: None.  Interim History: [name] is down 25 pounds today.  She says she is tolerating Mounjaro.  Some GI effects on days 2-3 after injection.  Current Outpatient Medications on File Prior to Visit  Medication Sig Dispense Refill   amitriptyline (ELAVIL) 25 MG tablet TAKE 2 TABLETS(50 MG) BY MOUTH AT BEDTIME 180 tablet 1   Biotin 5000 MCG CAPS Take 1 capsule by mouth daily.     buPROPion (WELLBUTRIN SR) 200 MG 12 hr tablet Take 1 tablet (200 mg total) by mouth 2 (two) times daily. 180 tablet 0   cetirizine (ZYRTEC) 10 MG tablet Take 10 mg by mouth daily.     Cholecalciferol (VITAMIN D) 125 MCG (5000 UT) CAPS Take 1 capsule by mouth daily. 30 capsule 0   fluticasone (FLONASE) 50 MCG/ACT nasal spray Place 2 sprays into both nostrils daily. 16 g 6   hydrOXYzine (ATARAX/VISTARIL) 25 MG tablet TAKE 1 TABLET(25 MG) BY MOUTH AT BEDTIME 30 tablet 2   Melatonin 10 MG TABS Take 10 mg by mouth at bedtime.      metFORMIN (GLUCOPHAGE) 500 MG tablet Take 1 tablet (500 mg total) by mouth 2 (two) times daily with a meal. 180 tablet 0   Multiple Vitamin (MULTIVITAMIN) tablet Take 1 tablet by mouth daily.     spironolactone (ALDACTONE) 100 MG tablet Take 100 mg by mouth daily.  4   SPRINTEC 28 0.25-35 MG-MCG tablet Take 1 tablet by mouth daily.  4   venlafaxine XR (EFFEXOR-XR) 150 MG  24 hr capsule TAKE 1 CAPSULE(150 MG) BY MOUTH DAILY WITH BREAKFAST 90 capsule 1   No current facility-administered medications on file prior to visit.   Assessment/Plan:   1. Pre-diabetes At goal. Goal is HgbA1c < 5.7.  Medication: Mounjaro 7.5 mg subcutaneously weekly.    Plan:  Continue Mounjaro at current dose.  Will refill today, as per below.  She will continue to focus on protein-rich, low simple carbohydrate foods. We reviewed the importance of hydration, regular exercise for stress reduction, and restorative sleep.   Lab Results  Component Value Date   HGBA1C 5.4 06/11/2020   Lab Results  Component Value Date   INSULIN 17.5 06/11/2020   INSULIN 25.3 (H) 02/02/2019   INSULIN 23.9 11/01/2018   - Refill tirzepatide (MOUNJARO) 7.5 MG/0.5ML Pen; Inject 7.5 mg into the skin once a week.  Dispense: 6 mL; Refill: 0  2. Polyphagia Controlled. Current treatment: phentermine 37.5 mg daily.    Plan:  Continue phentermine 37.5 mg daily.  Will refill today. She will continue to focus on protein-rich, low simple carbohydrate foods. We reviewed the importance of hydration, regular exercise for stress reduction, and restorative sleep.  - Refill phentermine (ADIPEX-P) 37.5 MG tablet; Take 1 tablet (37.5 mg total) by mouth daily.  Dispense: 30 tablet; Refill: 0  Having again reminded the patient of the "off label" use of Phentermine beyond three consecutive  months, and again discussing the risks, benefits, contraindications, and limitations of it's use; given it's role in the successful treatment of obesity thus far and lack of adverse effect, patient has expressed desire and given informed verbal consent to continue use.   I have consulted the [place] Controlled Substances Registry for this patient, and feel the risk/benefit ratio today is favorable for proceeding with this prescription for a controlled substance. The patient understands monitoring parameters and red flags.   3. Obesity, current  BMI 42.8  Course: [name] is currently in the action stage of change. As such, her goal is to continue with weight loss efforts.   Nutrition goals: She has agreed to the Category 3 Plan.   Exercise goals:  Increase NEAT.  Behavioral modification strategies: increasing lean protein intake, decreasing simple carbohydrates, increasing vegetables, and increasing water intake.  [name] has agreed to follow-up with our clinic in 3-4 weeks. She was informed of the importance of frequent follow-up visits to maximize her success with intensive lifestyle modifications for her multiple health conditions.   Objective:   Blood pressure 122/82, pulse 94, temperature 98.2 F (36.8 C), temperature source Oral, height 5\' 5"  (1.651 m), weight 257 lb (116.6 kg), SpO2 96 %. Body mass index is 42.77 kg/m.  General: Cooperative, alert, well developed, in no acute distress. HEENT: Conjunctivae and lids unremarkable. Cardiovascular: Regular rhythm.  Lungs: Normal work of breathing. Neurologic: No focal deficits.   Lab Results  Component Value Date   CREATININE 0.96 06/11/2020   BUN 13 06/11/2020   NA 140 06/11/2020   K 4.6 06/11/2020   CL 101 06/11/2020   CO2 23 06/11/2020   Lab Results  Component Value Date   ALT 15 06/11/2020   AST 9 06/11/2020   ALKPHOS 66 06/11/2020   BILITOT 0.2 06/11/2020   Lab Results  Component Value Date   HGBA1C 5.4 06/11/2020   HGBA1C 5.6 10/09/2019   HGBA1C 5.3 02/02/2019   HGBA1C 5.5 11/01/2018   HGBA1C 5.5 07/06/2017   Lab Results  Component Value Date   INSULIN 17.5 06/11/2020   INSULIN 25.3 (H) 02/02/2019   INSULIN 23.9 11/01/2018   Lab Results  Component Value Date   TSH 1.820 02/02/2019   Lab Results  Component Value Date   CHOL 115 10/09/2019   HDL 51.60 10/09/2019   LDLCALC 36 10/09/2019   TRIG 140.0 10/09/2019   CHOLHDL 2 10/09/2019   Lab Results  Component Value Date   VD25OH 49.0 02/02/2019   VD25OH 78.6 11/01/2018   VD25OH  51.90 07/19/2017   Lab Results  Component Value Date   WBC 9.2 10/09/2019   HGB 13.9 02/28/2020   HCT 40.1 10/09/2019   MCV 93.8 10/09/2019   PLT 370.0 10/09/2019   Attestation Statements:   Reviewed by clinician on day of visit: allergies, medications, problem list, medical history, surgical history, family history, social history, and previous encounter notes.  I, 10/11/2019, CMA, am acting as transcriptionist for Insurance claims handler, DO  I have reviewed the above documentation for accuracy and completeness, and I agree with the above. -  Helane Rima, DO, MS, FAAFP, DABOM - Family and Bariatric Medicine.

## 2020-11-20 ENCOUNTER — Ambulatory Visit
Admission: RE | Admit: 2020-11-20 | Discharge: 2020-11-20 | Disposition: A | Discharge status: Home / Self Care | Payer: BC Managed Care – PPO | Source: Ambulatory Visit | Attending: Emergency Medicine | Admitting: Emergency Medicine

## 2020-11-20 ENCOUNTER — Other Ambulatory Visit: Payer: Self-pay

## 2020-11-20 VITALS — BP 119/77 | HR 107 | Temp 97.9°F | Resp 18

## 2020-11-20 DIAGNOSIS — N390 Urinary tract infection, site not specified: Secondary | ICD-10-CM | POA: Insufficient documentation

## 2020-11-20 DIAGNOSIS — R300 Dysuria: Secondary | ICD-10-CM

## 2020-11-20 DIAGNOSIS — R319 Hematuria, unspecified: Secondary | ICD-10-CM | POA: Diagnosis not present

## 2020-11-20 LAB — POCT URINALYSIS DIP (MANUAL ENTRY)
Bilirubin, UA: NEGATIVE
Glucose, UA: NEGATIVE mg/dL
Nitrite, UA: POSITIVE — AB
Protein Ur, POC: 30 mg/dL — AB
Spec Grav, UA: 1.02 (ref 1.010–1.025)
Urobilinogen, UA: 0.2 E.U./dL
pH, UA: 7 (ref 5.0–8.0)

## 2020-11-20 LAB — POCT URINE PREGNANCY: Preg Test, Ur: NEGATIVE

## 2020-11-20 MED ORDER — CEPHALEXIN 500 MG PO CAPS: 500.0000 mg | ORAL_CAPSULE | Freq: Two times a day (BID) | ORAL | 0 refills | Status: AC

## 2020-11-20 NOTE — Discharge Instructions (Addendum)
Take the antibiotic as directed.  The urine culture is pending.  We will call you if it shows the need to change or discontinue your antibiotic.    Follow up with your primary care provider if your symptoms are not improving.    

## 2020-11-20 NOTE — ED Provider Notes (Signed)
Renaldo Fiddler    CSN: [id number] Arrival date & time: 11/20/20  1335      History   Chief Complaint Chief Complaint  Patient presents with   Dysuria   Abdominal Pain    HPI [name] is a 35 y.o. female.  Patient presents with 4-day history of dysuria, lower abdominal discomfort, right lower back pain.  She denies fever, chills, hematuria, vomiting, diarrhea, vaginal discharge, pelvic pain, or other symptoms.  No treatment at home.  Her medical history includes pseudotumor cerebri, chronic migraine headaches, cardiomegaly, IBS, GERD, PCOS.  The history is provided by the patient and medical records.   Past Medical History:  Diagnosis Date   Allergy    Anxiety    Back pain    Constipation    Depression    Dyspnea    Gallbladder problem    GERD (gastroesophageal reflux disease)    IBS (irritable bowel syndrome)    Interstitial cystitis    Lactose intolerance    Migraine    Overactive bladder    Palpitations    PCOS (polycystic ovarian syndrome)    Pelvic pain    Pseudotumor cerebri    Pseudotumor cerebri    SUI (stress urinary incontinence, female)    Urinary incontinence     Patient Active Problem List   Diagnosis Date Noted   Pre-diabetes 06/12/2020   Vitamin D deficiency 06/12/2020   Insulin resistance 04/18/2019   Class 3 severe obesity with serious comorbidity and body mass index (BMI) of 45.0 to 49.9 in adult [facility]) 04/18/2019   Elevated TSH 07/19/2017   Fatigue 07/19/2017   Visit for preventive health examination 05/05/2016   Depression 01/18/2016   Insomnia 01/18/2016   Chronic migraine 01/18/2016   CARDIOMEGALY 06/13/2009    Past Surgical History:  Procedure Laterality Date   CESAREAN SECTION  2009  &  2011   2011 W/ BILATERAL TUBAL LIGATION   CHOLECYSTECTOMY  2010   CYSTO WITH HYDRODISTENSION  03/01/2012   Procedure: CYSTOSCOPY/HYDRODISTENSION;  Surgeon: Martina Sinner, MD;  Location: [facility] [place];  Service:  Urology;  Laterality: N/A;  INSTILLATION of marcaine and pyridium.   PUBOVAGINAL SLING N/A 02/14/2013   Procedure: PUBO-VAGINAL SLING AND HYDRODISTENSION;  Surgeon: Martina Sinner, MD;  Location: [facility];  Service: Urology;  Laterality: N/A;   TRANSTHORACIC ECHOCARDIOGRAM  06/2009   TUBAL LIGATION      OB History     Gravida  3   Para      Term      Preterm      AB  1   Living  2      SAB  1   IAB      Ectopic      Multiple      Live Births               Home Medications    Prior to Admission medications   Medication Sig Start Date End Date Taking? Authorizing Provider  cephALEXin (KEFLEX) 500 MG capsule Take 1 capsule (500 mg total) by mouth 2 (two) times daily for 5 days. 11/20/20 11/25/20 Yes Mickie Bail, NP  amitriptyline (ELAVIL) 25 MG tablet TAKE 2 TABLETS(50 MG) BY MOUTH AT BEDTIME 10/14/20   Allwardt, Crist Infante, PA-C  Biotin 5000 MCG CAPS Take 1 capsule by mouth daily.    [provider]  buPROPion (WELLBUTRIN SR) 200 MG 12 hr tablet Take 1 tablet (200 mg total) by  mouth 2 (two) times daily. 10/24/20   Whitmire, Thermon Leyland, FNP  cetirizine (ZYRTEC) 10 MG tablet Take 10 mg by mouth daily.    [provider]  Cholecalciferol (VITAMIN D) 125 MCG (5000 UT) CAPS Take 1 capsule by mouth daily. 06/11/20   Whitmire, Thermon Leyland, FNP  fluticasone (FLONASE) 50 MCG/ACT nasal spray Place 2 sprays into both nostrils daily. 04/20/18   Jannifer Rodney A, FNP  hydrOXYzine (ATARAX/VISTARIL) 25 MG tablet TAKE 1 TABLET(25 MG) BY MOUTH AT BEDTIME 09/05/20   Allwardt, Crist Infante, PA-C  Melatonin 10 MG TABS Take 10 mg by mouth at bedtime.     [provider]  metFORMIN (GLUCOPHAGE) 500 MG tablet Take 1 tablet (500 mg total) by mouth 2 (two) times daily with a meal. 10/01/20   Whitmire, Thermon Leyland, FNP  Multiple Vitamin (MULTIVITAMIN) tablet Take 1 tablet by mouth daily.    [provider]  phentermine (ADIPEX-P) 37.5 MG tablet Take 1 tablet  (37.5 mg total) by mouth daily. 11/12/20   Helane Rima, DO  spironolactone (ALDACTONE) 100 MG tablet Take 100 mg by mouth daily. 12/03/15   [provider]  SPRINTEC 28 0.25-35 MG-MCG tablet Take 1 tablet by mouth daily. 12/01/15   [provider]  tirzepatide Greggory Keen) 7.5 MG/0.5ML Pen Inject 7.5 mg into the skin once a week. 11/12/20   Helane Rima, DO  venlafaxine XR (EFFEXOR-XR) 150 MG 24 hr capsule TAKE 1 CAPSULE(150 MG) BY MOUTH DAILY WITH BREAKFAST 07/10/20   Shelva Majestic, MD    Family History Family History  Problem Relation Age of Onset   Hypertension Mother    Depression Mother    Diabetes Mother    Anxiety disorder Mother    Sleep apnea Mother    Obesity Mother    Hypertension Father    Cancer Father        kidney   Depression Father    Hyperlipidemia Father    Kidney cancer Father    Alcoholism Father    Obesity Father    Cancer Maternal Uncle        stomach   Hypertension Maternal Grandmother    Cancer Maternal Grandmother        kidney, bone   Hypertension Maternal Grandfather    Cancer Maternal Grandfather        lung, bone   Stroke Maternal Grandfather    Hypertension Paternal Grandmother    Hypertension Paternal Grandfather    Stroke Paternal Grandfather     Social History Social History   Tobacco Use   Smoking status: Never   Smokeless tobacco: Never  Vaping Use   Vaping Use: Never used  Substance Use Topics   Alcohol use: No   Drug use: No     Allergies   Patient has no known allergies.   Review of Systems Review of Systems  Constitutional:  Negative for chills and fever.  Respiratory:  Negative for cough and shortness of breath.   Cardiovascular:  Negative for chest pain and palpitations.  Gastrointestinal:  Positive for abdominal pain. Negative for diarrhea, nausea and vomiting.  Genitourinary:  Positive for dysuria. Negative for hematuria, pelvic pain and vaginal discharge.  Musculoskeletal:  Positive for  back pain. Negative for arthralgias.  Skin:  Negative for color change and rash.  All other systems reviewed and are negative.   Physical Exam Triage Vital Signs ED Triage Vitals  Enc Vitals Group     BP      Pulse  Resp      Temp      Temp src      SpO2      Weight      Height      Head Circumference      Peak Flow      Pain Score      Pain Loc      Pain Edu?      Excl. in GC?    No data found.  Updated Vital Signs BP 119/77 (BP Location: Left Arm)   Pulse (!) 107   Temp 97.9 F (36.6 C) (Oral)   Resp 18   LMP 11/05/2020 (Approximate)   SpO2 96%   Visual Acuity Right Eye Distance:   Left Eye Distance:   Bilateral Distance:    Right Eye Near:   Left Eye Near:    Bilateral Near:     Physical Exam Vitals and nursing note reviewed.  Constitutional:      General: She is not in acute distress.    Appearance: She is well-developed. She is not ill-appearing.  HENT:     Head: Normocephalic and atraumatic.     Mouth/Throat:     Mouth: Mucous membranes are moist.  Eyes:     Conjunctiva/sclera: Conjunctivae normal.  Cardiovascular:     Rate and Rhythm: Normal rate and regular rhythm.     Heart sounds: Normal heart sounds.  Pulmonary:     Effort: Pulmonary effort is normal. No respiratory distress.     Breath sounds: Normal breath sounds.  Abdominal:     Palpations: Abdomen is soft.     Tenderness: There is no abdominal tenderness. There is no right CVA tenderness, left CVA tenderness, guarding or rebound.  Musculoskeletal:     Cervical back: Neck supple.  Skin:    General: Skin is warm and dry.  Neurological:     General: No focal deficit present.     Mental Status: She is alert and oriented to person, place, and time.     Gait: Gait normal.  Psychiatric:        Mood and Affect: Mood normal.        Behavior: Behavior normal.     UC Treatments / Results  Labs (all labs ordered are listed, but only abnormal results are displayed) Labs Reviewed   POCT URINALYSIS DIP (MANUAL ENTRY) - Abnormal; Notable for the following components:      Result Value   Clarity, UA cloudy (*)    Ketones, POC UA trace (5) (*)    Blood, UA moderate (*)    Protein Ur, POC =30 (*)    Nitrite, UA Positive (*)    Leukocytes, UA Small (1+) (*)    All other components within normal limits  URINE CULTURE  POCT URINE PREGNANCY    EKG   Radiology No results found.  Procedures Procedures (including critical care time)  Medications Ordered in UC Medications - No data to display  Initial Impression / Assessment and Plan / UC Course  I have reviewed the triage vital signs and the nursing notes.  Pertinent labs & imaging results that were available during my care of the patient were reviewed by me and considered in my medical decision making (see chart for details).  Urinary tract infection with hematuria.  Treating with Keflex. Urine culture pending. Discussed with patient that we will call her if the urine culture shows the need to change or discontinue the antibiotic. Instructed her to follow-up with her  PCP if her symptoms are not improving. Patient agrees to plan of care.      Final Clinical Impressions(s) / UC Diagnoses   Final diagnoses:  Urinary tract infection with hematuria, site unspecified     Discharge Instructions      Take the antibiotic as directed.  The urine culture is pending.  We will call you if it shows the need to change or discontinue your antibiotic.    Follow up with your primary care provider if your symptoms are not improving.         ED Prescriptions     Medication Sig Dispense Auth. Provider   cephALEXin (KEFLEX) 500 MG capsule Take 1 capsule (500 mg total) by mouth 2 (two) times daily for 5 days. 10 capsule Mickie Bail, NP      PDMP not reviewed this encounter.   Mickie Bail, NP 11/20/20 1409

## 2020-11-20 NOTE — ED Triage Notes (Signed)
Pt here with painful urination, right back pain and lower abdominal pain x 4 days.

## 2020-11-22 LAB — URINE CULTURE: Culture: >=100000 — AB

## 2020-11-28 ENCOUNTER — Other Ambulatory Visit: Payer: Self-pay | Admitting: Physician Assistant

## 2020-11-29 ENCOUNTER — Encounter: Payer: Self-pay | Admitting: Physician Assistant

## 2020-12-02 ENCOUNTER — Ambulatory Visit (INDEPENDENT_AMBULATORY_CARE_PROVIDER_SITE_OTHER): Payer: BC Managed Care – PPO | Admitting: Family Medicine

## 2020-12-02 ENCOUNTER — Encounter (INDEPENDENT_AMBULATORY_CARE_PROVIDER_SITE_OTHER): Payer: Self-pay

## 2020-12-30 ENCOUNTER — Ambulatory Visit (INDEPENDENT_AMBULATORY_CARE_PROVIDER_SITE_OTHER): Payer: BC Managed Care – PPO | Admitting: Physician Assistant

## 2020-12-30 ENCOUNTER — Other Ambulatory Visit: Payer: Self-pay

## 2020-12-30 DIAGNOSIS — G4700 Insomnia, unspecified: Secondary | ICD-10-CM | POA: Diagnosis not present

## 2020-12-30 DIAGNOSIS — F32A Depression, unspecified: Secondary | ICD-10-CM

## 2020-12-30 DIAGNOSIS — F419 Anxiety disorder, unspecified: Secondary | ICD-10-CM

## 2020-12-30 MED ORDER — VENLAFAXINE HCL ER 150 MG PO CP24: ORAL_CAPSULE | ORAL | 1 refills | Status: DC

## 2020-12-30 MED ORDER — HYDROXYZINE HCL 25 MG PO TABS: ORAL_TABLET | ORAL | 5 refills | Status: DC

## 2020-12-30 MED ORDER — AMITRIPTYLINE HCL 25 MG PO TABS: ORAL_TABLET | ORAL | 1 refills | Status: DC

## 2020-12-30 NOTE — Progress Notes (Signed)
Subjective:    Patient ID: [name], female    DOB: Apr 20, 1985, 35 y.o.   MRN: [id number]  Chief Complaint  Patient presents with   Follow-up    HPI Patient is in today for follow-up.  Needing refills on her medications.  No major medical changes since last visit.  She has been doing well with amitriptyline and Effexor. She is taking two hydroxyzine 25 mg tablets at night occasionally for sleep. Depends on the night per patient.   Since our last visit she is seeing healthy weight and wellness and has been meeting with Dr. Earlene Plater.  She says that she has lost 30 pounds this year.  She says that she is doing well with Wellbutrin, Adipex, Montanaro combination to help with her weight loss.  Sees GYN - UTD on pap smear, negative. Aldactone and OCP managed there.   Past Medical History:  Diagnosis Date   Allergy    Anxiety    Back pain    Constipation    Depression    Dyspnea    Gallbladder problem    GERD (gastroesophageal reflux disease)    IBS (irritable bowel syndrome)    Interstitial cystitis    Lactose intolerance    Migraine    Overactive bladder    Palpitations    PCOS (polycystic ovarian syndrome)    Pelvic pain    Pseudotumor cerebri    Pseudotumor cerebri    SUI (stress urinary incontinence, female)    Urinary incontinence     Past Surgical History:  Procedure Laterality Date   CESAREAN SECTION  2009  &  2011   2011 W/ BILATERAL TUBAL LIGATION   CHOLECYSTECTOMY  2010   CYSTO WITH HYDRODISTENSION  03/01/2012   Procedure: CYSTOSCOPY/HYDRODISTENSION;  Surgeon: Martina Sinner, MD;  Location: [facility];  Service: Urology;  Laterality: N/A;  INSTILLATION of marcaine and pyridium.   PUBOVAGINAL SLING N/A 02/14/2013   Procedure: PUBO-VAGINAL SLING AND HYDRODISTENSION;  Surgeon: Martina Sinner, MD;  Location: [facility];  Service: Urology;  Laterality: N/A;   TRANSTHORACIC ECHOCARDIOGRAM  06/2009   TUBAL LIGATION       Family History  Problem Relation Age of Onset   Hypertension Mother    Depression Mother    Diabetes Mother    Anxiety disorder Mother    Sleep apnea Mother    Obesity Mother    Hypertension Father    Cancer Father        kidney   Depression Father    Hyperlipidemia Father    Kidney cancer Father    Alcoholism Father    Obesity Father    Cancer Maternal Uncle        stomach   Hypertension Maternal Grandmother    Cancer Maternal Grandmother        kidney, bone   Hypertension Maternal Grandfather    Cancer Maternal Grandfather        lung, bone   Stroke Maternal Grandfather    Hypertension Paternal Grandmother    Hypertension Paternal Grandfather    Stroke Paternal Grandfather     Social History   Tobacco Use   Smoking status: Never   Smokeless tobacco: Never  Vaping Use   Vaping Use: Never used  Substance Use Topics   Alcohol use: No   Drug use: No     No Known Allergies  Review of Systems NEGATIVE UNLESS OTHERWISE INDICATED IN HPI      Objective:  BP 112/79   Pulse 78   Temp 98 F (36.7 C)   Ht 5\' 5"  (1.651 m)   Wt 260 lb (117.9 kg)   SpO2 98%   BMI 43.27 kg/m   Wt Readings from Last 3 Encounters:  12/30/20 260 lb (117.9 kg)  11/12/20 257 lb (116.6 kg)  10/24/20 261 lb (118.4 kg)    BP Readings from Last 3 Encounters:  12/30/20 112/79  11/20/20 119/77  11/12/20 122/82     Physical Exam Vitals and nursing note reviewed.  Constitutional:      Appearance: Normal appearance. She is obese. She is not toxic-appearing.  HENT:     Head: Normocephalic and atraumatic.     Right Ear: Tympanic membrane, ear canal and external ear normal.     Left Ear: Tympanic membrane, ear canal and external ear normal.     Nose: Nose normal.     Mouth/Throat:     Mouth: Mucous membranes are moist.  Eyes:     Extraocular Movements: Extraocular movements intact.     Conjunctiva/sclera: Conjunctivae normal.     Pupils: Pupils are equal, round, and  reactive to light.  Cardiovascular:     Rate and Rhythm: Normal rate and regular rhythm.     Pulses: Normal pulses.     Heart sounds: Normal heart sounds.  Pulmonary:     Effort: Pulmonary effort is normal.     Breath sounds: Normal breath sounds.  Abdominal:     General: Abdomen is flat. Bowel sounds are normal.     Palpations: Abdomen is soft.  Musculoskeletal:        General: Normal range of motion.     Cervical back: Normal range of motion and neck supple.  Skin:    General: Skin is warm and dry.  Neurological:     General: No focal deficit present.     Mental Status: She is alert and oriented to person, place, and time.  Psychiatric:        Mood and Affect: Mood normal.        Behavior: Behavior normal.        Thought Content: Thought content normal.        Judgment: Judgment normal.       Assessment & Plan:   Problem List Items Addressed This Visit       Other   Insomnia   Relevant Medications   amitriptyline (ELAVIL) 25 MG tablet   Other Visit Diagnoses     Anxiety and depression       Relevant Medications   amitriptyline (ELAVIL) 25 MG tablet   hydrOXYzine (ATARAX) 25 MG tablet   venlafaxine XR (EFFEXOR-XR) 150 MG 24 hr capsule        Meds ordered this encounter  Medications   amitriptyline (ELAVIL) 25 MG tablet    Sig: TAKE 2 TABLETS(50 MG) BY MOUTH AT BEDTIME    Dispense:  180 tablet    Refill:  1   hydrOXYzine (ATARAX) 25 MG tablet    Sig: TAKE 2 TABLETS BY MOUTH AT BEDTIME    Dispense:  60 tablet    Refill:  5   venlafaxine XR (EFFEXOR-XR) 150 MG 24 hr capsule    Sig: TAKE 1 CAPSULE(150 MG) BY MOUTH DAILY WITH BREAKFAST    Dispense:  90 capsule    Refill:  1    1. Insomnia, unspecified type I continue to encourage good sleep hygiene.  I have refilled hydroxyzine 25 mg for her to  take 1 to 2 tablets at bedtime as needed.  Increasing physical activity and healthy nutrition during the daytime will also help to provide better quality sleep for  her.  2. Anxiety and depression She is doing well with Effexor and amitriptyline.  Refill sent for her.  No side effects or concerns.  Plan to follow-up in about 6 months.   This note was prepared with assistance of Conservation officer, historic buildings. Occasional wrong-word or sound-a-like substitutions may have occurred due to the inherent limitations of voice recognition software.  Time Spent: 21 minutes of total time was spent on the date of the encounter performing the following actions: chart review prior to seeing the patient, obtaining history, performing a medically necessary exam, counseling on the treatment plan, placing orders, and documenting in our EHR.    Naysha Sholl M Petronella Shuford, PA-C

## 2021-01-02 ENCOUNTER — Ambulatory Visit (INDEPENDENT_AMBULATORY_CARE_PROVIDER_SITE_OTHER): Payer: BC Managed Care – PPO | Admitting: Family Medicine

## 2021-01-02 ENCOUNTER — Other Ambulatory Visit: Payer: Self-pay

## 2021-01-02 ENCOUNTER — Encounter (INDEPENDENT_AMBULATORY_CARE_PROVIDER_SITE_OTHER): Payer: Self-pay | Admitting: Family Medicine

## 2021-01-02 VITALS — BP 115/77 | HR 95 | Temp 98.6°F | Ht 65.0 in | Wt 257.0 lb

## 2021-01-02 DIAGNOSIS — Z9189 Other specified personal risk factors, not elsewhere classified: Secondary | ICD-10-CM

## 2021-01-02 DIAGNOSIS — F3289 Other specified depressive episodes: Secondary | ICD-10-CM | POA: Diagnosis not present

## 2021-01-02 DIAGNOSIS — R7303 Prediabetes: Secondary | ICD-10-CM | POA: Diagnosis not present

## 2021-01-02 DIAGNOSIS — Z6841 Body Mass Index (BMI) 40.0 and over, adult: Secondary | ICD-10-CM | POA: Diagnosis not present

## 2021-01-02 MED ORDER — BUPROPION HCL ER (SR) 200 MG PO TB12: 200.0000 mg | ORAL_TABLET | Freq: Two times a day (BID) | ORAL | 0 refills | Status: DC

## 2021-01-02 MED ORDER — TIRZEPATIDE 5 MG/0.5ML ~~LOC~~ SOAJ
5.0000 mg | SUBCUTANEOUS | 0 refills | Status: DC
Start: 2021-01-02 — End: 2021-02-19

## 2021-01-02 NOTE — Progress Notes (Signed)
Chief Complaint:   OBESITY [name] is here to discuss her progress with her obesity treatment plan along with follow-up of her obesity related diagnoses. [name] is on the Category 3 Plan and states she is following her eating plan approximately 50-75% of the time. [name] states she is not currently exercising.  Today's visit was #: 31 Starting weight: 282 lbs Starting date: 11/01/2018 Today's weight: 257 lbs Today's date: 01/02/2021 Total lbs lost to date: 25  Total lbs lost since last in-office visit: 0  Interim History: [name] is happy she didn't gain over Thanksgiving. She is hitting her calorie and protein goals 75% of the time. Pt denies cravings or issues with the plan.  Subjective:   1. Pre-diabetes [name] has been out of current dose of Mounjaro for 2 weeks, so she has not take it. Due to history of 2-3 days of GI upset after 7.5 mg dose, pt wishes to restart at 5 mg.  2. Other depression, with emotional eating [name] is struggling with emotional eating and using food for comfort to the extent that it is negatively impacting her health. She has been working on behavior modification techniques to help reduce her emotional eating. She shows no sign of suicidal or homicidal ideations.  3. At risk for side effect of medication [name] is at risk for side effects of medication due to being off meds for 2 weeks.  Assessment/Plan:  No orders of the defined types were placed in this encounter.   Medications Discontinued During This Encounter  Medication Reason   tirzepatide (MOUNJARO) 7.5 MG/0.5ML Pen    buPROPion (WELLBUTRIN SR) 200 MG 12 hr tablet Reorder     Meds ordered this encounter  Medications   buPROPion (WELLBUTRIN SR) 200 MG 12 hr tablet    Sig: Take 1 tablet (200 mg total) by mouth 2 (two) times daily.    Dispense:  180 tablet    Refill:  0   tirzepatide (MOUNJARO) 5 MG/0.5ML Pen    Sig: Inject 5 mg into the skin once a week.    Dispense:  2  mL    Refill:  0     1. Pre-diabetes [name] will restart Mounjaro at 5 mg dose and continue to work on weight loss, exercise, and decreasing simple carbohydrates to help decrease the risk of diabetes. Risks and benefits discussed with pt.  Restart- tirzepatide [facility]) 5 MG/0.5ML Pen; Inject 5 mg into the skin once a week.  Dispense: 2 mL; Refill: 0  2. Other depression, with emotional eating Pt's mood is stable and she is doing well. She denies emotional eating. Behavior modification techniques were discussed today to help [name] deal with her emotional/non-hunger eating behaviors.  Orders and follow up as documented in patient record.   Refill- buPROPion (WELLBUTRIN SR) 200 MG 12 hr tablet; Take 1 tablet (200 mg total) by mouth 2 (two) times daily.  Dispense: 180 tablet; Refill: 0  3. At risk for side effect of medication [name] was given approximately 15 minutes of drug side effect counseling today.  We discussed side effect possibility and risk versus benefits. [name] agreed to the medication and will contact this office if these side effects are intolerable.  Repetitive spaced learning was employed today to elicit superior memory formation and behavioral change.  4. Obesity with current BMI of 42.8  [name] is currently in the action stage of change. As such, her goal is to continue with weight loss efforts. She has agreed to the Category  3 Plan.   Exercise goals:  As is  Behavioral modification strategies: planning for success.  [name] has agreed to follow-up with our clinic in 2-4 weeks. She was informed of the importance of frequent follow-up visits to maximize her success with intensive lifestyle modifications for her multiple health conditions.   Objective:   Blood pressure 115/77, pulse 95, temperature 98.6 F (37 C), height 5\' 5"  (1.651 m), weight 257 lb (116.6 kg), SpO2 97 %. Body mass index is 42.77 kg/m.  General: Cooperative, alert, well developed, in  no acute distress. HEENT: Conjunctivae and lids unremarkable. Cardiovascular: Regular rhythm.  Lungs: Normal work of breathing. Neurologic: No focal deficits.   Lab Results  Component Value Date   CREATININE 0.96 06/11/2020   BUN 13 06/11/2020   NA 140 06/11/2020   K 4.6 06/11/2020   CL 101 06/11/2020   CO2 23 06/11/2020   Lab Results  Component Value Date   ALT 15 06/11/2020   AST 9 06/11/2020   ALKPHOS 66 06/11/2020   BILITOT 0.2 06/11/2020   Lab Results  Component Value Date   HGBA1C 5.4 06/11/2020   HGBA1C 5.6 10/09/2019   HGBA1C 5.3 02/02/2019   HGBA1C 5.5 11/01/2018   HGBA1C 5.5 07/06/2017   Lab Results  Component Value Date   INSULIN 17.5 06/11/2020   INSULIN 25.3 (H) 02/02/2019   INSULIN 23.9 11/01/2018   Lab Results  Component Value Date   TSH 1.820 02/02/2019   Lab Results  Component Value Date   CHOL 115 10/09/2019   HDL 51.60 10/09/2019   LDLCALC 36 10/09/2019   TRIG 140.0 10/09/2019   CHOLHDL 2 10/09/2019   Lab Results  Component Value Date   VD25OH 49.0 02/02/2019   VD25OH 78.6 11/01/2018   VD25OH 51.90 07/19/2017   Lab Results  Component Value Date   WBC 9.2 10/09/2019   HGB 13.9 02/28/2020   HCT 40.1 10/09/2019   MCV 93.8 10/09/2019   PLT 370.0 10/09/2019    Attestation Statements:   Reviewed by clinician on day of visit: allergies, medications, problem list, medical history, surgical history, family history, social history, and previous encounter notes.  10/11/2019, CMA, am acting as transcriptionist for Edmund Hilda, DO.  I have reviewed the above documentation for accuracy and completeness, and I agree with the above. [employer], D.O.  The 21st Century Cures Act was signed into law in 2016 which includes the topic of electronic health records.  This provides immediate access to information in MyChart.  This includes consultation notes, operative notes, office notes, lab results and pathology reports.  If  you have any questions about what you read please let 2017 know at your next visit so we can discuss your concerns and take corrective action if need be.  We are right here with you.

## 2021-01-05 ENCOUNTER — Ambulatory Visit: Payer: Self-pay

## 2021-01-23 ENCOUNTER — Ambulatory Visit (INDEPENDENT_AMBULATORY_CARE_PROVIDER_SITE_OTHER): Payer: BC Managed Care – PPO | Admitting: Family Medicine

## 2021-01-23 ENCOUNTER — Encounter (INDEPENDENT_AMBULATORY_CARE_PROVIDER_SITE_OTHER): Payer: Self-pay

## 2021-01-24 ENCOUNTER — Ambulatory Visit: Payer: Self-pay

## 2021-01-28 ENCOUNTER — Ambulatory Visit (INDEPENDENT_AMBULATORY_CARE_PROVIDER_SITE_OTHER): Payer: BC Managed Care – PPO | Admitting: Family Medicine

## 2021-01-28 ENCOUNTER — Encounter (INDEPENDENT_AMBULATORY_CARE_PROVIDER_SITE_OTHER): Payer: Self-pay | Admitting: Family Medicine

## 2021-01-28 ENCOUNTER — Other Ambulatory Visit: Payer: Self-pay

## 2021-01-28 VITALS — BP 111/76 | HR 99 | Temp 98.0°F | Ht 65.0 in | Wt 257.0 lb

## 2021-01-28 DIAGNOSIS — Z9189 Other specified personal risk factors, not elsewhere classified: Secondary | ICD-10-CM | POA: Diagnosis not present

## 2021-01-28 DIAGNOSIS — E559 Vitamin D deficiency, unspecified: Secondary | ICD-10-CM | POA: Diagnosis not present

## 2021-01-28 DIAGNOSIS — R7303 Prediabetes: Secondary | ICD-10-CM | POA: Diagnosis not present

## 2021-01-28 DIAGNOSIS — Z6841 Body Mass Index (BMI) 40.0 and over, adult: Secondary | ICD-10-CM

## 2021-01-28 MED ORDER — PHENTERMINE HCL 37.5 MG PO TABS: 37.5000 mg | ORAL_TABLET | Freq: Every day | ORAL | 0 refills | Status: DC

## 2021-01-29 NOTE — Progress Notes (Signed)
Chief Complaint:   OBESITY [name] is here to discuss her progress with her obesity treatment plan along with follow-up of her obesity related diagnoses. [name] is on the Category 3 Plan and states she is following her eating plan approximately 75% of the time. [name] states she is doing 0 minutes 0 times per week.  Today's visit was #: 7 Starting weight: 282 lbs Starting date: 11/01/2018 Today's weight: 257 lbs Today's date: 01/28/2021 Total lbs lost to date: 25 Total lbs lost since last in-office visit: 0  Interim History: [name] has done well with maintaining her weight. She is on phentermine and she notes no palpitations/tremors, or insomnia. She requests a refill today. Her appetite is suppressed enough that she may not be meeting her protein goals.  Subjective:   1. Pre-diabetes [name] is on Mounjaro 5 mg and she is due to start at 7.5 mg. She notes decreased polyphagia and mild GI symptoms.  2. Vitamin D deficiency [name] is stable on Vit D OTC, ans she is due for labs soon. No side effects were noted.  3. At risk for impaired metabolic function [name] is at increased risk for impaired metabolic function due to current nutrition and muscle mass.  Assessment/Plan:   1. Pre-diabetes [name] will continue Mounjaro at 7.5 mg, and will we will follow and recheck labs at her next visit. She will continue with diet and exercise to help decrease the risk of diabetes.   2. Vitamin D deficiency [name] will continue OTC Vitamin D and we will recheck labs at her next visit. She will follow-up for routine testing of Vitamin D, at least 2-3 times per year to avoid over-replacement.  3. At risk for impaired metabolic function [name] was given approximately 15 minutes of impaired  metabolic function prevention counseling today. We discussed intensive lifestyle modifications today with an emphasis on specific nutrition and exercise instructions and strategies.    Repetitive spaced learning was employed today to elicit superior memory formation and behavioral change.  4. Obesity BMI today is 72 [name] is currently in the action stage of change. As such, her goal is to continue with weight loss efforts. She has agreed to the Category 3 Plan.   We discussed various medication options to help [name] with her weight loss efforts and we both agreed to continue phentermine 37.5 mg and we will refill for 1 month.  - phentermine (ADIPEX-P) 37.5 MG tablet; Take 1 tablet (37.5 mg total) by mouth daily.  Dispense: 30 tablet; Refill: 0  We will recheck fasting labs at her next office visit.  Behavioral modification strategies: meal planning and cooking strategies.  [name] has agreed to follow-up with our clinic in 3 to 4 weeks with Dr. Juleen China. She was informed of the importance of frequent follow-up visits to maximize her success with intensive lifestyle modifications for her multiple health conditions.   Objective:   Blood pressure 111/76, pulse 99, temperature 98 F (36.7 C), temperature source Oral, height 5\' 5"  (1.651 m), weight 257 lb (116.6 kg), SpO2 97 %. Body mass index is 42.77 kg/m.  General: Cooperative, alert, well developed, in no acute distress. HEENT: Conjunctivae and lids unremarkable. Cardiovascular: Regular rhythm.  Lungs: Normal work of breathing. Neurologic: No focal deficits.   Lab Results  Component Value Date   CREATININE 0.96 06/11/2020   BUN 13 06/11/2020   NA 140 06/11/2020   K 4.6 06/11/2020   CL 101 06/11/2020   CO2 23 06/11/2020   Lab Results  Component Value Date   ALT 15 06/11/2020   AST 9 06/11/2020   ALKPHOS 66 06/11/2020   BILITOT 0.2 06/11/2020   Lab Results  Component Value Date   HGBA1C 5.4 06/11/2020   HGBA1C 5.6 10/09/2019   HGBA1C 5.3 02/02/2019   HGBA1C 5.5 11/01/2018   HGBA1C 5.5 07/06/2017   Lab Results  Component Value Date   INSULIN 17.5 06/11/2020   INSULIN 25.3 (H) 02/02/2019    INSULIN 23.9 11/01/2018   Lab Results  Component Value Date   TSH 1.820 02/02/2019   Lab Results  Component Value Date   CHOL 115 10/09/2019   HDL 51.60 10/09/2019   LDLCALC 36 10/09/2019   TRIG 140.0 10/09/2019   CHOLHDL 2 10/09/2019   Lab Results  Component Value Date   VD25OH 49.0 02/02/2019   VD25OH 78.6 11/01/2018   VD25OH 51.90 07/19/2017   Lab Results  Component Value Date   WBC 9.2 10/09/2019   HGB 13.9 02/28/2020   HCT 40.1 10/09/2019   MCV 93.8 10/09/2019   PLT 370.0 10/09/2019   No results found for: IRON, TIBC, FERRITIN  Attestation Statements:   Reviewed by clinician on day of visit: allergies, medications, problem list, medical history, surgical history, family history, social history, and previous encounter notes.   I, Trixie Dredge, am acting as transcriptionist for Dennard Nip, MD.  I have reviewed the above documentation for accuracy and completeness, and I agree with the above. -  Dennard Nip, MD

## 2021-02-18 ENCOUNTER — Encounter (INDEPENDENT_AMBULATORY_CARE_PROVIDER_SITE_OTHER): Payer: Self-pay | Admitting: Family Medicine

## 2021-02-18 ENCOUNTER — Ambulatory Visit (INDEPENDENT_AMBULATORY_CARE_PROVIDER_SITE_OTHER): Payer: BC Managed Care – PPO | Admitting: Family Medicine

## 2021-02-18 ENCOUNTER — Other Ambulatory Visit: Payer: Self-pay

## 2021-02-18 VITALS — BP 124/83 | HR 98 | Temp 97.9°F | Ht 65.0 in | Wt 257.0 lb

## 2021-02-18 DIAGNOSIS — E559 Vitamin D deficiency, unspecified: Secondary | ICD-10-CM | POA: Diagnosis not present

## 2021-02-18 DIAGNOSIS — Z6841 Body Mass Index (BMI) 40.0 and over, adult: Secondary | ICD-10-CM

## 2021-02-18 DIAGNOSIS — R7303 Prediabetes: Secondary | ICD-10-CM

## 2021-02-18 DIAGNOSIS — F3289 Other specified depressive episodes: Secondary | ICD-10-CM

## 2021-02-18 DIAGNOSIS — R5383 Other fatigue: Secondary | ICD-10-CM | POA: Diagnosis not present

## 2021-02-18 DIAGNOSIS — G4709 Other insomnia: Secondary | ICD-10-CM

## 2021-02-18 DIAGNOSIS — E669 Obesity, unspecified: Secondary | ICD-10-CM

## 2021-02-18 DIAGNOSIS — G43809 Other migraine, not intractable, without status migrainosus: Secondary | ICD-10-CM

## 2021-02-19 ENCOUNTER — Encounter (INDEPENDENT_AMBULATORY_CARE_PROVIDER_SITE_OTHER): Payer: Self-pay | Admitting: Family Medicine

## 2021-02-19 LAB — LIPID PANEL
Chol/HDL Ratio: 2 ratio (ref 0.0–4.4)
Cholesterol, Total: 135 mg/dL (ref 100–199)
HDL: 66 mg/dL (ref >39)
LDL Chol Calc (NIH): 55 mg/dL (ref 0–99)
Triglycerides: 72 mg/dL (ref 0–149)
VLDL Cholesterol Cal: 14 mg/dL (ref 5–40)

## 2021-02-19 LAB — COMPREHENSIVE METABOLIC PANEL
ALT: 13 IU/L (ref 0–32)
AST: 13 IU/L (ref 0–40)
Albumin/Globulin Ratio: 1.4 (ref 1.2–2.2)
Albumin: 3.9 g/dL (ref 3.8–4.8)
Alkaline Phosphatase: 64 IU/L (ref 44–121)
BUN/Creatinine Ratio: 12 (ref 9–23)
BUN: 11 mg/dL (ref 6–20)
Bilirubin Total: <0.2 mg/dL (ref 0.0–1.2)
CO2: 23 mmol/L (ref 20–29)
Calcium: 8.9 mg/dL (ref 8.7–10.2)
Chloride: 104 mmol/L (ref 96–106)
Creatinine, Ser: 0.92 mg/dL (ref 0.57–1.00)
Globulin, Total: 2.7 g/dL (ref 1.5–4.5)
Glucose: 84 mg/dL (ref 70–99)
Potassium: 4.4 mmol/L (ref 3.5–5.2)
Sodium: 141 mmol/L (ref 134–144)
Total Protein: 6.6 g/dL (ref 6.0–8.5)
eGFR: 82 mL/min/{1.73_m2} (ref >59)

## 2021-02-19 LAB — CBC WITH DIFFERENTIAL/PLATELET
Basophils Absolute: 0.1 10*3/uL (ref 0.0–0.2)
Basos: 1 %
EOS (ABSOLUTE): 0.2 10*3/uL (ref 0.0–0.4)
Eos: 2 %
Hematocrit: 41.5 % (ref 34.0–46.6)
Hemoglobin: 14.4 g/dL (ref 11.1–15.9)
Immature Grans (Abs): 0 10*3/uL (ref 0.0–0.1)
Immature Granulocytes: 0 %
Lymphocytes Absolute: 2.5 10*3/uL (ref 0.7–3.1)
Lymphs: 29 %
MCH: 31.6 pg (ref 26.6–33.0)
MCHC: 34.7 g/dL (ref 31.5–35.7)
MCV: 91 fL (ref 79–97)
Monocytes Absolute: 0.4 10*3/uL (ref 0.1–0.9)
Monocytes: 4 %
Neutrophils Absolute: 5.5 10*3/uL (ref 1.4–7.0)
Neutrophils: 64 %
Platelets: 386 10*3/uL (ref 150–450)
RBC: 4.55 x10E6/uL (ref 3.77–5.28)
RDW: 12.4 % (ref 11.7–15.4)
WBC: 8.6 10*3/uL (ref 3.4–10.8)

## 2021-02-19 LAB — VITAMIN D 25 HYDROXY (VIT D DEFICIENCY, FRACTURES): Vit D, 25-Hydroxy: 90.8 ng/mL (ref 30.0–100.0)

## 2021-02-19 LAB — INSULIN, RANDOM: INSULIN: 21 u[IU]/mL (ref 2.6–24.9)

## 2021-02-19 LAB — TSH: TSH: 2.1 u[IU]/mL (ref 0.450–4.500)

## 2021-02-19 MED ORDER — TIRZEPATIDE 10 MG/0.5ML ~~LOC~~ SOAJ
10.0000 mg | SUBCUTANEOUS | 1 refills | Status: DC
Start: 2021-02-19 — End: 2021-03-18

## 2021-02-19 NOTE — Telephone Encounter (Signed)
Dr.Wallace °

## 2021-02-20 NOTE — Telephone Encounter (Signed)
See other mychart message.

## 2021-02-20 NOTE — Progress Notes (Signed)
Chief Complaint:   OBESITY [name] is here to discuss her progress with her obesity treatment plan along with follow-up of her obesity related diagnoses. See Medical Weight Management Flowsheet for complete bioelectrical impedance results.  Today's visit was #: 66 Starting weight: 282 lbs Starting date: 11/01/2018 Weight change since last visit: 0 Total lbs lost to date: 25 lbs Total weight loss percentage to date: -8.87%  Nutrition Plan: Category 3 Plan for 0% of the time.  Activity: None. Anti-obesity medications: Mounjaro 5 mg subcutaneously weekly and phentermine 37.5 mg daily. Reported side effects: None.  Interim History: [name] has been on Mounjaro 5 mg (due to backorder).  She started 7.5 mg last month.  Still hungry/cravings.  She says Wellbutrin is causing dry mouth and sexual side effects.  Hydroxyzine added for insomnia/circadian dysregulation.  She is taking Elavil for migraines/IC.  Assessment/Plan:   1. Prediabetes, with polyphagia At goal. Goal is HgbA1c < 5.7.  Medication: Mounjaro 5 mg subcutaneously weekly and phentermine 37.5 mg daily (polyphagia).    Plan: Increase Mounjaro to 10 mg subcutaneously weekly.  She will continue to focus on protein-rich, low simple carbohydrate foods. We reviewed the importance of hydration, regular exercise for stress reduction, and restorative sleep. Will check labs today.  Lab Results  Component Value Date   HGBA1C 5.4 06/11/2020   Lab Results  Component Value Date   INSULIN 21.0 02/18/2021   INSULIN 17.5 06/11/2020   INSULIN 25.3 (H) 02/02/2019   INSULIN 23.9 11/01/2018   - Insulin, random - Lipid panel - Increase tirzepatide (MOUNJARO) 10 MG/0.5ML Pen; Inject 10 mg into the skin once a week.  Dispense: 2 mL; Refill: 1  2. Other insomnia This is moderately controlled.  Current treatment: hydroxyzine 50 mg at bedtime for sleep.  Hydroxyzine is obesogenic.  History of Ambien PRN.    Plan:  Consider Ambien again.   Recommend sleep hygiene measures including regular sleep schedule, optimal sleep environment, and relaxing presleep rituals.   3. Other migraine without status migrainosus, not intractable [name] is taking amitriptyline 50 mg at bedtime for migraine prevention.  Elavil is obesogenic.   Plan:  Consider changing Elavil to Trokendi.  4. Vitamin D deficiency Improving, but not optimized. She is taking OTC vitamin D 5000 IU daily.  Plan: Continue current OTC vitamin D supplementation. Will check vitamin D level today.  Lab Results  Component Value Date   VD25OH 90.8 02/18/2021   VD25OH 49.0 02/02/2019   VD25OH 78.6 11/01/2018   - VITAMIN D 25 Hydroxy (Vit-D Deficiency, Fractures)  5. Other fatigue [name] endorses more fatigue than usual recently.    Plan:  Check labs today, as per below.  - CBC with Differential/Platelet - Comprehensive metabolic panel - TSH  6. Other depression, with emotional eating Not at goal. Medication: Wellbutrin 200 mg twice daily and phentermine 37.5 mg daily.  She says the Wellbutrin is not helping.   Plan:  Okay to discontinue Wellbutrin.  Can maximize phentermine (currently taking 1/2 tablet). Discussed cues and consequences, how thoughts affect eating, model of thoughts, feelings, and behaviors, and strategies for change by focusing on the cue. Discussed cognitive distortions, coping thoughts, and how to change your thoughts.  7. Obesity BMI today is 42.8  Course: [name] is currently in the action stage of change. As such, her goal is to continue with weight loss efforts.   Nutrition goals: She has agreed to the Category 3 Plan.   Exercise goals: All adults should avoid  inactivity. Some physical activity is better than none, and adults who participate in any amount of physical activity gain some health benefits.  Behavioral modification strategies: increasing lean protein intake, decreasing simple carbohydrates, increasing vegetables, and  increasing water intake.  [name] has agreed to follow-up with our clinic in 4 weeks. She was informed of the importance of frequent follow-up visits to maximize her success with intensive lifestyle modifications for her multiple health conditions.   Objective:   Blood pressure 124/83, pulse 98, temperature 97.9 F (36.6 C), temperature source Oral, height 5\' 5"  (1.651 m), weight 257 lb (116.6 kg), SpO2 97 %. Body mass index is 42.77 kg/m.  General: Cooperative, alert, well developed, in no acute distress. HEENT: Conjunctivae and lids unremarkable. Cardiovascular: Regular rhythm.  Lungs: Normal work of breathing. Neurologic: No focal deficits.   Lab Results  Component Value Date   CREATININE 0.92 02/18/2021   BUN 11 02/18/2021   NA 141 02/18/2021   K 4.4 02/18/2021   CL 104 02/18/2021   CO2 23 02/18/2021   Lab Results  Component Value Date   ALT 13 02/18/2021   AST 13 02/18/2021   ALKPHOS 64 02/18/2021   BILITOT <0.2 02/18/2021   Lab Results  Component Value Date   HGBA1C 5.4 06/11/2020   HGBA1C 5.6 10/09/2019   HGBA1C 5.3 02/02/2019   HGBA1C 5.5 11/01/2018   HGBA1C 5.5 07/06/2017   Lab Results  Component Value Date   INSULIN 21.0 02/18/2021   INSULIN 17.5 06/11/2020   INSULIN 25.3 (H) 02/02/2019   INSULIN 23.9 11/01/2018   Lab Results  Component Value Date   TSH 2.100 02/18/2021   Lab Results  Component Value Date   CHOL 135 02/18/2021   HDL 66 02/18/2021   LDLCALC 55 02/18/2021   TRIG 72 02/18/2021   CHOLHDL 2.0 02/18/2021   Lab Results  Component Value Date   VD25OH 90.8 02/18/2021   VD25OH 49.0 02/02/2019   VD25OH 78.6 11/01/2018   Lab Results  Component Value Date   WBC 8.6 02/18/2021   HGB 14.4 02/18/2021   HCT 41.5 02/18/2021   MCV 91 02/18/2021   PLT 386 02/18/2021   Attestation Statements:   Reviewed by clinician on day of visit: allergies, medications, problem list, medical history, surgical history, family history, social  history, and previous encounter notes.  I, Water quality scientist, CMA, am acting as transcriptionist for Briscoe Deutscher, DO  I have reviewed the above documentation for accuracy and completeness, and I agree with the above. -  Briscoe Deutscher, DO, MS, FAAFP, DABOM - Family and Bariatric Medicine.

## 2021-03-18 ENCOUNTER — Encounter (INDEPENDENT_AMBULATORY_CARE_PROVIDER_SITE_OTHER): Payer: Self-pay | Admitting: Family Medicine

## 2021-03-18 ENCOUNTER — Telehealth (INDEPENDENT_AMBULATORY_CARE_PROVIDER_SITE_OTHER): Payer: BC Managed Care – PPO | Admitting: Family Medicine

## 2021-03-18 ENCOUNTER — Other Ambulatory Visit: Payer: Self-pay

## 2021-03-18 ENCOUNTER — Other Ambulatory Visit (HOSPITAL_COMMUNITY): Payer: Self-pay

## 2021-03-18 DIAGNOSIS — G43109 Migraine with aura, not intractable, without status migrainosus: Secondary | ICD-10-CM

## 2021-03-18 DIAGNOSIS — R7303 Prediabetes: Secondary | ICD-10-CM

## 2021-03-18 DIAGNOSIS — E669 Obesity, unspecified: Secondary | ICD-10-CM | POA: Diagnosis not present

## 2021-03-18 DIAGNOSIS — Z6841 Body Mass Index (BMI) 40.0 and over, adult: Secondary | ICD-10-CM

## 2021-03-18 DIAGNOSIS — E66813 Obesity, class 3: Secondary | ICD-10-CM

## 2021-03-18 MED ORDER — TOPIRAMATE 50 MG PO TABS
50.0000 mg | ORAL_TABLET | Freq: Every day | ORAL | 0 refills | Status: DC
  Filled 2021-03-18: qty 30, 30d supply, fill #0

## 2021-03-18 MED ORDER — TIRZEPATIDE 10 MG/0.5ML ~~LOC~~ SOAJ
10.0000 mg | SUBCUTANEOUS | 1 refills | Status: DC
  Filled 2021-03-18: qty 2, 28d supply, fill #0

## 2021-03-18 NOTE — Progress Notes (Signed)
TeleHealth Visit:  Due to the COVID-19 pandemic, this visit was completed with telemedicine (audio/video) technology to reduce patient and provider exposure as well as to preserve personal protective equipment.   [name] has verbally consented to this TeleHealth visit. The patient is located at home, the provider is located at the [facility] office. The participants in this visit include the listed provider and patient. The visit was conducted today via video.   Chief Complaint: OBESITY [name] is here to discuss her progress with her obesity treatment plan along with follow-up of her obesity related diagnoses. [name] is on the Category 3 Plan and states she is following her eating plan approximately 75% of the time. [name] states she is going to the gym 45 minutes 1 times per week.  Today's visit was #: 34 Starting weight: 282 lbs Starting date: 11/01/2018  Interim History: [name] has not weighed recently at home. She is mostly following PC/[place] rather than journaling. She tends to eat protein bars and drink shakes. She is on phentermine 1/2 tab daily.  Subjective:   1. Prediabetes, with polyphagia [name] is on Mounjaro 7.5 mg weekly because she has been unable to obtain 10 mg.  She is tolerating it well but feels she could use more appetite suppression.  Lab Results  Component Value Date   HGBA1C 5.4 06/11/2020   Lab Results  Component Value Date   INSULIN 21.0 02/18/2021   INSULIN 17.5 06/11/2020   INSULIN 25.3 (H) 02/02/2019   INSULIN 23.9 11/01/2018   2. Migraine with aura and without status migrainosus, not intractable Pt is on Elavil for migraine prophylaxis at 50 mg daily.  Assessment/Plan:   1. Prediabetes, with polyphagia [name] will continue to work on weight loss, exercise, and decreasing simple carbohydrates to help decrease the risk of diabetes. Increase Mounjaro to 10 mg weekly. Use alternative birth control for one  month.  Increase & Refill- tirzepatide (MOUNJARO) 10 MG/0.5ML Pen; Inject 10 mg into the skin once a week.  Dispense: 2 mL; Refill: 1  2. Migraine with aura and without status migrainosus, not intractable We will taper off Elavil and start Topamax for a more weight neutral medication.  Taper off Elavil- instructions provided. Start Topamax 50 mg qhs. Pt will take 25 mg for one week, then 50 mg nightly. Pt advised she must practice good birth control because Topamax can cause birth defects.  Start- topiramate (TOPAMAX) 50 MG tablet; Take 1 tablet (50 mg total) by mouth at bedtime.  Dispense: 30 tablet; Refill: 0  3. Obesity BMI today is 42.77 [name] is currently in the action stage of change. As such, her goal is to continue with weight loss efforts. She has agreed to keeping a food journal and adhering to recommended goals of 1800 calories and 100 grams protein or practicing portion control and making smarter food choices, such as increasing vegetables and decreasing simple carbohydrates.   Pt is to limit protein shakes/bars to 1 per day.  Exercise goals:  As is- increase frequency.  Behavioral modification strategies: increasing lean protein intake, decreasing simple carbohydrates, and increasing water intake.  [name] has agreed to follow-up with our clinic in 3 weeks.  Objective:   VITALS: Per patient if applicable, see vitals. GENERAL: Alert and in no acute distress. CARDIOPULMONARY: No increased WOB. Speaking in clear sentences.  PSYCH: Pleasant and cooperative. Speech normal rate and rhythm. Affect is appropriate. Insight and judgement are appropriate. Attention is focused, linear, and appropriate.  NEURO: Oriented  as arrived to appointment on time with no prompting.   Lab Results  Component Value Date   CREATININE 0.92 02/18/2021   BUN 11 02/18/2021   NA 141 02/18/2021   K 4.4 02/18/2021   CL 104 02/18/2021   CO2 23 02/18/2021   Lab Results  Component Value Date   ALT  13 02/18/2021   AST 13 02/18/2021   ALKPHOS 64 02/18/2021   BILITOT <0.2 02/18/2021   Lab Results  Component Value Date   HGBA1C 5.4 06/11/2020   HGBA1C 5.6 10/09/2019   HGBA1C 5.3 02/02/2019   HGBA1C 5.5 11/01/2018   HGBA1C 5.5 07/06/2017   Lab Results  Component Value Date   INSULIN 21.0 02/18/2021   INSULIN 17.5 06/11/2020   INSULIN 25.3 (H) 02/02/2019   INSULIN 23.9 11/01/2018   Lab Results  Component Value Date   TSH 2.100 02/18/2021   Lab Results  Component Value Date   CHOL 135 02/18/2021   HDL 66 02/18/2021   LDLCALC 55 02/18/2021   TRIG 72 02/18/2021   CHOLHDL 2.0 02/18/2021   Lab Results  Component Value Date   VD25OH 90.8 02/18/2021   VD25OH 49.0 02/02/2019   VD25OH 78.6 11/01/2018   Lab Results  Component Value Date   WBC 8.6 02/18/2021   HGB 14.4 02/18/2021   HCT 41.5 02/18/2021   MCV 91 02/18/2021   PLT 386 02/18/2021   No results found for: IRON, TIBC, FERRITIN  Attestation Statements:   Reviewed by clinician on day of visit: allergies, medications, problem list, medical history, surgical history, family history, social history, and previous encounter notes.  Coral Ceo, CMA, am acting as Location manager for [employer], [place].  I have reviewed the above documentation for accuracy and completeness, and I agree with the above. - Georgianne Fick, FNP

## 2021-03-19 ENCOUNTER — Other Ambulatory Visit (HOSPITAL_COMMUNITY): Payer: Self-pay

## 2021-03-19 DIAGNOSIS — G43109 Migraine with aura, not intractable, without status migrainosus: Secondary | ICD-10-CM | POA: Insufficient documentation

## 2021-04-02 IMAGING — DX DG CHEST 2V
2 series · 2 of 2 positions shown · non-contrast
Comparison: 07/29/2006

CLINICAL DATA: Shortness of breath and tachycardia

EXAM:
CHEST - 2 VIEW

[chest pa]
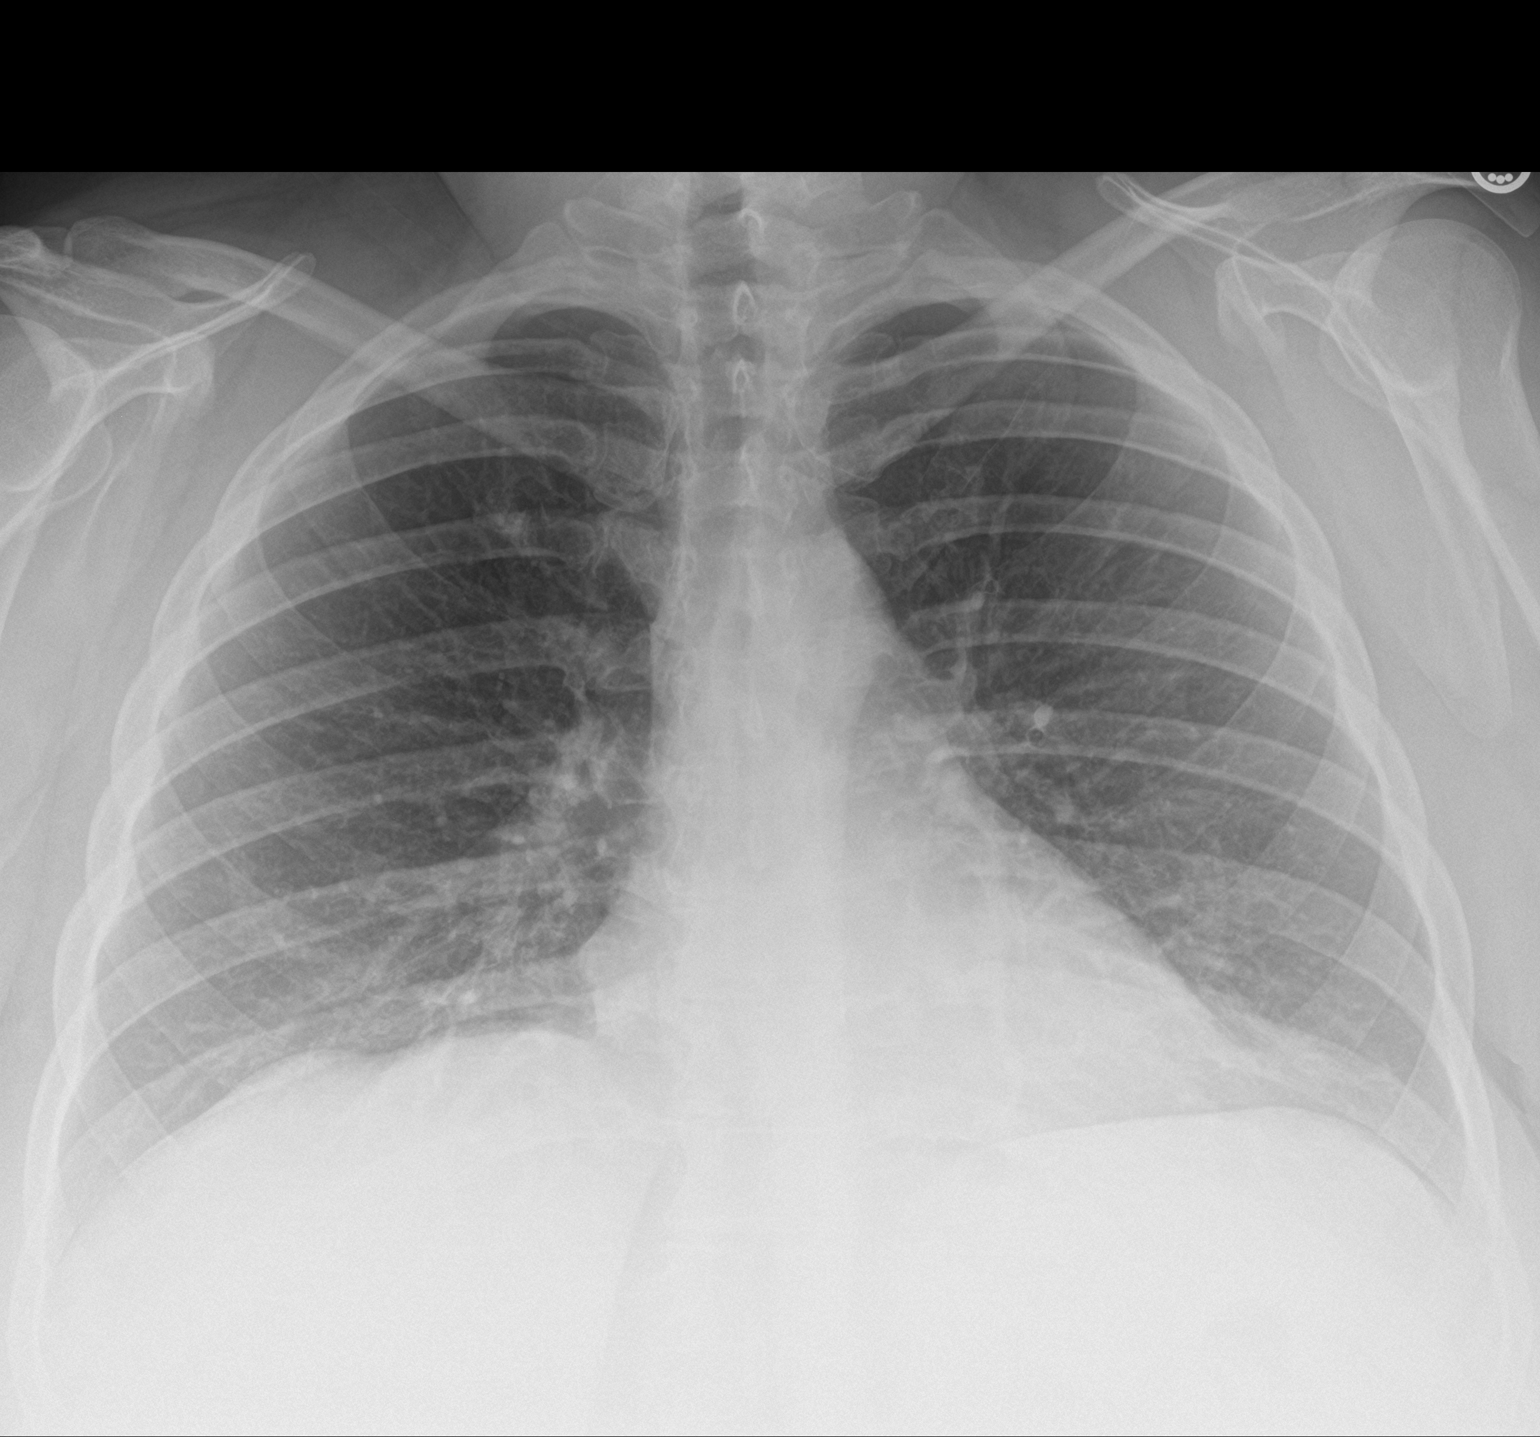

[chest lat]
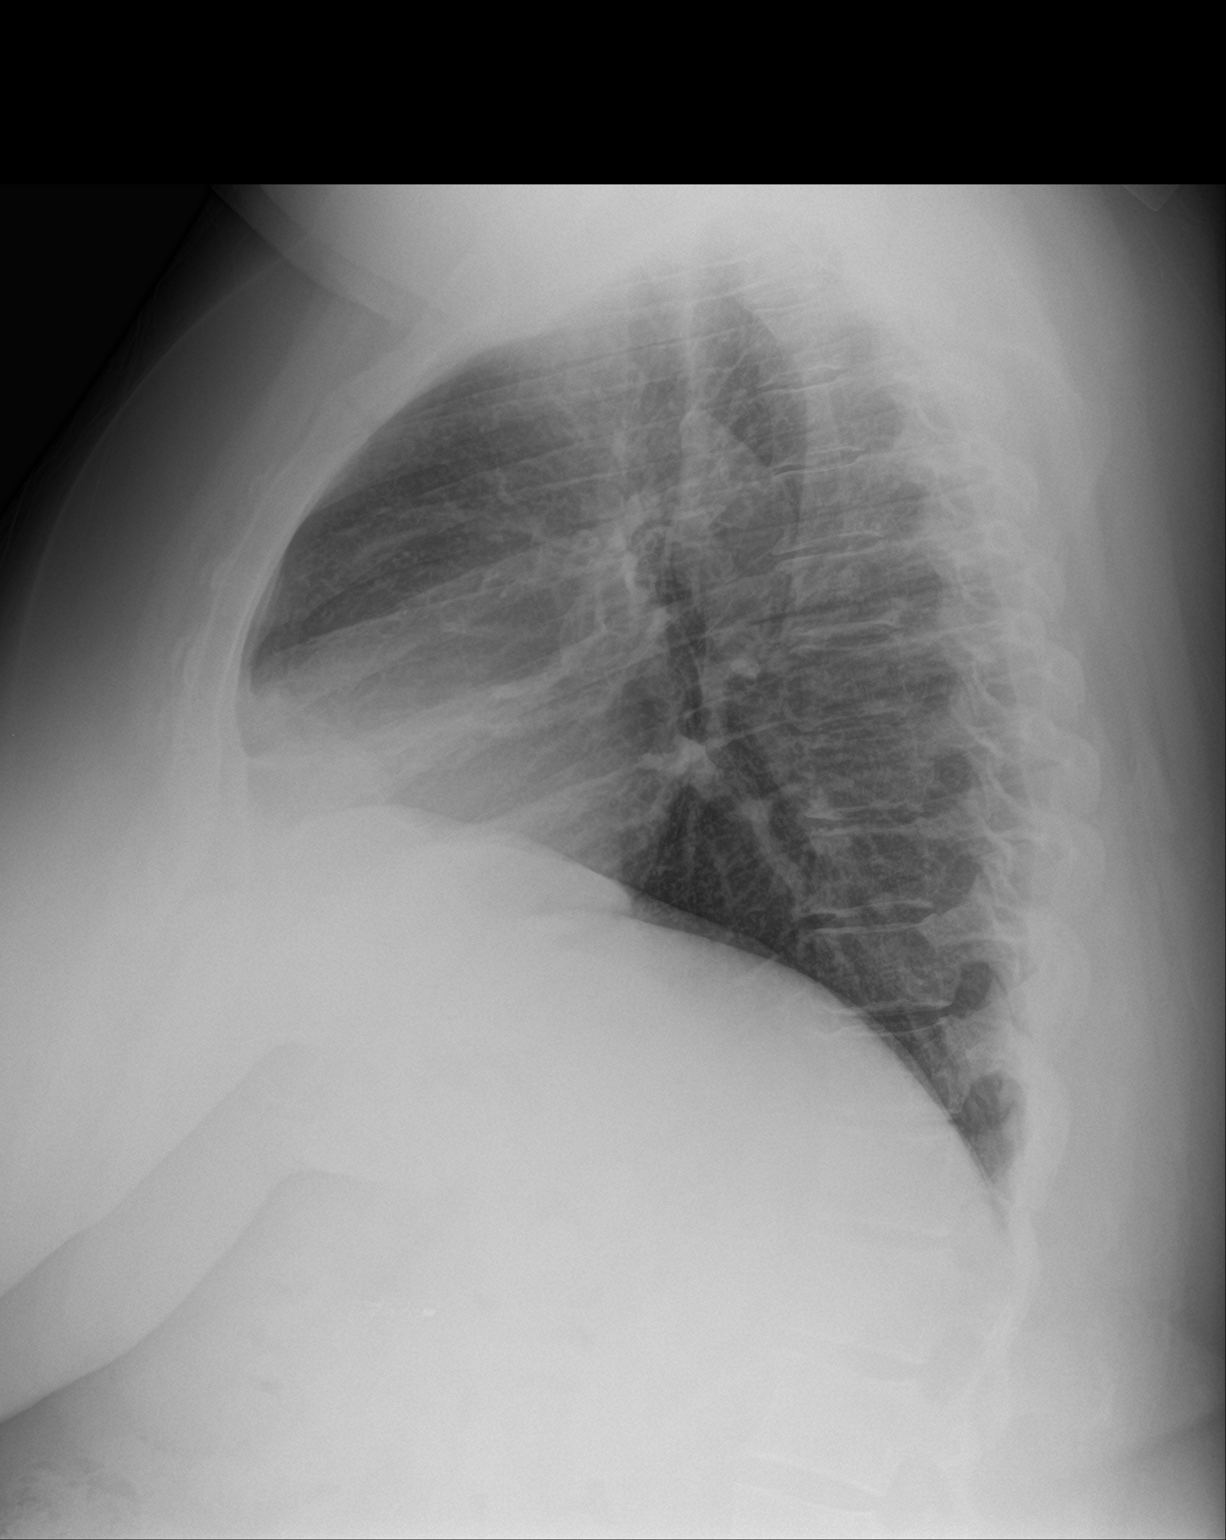

[2 of 2 positions shown; findings below may reference images not displayed]

FINDINGS: The heart size and mediastinal contours are within normal limits.
Minimal atelectasis or scar at the lingula. The visualized skeletal
structures are unremarkable.
IMPRESSION: No active cardiopulmonary disease.

## 2021-04-02 IMAGING — CT CT ANGIO CHEST
2 of 6 series · 18 of 36 positions shown · IV contrast (omnipaque)
Comparison: Chest radiographs earlier today. CTA chest 05/25/2009.

CLINICAL DATA: 33-year-old female with central chest pain radiating
to the back. Shortness of breath.

EXAM:
CT ANGIOGRAPHY CHEST WITH CONTRAST
TECHNIQUE: Multidetector CT imaging of the chest was performed using the
standard protocol during bolus administration of intravenous
contrast. Multiplanar CT image reconstructions and MIPs were
obtained to evaluate the vascular anatomy.
CONTRAST:  75mL OMNIPAQUE IOHEXOL 350 MG/ML SOLN

[Series 8: pe thins · axial · 0.83mm/px · z∈[+1134,+1338]mm · 17 of 325 slices shown]
[im 17/325  lung]
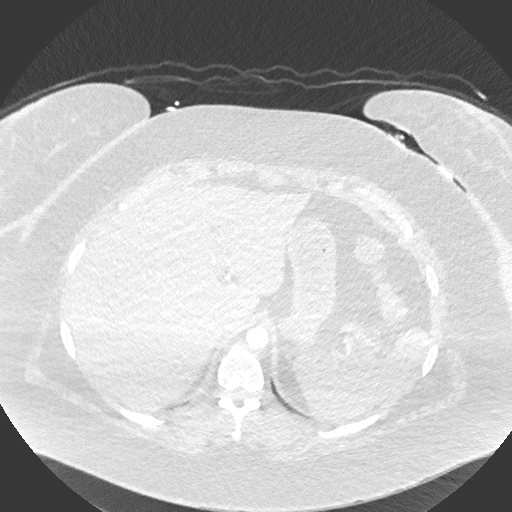
[im 33/325  mediastinal]
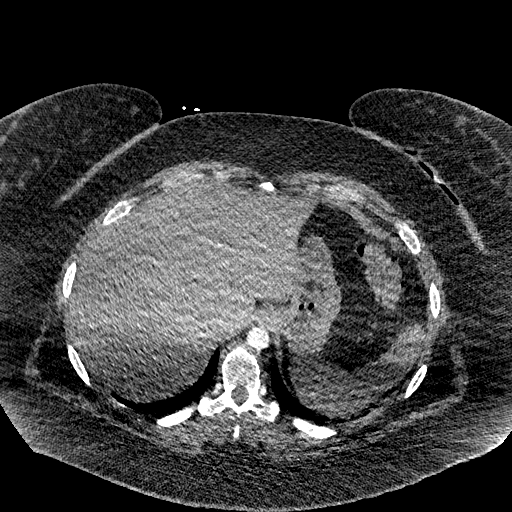
[im 49/325  lung]
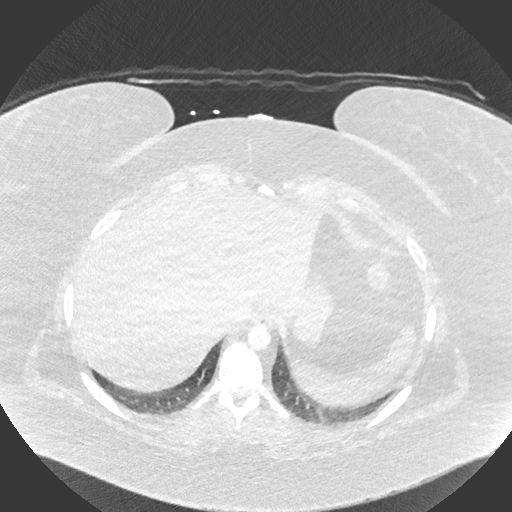
[im 65/325  mediastinal]
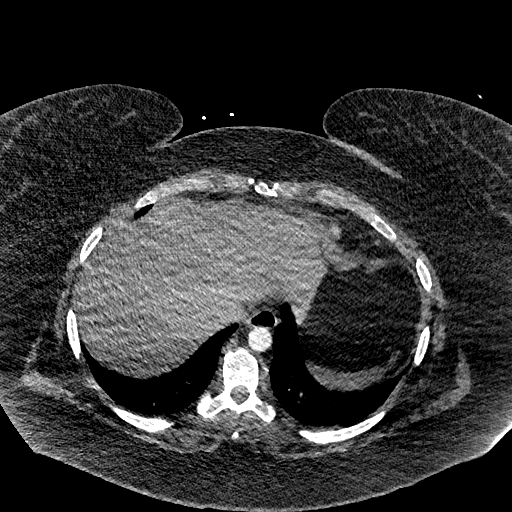
[im 98/325  lung]
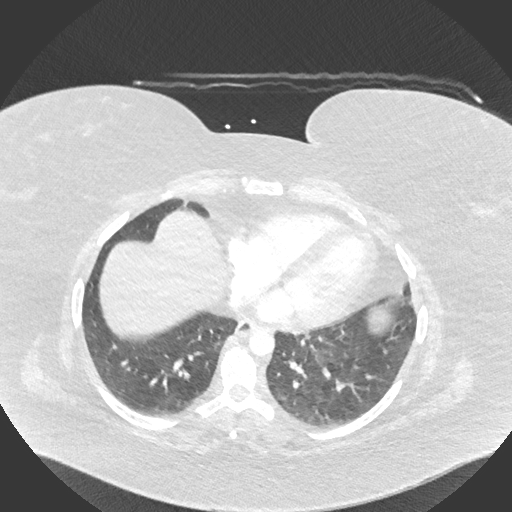
[im 114/325  mediastinal]
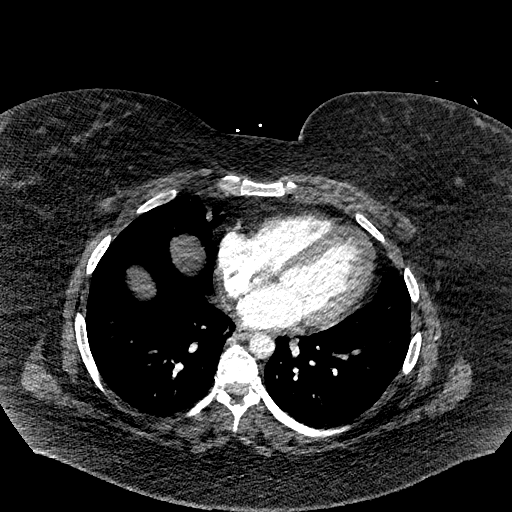
[im 130/325  lung]
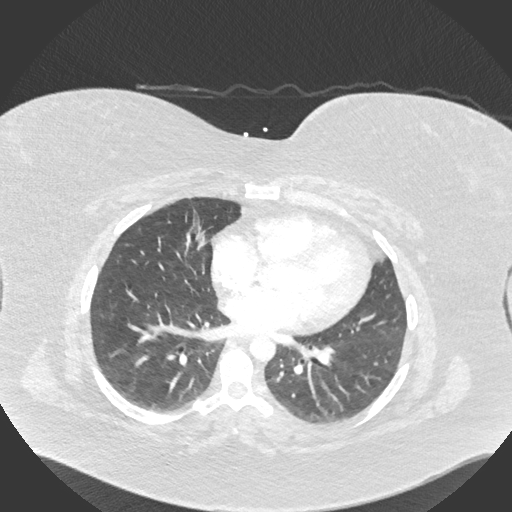
[im 146/325  mediastinal]
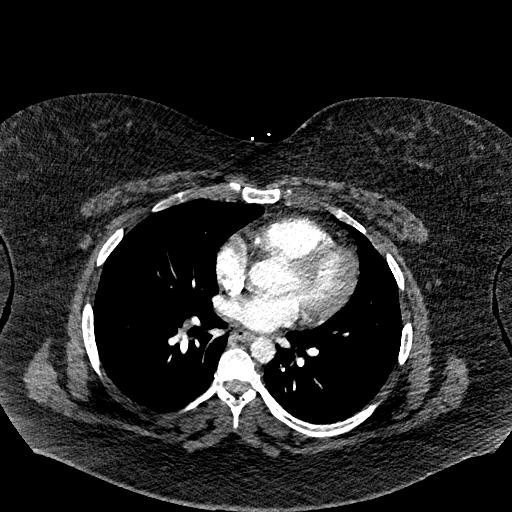
[im 163/325  lung]
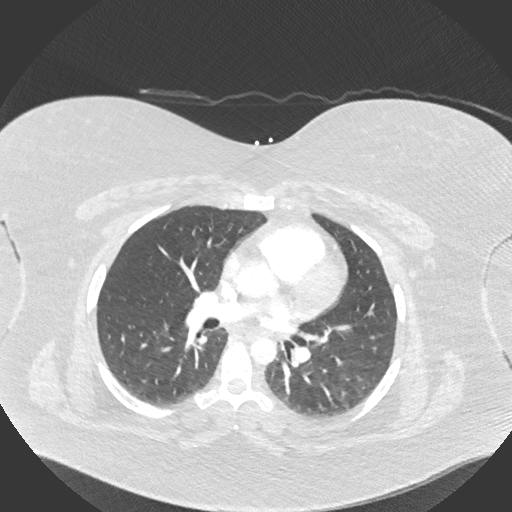
[im 179/325  mediastinal]
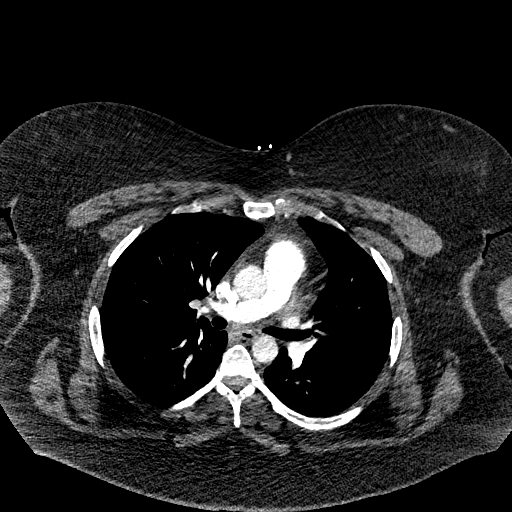
[im 195/325  lung]
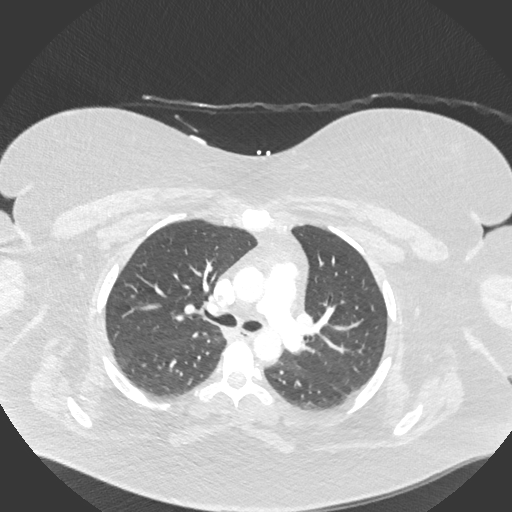
[im 211/325  mediastinal]
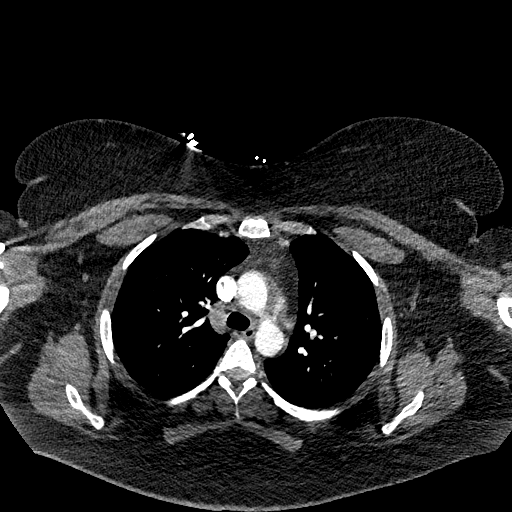
[im 227/325  lung]
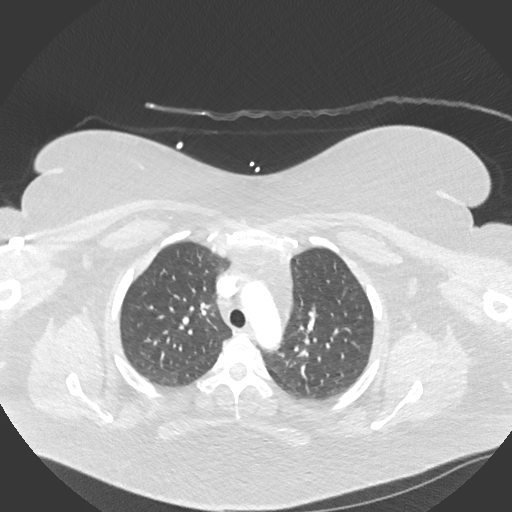
[im 260/325  mediastinal]
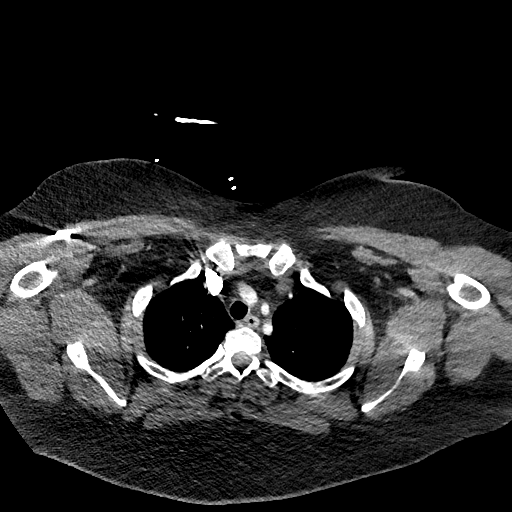
[im 276/325  lung]
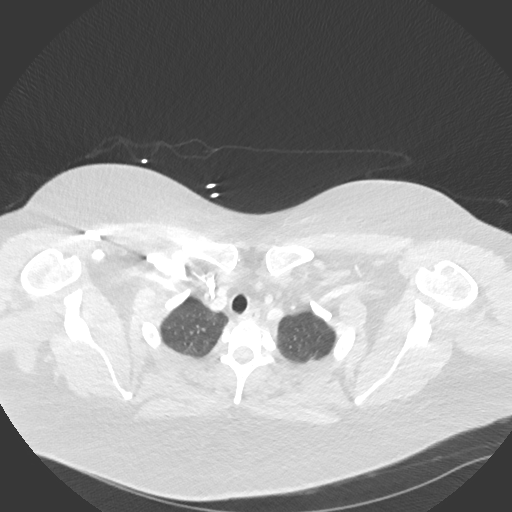
[im 292/325  mediastinal]
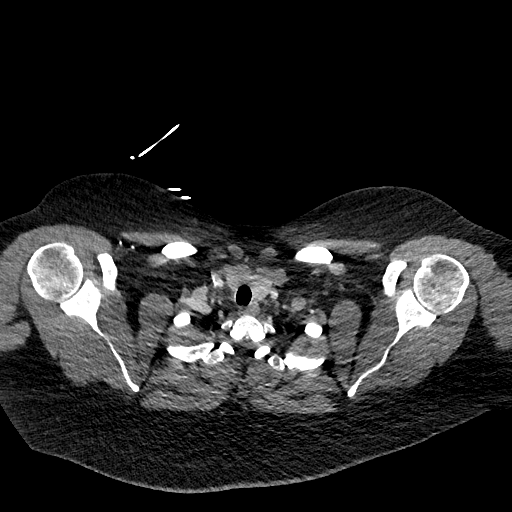
[im 308/325  lung]
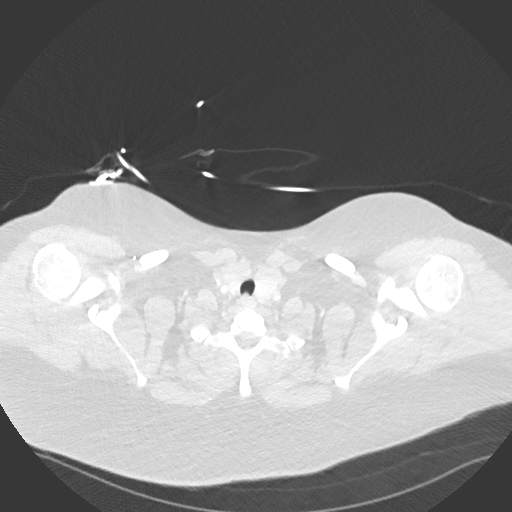

[Series 9: pe 2mm cor · coronal · 0.45mm/px · 1 of 158 slices shown]
[im 79/158  mediastinal]
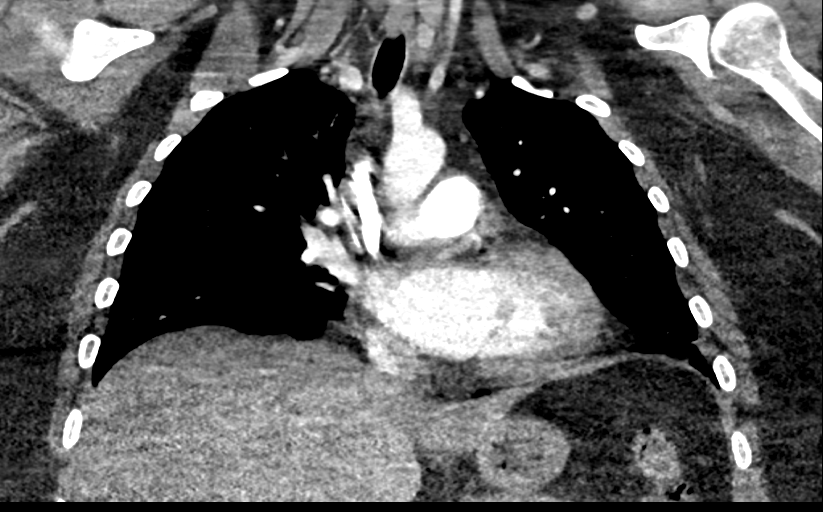

[18 of 36 positions shown; findings below may reference images not displayed]

FINDINGS: Cardiovascular: Initially poor contrast bolus timing in the
pulmonary arterial tree. But this was repeated at 3533 hours with
good contrast timing.

No focal filling defect identified in the pulmonary arteries to
suggest acute pulmonary embolism.

Negative visible aorta. No definite calcified coronary artery
atherosclerosis. Cardiac size at the upper limits of normal. No
pericardial effusion.

Mediastinum/Nodes: Negative. No lymphadenopathy.

Lungs/Pleura: Major airways are patent. Low lung volumes similar to
the 4699 exam. Mild perihilar and dependent pulmonary ground-glass
opacity, with mild bilateral lower lobe mosaic attenuation. This
most resembles a mixture of atelectasis and gas trapping.

With the 2nd set of lung images on [DATE] 24 hours lung volumes are
improved and the atelectatic opacity has substantially regressed. No
pleural effusion or inflammatory appearing pulmonary opacity.

Upper Abdomen: Negative visible liver, spleen, adrenal glands and
bowel in the upper abdomen.

Musculoskeletal: Age advanced disc and endplate degeneration in the
thoracic spine. No acute osseous abnormality identified.

Review of the MIP images confirms the above findings.
IMPRESSION: 1. No evidence of acute pulmonary embolus.
2. Mild atelectasis. No other acute findings in the chest.

## 2021-04-16 ENCOUNTER — Encounter (INDEPENDENT_AMBULATORY_CARE_PROVIDER_SITE_OTHER): Payer: Self-pay | Admitting: Family Medicine

## 2021-04-16 ENCOUNTER — Other Ambulatory Visit: Payer: Self-pay

## 2021-04-16 ENCOUNTER — Ambulatory Visit (INDEPENDENT_AMBULATORY_CARE_PROVIDER_SITE_OTHER): Payer: BC Managed Care – PPO | Admitting: Family Medicine

## 2021-04-16 VITALS — BP 118/80 | HR 87 | Temp 98.0°F | Ht 65.0 in | Wt 259.0 lb

## 2021-04-16 DIAGNOSIS — Z6841 Body Mass Index (BMI) 40.0 and over, adult: Secondary | ICD-10-CM

## 2021-04-16 DIAGNOSIS — G43109 Migraine with aura, not intractable, without status migrainosus: Secondary | ICD-10-CM | POA: Diagnosis not present

## 2021-04-16 DIAGNOSIS — R7303 Prediabetes: Secondary | ICD-10-CM

## 2021-04-16 DIAGNOSIS — E669 Obesity, unspecified: Secondary | ICD-10-CM

## 2021-04-16 DIAGNOSIS — G4709 Other insomnia: Secondary | ICD-10-CM

## 2021-04-16 MED ORDER — PHENTERMINE HCL 37.5 MG PO TABS: 37.5000 mg | ORAL_TABLET | Freq: Every day | ORAL | 0 refills | Status: DC

## 2021-04-16 MED ORDER — TOPIRAMATE 50 MG PO TABS: 50.0000 mg | ORAL_TABLET | Freq: Every day | ORAL | 0 refills | Status: DC

## 2021-04-16 MED ORDER — ZOLPIDEM TARTRATE 5 MG PO TABS: 5.0000 mg | ORAL_TABLET | Freq: Every day | ORAL | 2 refills | Status: DC

## 2021-04-16 MED ORDER — TIRZEPATIDE 7.5 MG/0.5ML ~~LOC~~ SOAJ: 7.5000 mg | SUBCUTANEOUS | 1 refills | Status: DC

## 2021-04-16 MED ORDER — TIRZEPATIDE 10 MG/0.5ML ~~LOC~~ SOAJ: 10.0000 mg | SUBCUTANEOUS | 1 refills | Status: DC

## 2021-04-16 MED ORDER — TIRZEPATIDE 12.5 MG/0.5ML ~~LOC~~ SOAJ: 12.5000 mg | SUBCUTANEOUS | 1 refills | Status: DC

## 2021-04-16 NOTE — Telephone Encounter (Signed)
Dr.Wallace °

## 2021-04-25 NOTE — Progress Notes (Signed)
Chief Complaint:   OBESITY [name] is here to discuss her progress with her obesity treatment plan along with follow-up of her obesity related diagnoses. See Medical Weight Management Flowsheet for complete bioelectrical impedance results.  Today's visit was #: 35 Starting weight: 282 lbs Starting date: 11/01/2018 Weight change since last visit: +2 Total lbs lost to date: 23 lbs 16%Total weight loss percentage to date: -8.16%  Nutrition Plan: Keeping a food journal and adhering to recommended goals of 1800 calories and 100 grams of protein daily for 74% of the time.  Activity: None. Anti-obesity medications: Mounjaro 10 mg subcutaneously weekly and phentermine 37.5 mg daily. Reported side effects: None.  Interim History: [name] is currently on her menses.  She says she has had a good transition off amitriptyline to Topamax.  Assessment/Plan:   1. Other insomnia This is poorly controlled. Dysfunction: frequent night time awakening, difficulty falling asleep, and non-restful sleep.  Current treatment: hydroxyzine 50 mg at bedtime..  Plan:  Discontinue hydroxyzine and start Ambien 5 mg at bedtime. Recommend sleep hygiene measures including regular sleep schedule, optimal sleep environment, and relaxing presleep rituals.  After discussion, patient would like to start below medication. Expectations, risks, and potential side effects reviewed.   - Start zolpidem (AMBIEN) 5 MG tablet; Take 1 tablet (5 mg total) by mouth at bedtime.  Dispense: 30 tablet; Refill: 2  2. Prediabetes, with polyphagia Improving, but not optimized. Goal is HgbA1c < 5.7.  Medication: Mounjaro 10 mg subcutaneously weekly..    Plan: Continue Mounjaro 10 mg subcutaneously weekly.  Will refill today and also send in 7.5 mg and 12.5 mg doses.  She will continue to focus on protein-rich, low simple carbohydrate foods. We reviewed the importance of hydration, regular exercise for stress reduction, and restorative  sleep.   Lab Results  Component Value Date   HGBA1C 5.4 06/11/2020   Lab Results  Component Value Date   INSULIN 21.0 02/18/2021   INSULIN 17.5 06/11/2020   INSULIN 25.3 (H) 02/02/2019   INSULIN 23.9 11/01/2018   - Refill;; tirzepatide (MOUNJARO) 10 MG/0.5ML Pen; Inject 10 mg into the skin once a week.  Dispense: 2 mL; Refill: 1 - tirzepatide (MOUNJARO) 7.5 MG/0.5ML Pen; Inject 7.5 mg into the skin once a week.  Dispense: 2 mL; Refill: 1 - tirzepatide (MOUNJARO) 12.5 MG/0.5ML Pen; Inject 12.5 mg into the skin once a week.  Dispense: 2 mL; Refill: 1  3. Migraine with aura and without status migrainosus, not intractable [name] will start Topamax 50 mg at bedtime for migraine prevention. Risk versus benefits of medication reviewed. The patient understands monitoring parameters and red flags.   - Start topiramate (TOPAMAX) 50 MG tablet; Take 1 tablet (50 mg total) by mouth at bedtime.  Dispense: 30 tablet; Refill: 0  4. Obesity BMI today is 43.2  - Refill phentermine (ADIPEX-P) 37.5 MG tablet; Take 1 tablet (37.5 mg total) by mouth daily.  Dispense: 30 tablet; Refill: 0  Course: [name] is currently in the action stage of change. As such, her goal is to continue with weight loss efforts.   Nutrition goals: She has agreed to keeping a food journal and adhering to recommended goals of 1500 calories and 100 protein. As well as Practicing portion control and making smarter food choices, such as increasing vegetables and decreasing simple carbohydrates.  Exercise goals:  As is.  Behavioral modification strategies: increasing lean protein intake, decreasing simple carbohydrates, increasing vegetables, increasing water intake, and decreasing liquid calories.  [name]  has agreed to follow-up with our clinic in 4 weeks. She was informed of the importance of frequent follow-up visits to maximize her success with intensive lifestyle modifications for her multiple health conditions.    [name] was informed we would discuss her lab results at her next visit unless there is a critical issue that needs to be addressed sooner. [name] agreed to keep her next visit at the agreed upon time to discuss these results.  Objective:   Blood pressure 118/80, pulse 87, temperature 98 F (36.7 C), temperature source Oral, height 5\' 5"  (1.651 m), weight 259 lb (117.5 kg), SpO2 99 %. Body mass index is 43.1 kg/m.  General: Cooperative, alert, well developed, in no acute distress. HEENT: Conjunctivae and lids unremarkable. Cardiovascular: Regular rhythm.  Lungs: Normal work of breathing. Neurologic: No focal deficits.   Lab Results  Component Value Date   CREATININE 0.92 02/18/2021   BUN 11 02/18/2021   NA 141 02/18/2021   K 4.4 02/18/2021   CL 104 02/18/2021   CO2 23 02/18/2021   Lab Results  Component Value Date   ALT 13 02/18/2021   AST 13 02/18/2021   ALKPHOS 64 02/18/2021   BILITOT <0.2 02/18/2021   Lab Results  Component Value Date   HGBA1C 5.4 06/11/2020   HGBA1C 5.6 10/09/2019   HGBA1C 5.3 02/02/2019   HGBA1C 5.5 11/01/2018   HGBA1C 5.5 07/06/2017   Lab Results  Component Value Date   INSULIN 21.0 02/18/2021   INSULIN 17.5 06/11/2020   INSULIN 25.3 (H) 02/02/2019   INSULIN 23.9 11/01/2018   Lab Results  Component Value Date   TSH 2.100 02/18/2021   Lab Results  Component Value Date   CHOL 135 02/18/2021   HDL 66 02/18/2021   LDLCALC 55 02/18/2021   TRIG 72 02/18/2021   CHOLHDL 2.0 02/18/2021   Lab Results  Component Value Date   VD25OH 90.8 02/18/2021   VD25OH 49.0 02/02/2019   VD25OH 78.6 11/01/2018   Lab Results  Component Value Date   WBC 8.6 02/18/2021   HGB 14.4 02/18/2021   HCT 41.5 02/18/2021   MCV 91 02/18/2021   PLT 386 02/18/2021   Attestation Statements:   Reviewed by clinician on day of visit: allergies, medications, problem list, medical history, surgical history, family history, social history, and previous  encounter notes.  I, 02/20/2021, CMA, am acting as transcriptionist for Insurance claims handler, DO  I have reviewed the above documentation for accuracy and completeness, and I agree with the above. -  Helane Rima, DO, MS, FAAFP, DABOM - Family and Bariatric Medicine.

## 2021-05-13 ENCOUNTER — Other Ambulatory Visit (INDEPENDENT_AMBULATORY_CARE_PROVIDER_SITE_OTHER): Payer: Self-pay | Admitting: Family Medicine

## 2021-05-13 DIAGNOSIS — G43109 Migraine with aura, not intractable, without status migrainosus: Secondary | ICD-10-CM

## 2021-05-19 ENCOUNTER — Encounter (INDEPENDENT_AMBULATORY_CARE_PROVIDER_SITE_OTHER): Payer: Self-pay | Admitting: Family Medicine

## 2021-05-19 ENCOUNTER — Other Ambulatory Visit (INDEPENDENT_AMBULATORY_CARE_PROVIDER_SITE_OTHER): Payer: Self-pay

## 2021-05-19 DIAGNOSIS — G43109 Migraine with aura, not intractable, without status migrainosus: Secondary | ICD-10-CM

## 2021-05-19 MED ORDER — TOPIRAMATE 50 MG PO TABS: 50.0000 mg | ORAL_TABLET | Freq: Every day | ORAL | 0 refills | Status: DC

## 2021-06-18 DIAGNOSIS — G4700 Insomnia, unspecified: Secondary | ICD-10-CM | POA: Diagnosis not present

## 2021-06-18 DIAGNOSIS — K5909 Other constipation: Secondary | ICD-10-CM | POA: Diagnosis not present

## 2021-06-18 DIAGNOSIS — R632 Polyphagia: Secondary | ICD-10-CM | POA: Diagnosis not present

## 2021-06-18 DIAGNOSIS — F411 Generalized anxiety disorder: Secondary | ICD-10-CM | POA: Diagnosis not present

## 2021-07-24 DIAGNOSIS — Z23 Encounter for immunization: Secondary | ICD-10-CM | POA: Diagnosis not present

## 2021-07-24 DIAGNOSIS — Z00129 Encounter for routine child health examination without abnormal findings: Secondary | ICD-10-CM | POA: Diagnosis not present

## 2021-07-24 DIAGNOSIS — F419 Anxiety disorder, unspecified: Secondary | ICD-10-CM | POA: Diagnosis not present

## 2021-07-24 DIAGNOSIS — M549 Dorsalgia, unspecified: Secondary | ICD-10-CM | POA: Diagnosis not present

## 2021-07-24 DIAGNOSIS — B079 Viral wart, unspecified: Secondary | ICD-10-CM | POA: Diagnosis not present

## 2021-09-03 ENCOUNTER — Encounter (INDEPENDENT_AMBULATORY_CARE_PROVIDER_SITE_OTHER): Payer: Self-pay

## 2021-10-01 DIAGNOSIS — R632 Polyphagia: Secondary | ICD-10-CM | POA: Diagnosis not present

## 2021-10-01 DIAGNOSIS — E282 Polycystic ovarian syndrome: Secondary | ICD-10-CM | POA: Diagnosis not present

## 2021-10-01 DIAGNOSIS — G4700 Insomnia, unspecified: Secondary | ICD-10-CM | POA: Diagnosis not present

## 2021-10-01 DIAGNOSIS — Z6841 Body Mass Index (BMI) 40.0 and over, adult: Secondary | ICD-10-CM | POA: Diagnosis not present

## 2021-10-16 ENCOUNTER — Other Ambulatory Visit: Payer: Self-pay

## 2021-10-16 ENCOUNTER — Other Ambulatory Visit: Payer: Self-pay | Admitting: Physician Assistant

## 2021-10-16 ENCOUNTER — Encounter: Payer: Self-pay | Admitting: Physician Assistant

## 2021-10-16 DIAGNOSIS — F32A Depression, unspecified: Secondary | ICD-10-CM

## 2021-10-16 MED ORDER — VENLAFAXINE HCL ER 150 MG PO CP24
ORAL_CAPSULE | ORAL | 1 refills | Status: DC
Start: 2021-10-16 — End: 2022-06-19

## 2021-10-20 ENCOUNTER — Encounter: Payer: Self-pay | Admitting: *Deleted

## 2021-11-03 ENCOUNTER — Ambulatory Visit (INDEPENDENT_AMBULATORY_CARE_PROVIDER_SITE_OTHER): Payer: BC Managed Care – PPO | Admitting: Physician Assistant

## 2021-11-03 ENCOUNTER — Encounter: Payer: Self-pay | Admitting: Physician Assistant

## 2021-11-03 VITALS — BP 118/76 | HR 94 | Temp 97.5°F | Ht 65.0 in | Wt 274.0 lb

## 2021-11-03 DIAGNOSIS — E88819 Insulin resistance, unspecified: Secondary | ICD-10-CM

## 2021-11-03 DIAGNOSIS — R7303 Prediabetes: Secondary | ICD-10-CM

## 2021-11-03 DIAGNOSIS — W57XXXA Bitten or stung by nonvenomous insect and other nonvenomous arthropods, initial encounter: Secondary | ICD-10-CM

## 2021-11-03 DIAGNOSIS — E559 Vitamin D deficiency, unspecified: Secondary | ICD-10-CM

## 2021-11-03 DIAGNOSIS — Z0000 Encounter for general adult medical examination without abnormal findings: Secondary | ICD-10-CM

## 2021-11-03 DIAGNOSIS — Z6841 Body Mass Index (BMI) 40.0 and over, adult: Secondary | ICD-10-CM

## 2021-11-03 DIAGNOSIS — S40861A Insect bite (nonvenomous) of right upper arm, initial encounter: Secondary | ICD-10-CM

## 2021-11-03 DIAGNOSIS — M778 Other enthesopathies, not elsewhere classified: Secondary | ICD-10-CM

## 2021-11-03 MED ORDER — DICLOFENAC SODIUM 1 % EX GEL: 4.0000 g | Freq: Four times a day (QID) | CUTANEOUS | 0 refills | Status: DC

## 2021-11-03 MED ORDER — MUPIROCIN 2 % EX OINT: 1.0000 | TOPICAL_OINTMENT | Freq: Two times a day (BID) | CUTANEOUS | 0 refills | Status: DC

## 2021-11-03 NOTE — Progress Notes (Signed)
Subjective:    Patient ID: [name], female    DOB: November 08, 1985, 36 y.o.   MRN: [id number]  Chief Complaint  Patient presents with   Annual Exam    Pt in for annual CPE; labs in 01/2021 in Epic; pt has been having some right shoulder pain and thought maybe it was rotator cuff related, also bug bite on right forearm not healing with OTC meds. Pt is not fasting for labs today; pap completed in 2022 per patient and requested results to abstract from Dr Elgie Collard.     HPI Patient is in today for annual exam.  Dr. Earlene Plater - weight management GYN - Dr. Cherly Hensen  Acute concerns: R shoulder pain going on last few weeks, started after moving some boxes, but no known injury.  R forearm small red "bug bite" not improving much with OTC cream.   Health maintenance: Lifestyle/ exercise: Minimal Nutrition: Trying to cook more at home Mental health: Stable on Effexor  Caffeine: Soda - Coke Zero  Sleep: Takes Ambien in the evenings, prescribed by Dr. Earlene Plater; negative sleep study in the past  Substance use: None ETOH: None  Sexual activity: Monogamous, no concerns  Immunizations: UTD  Colonoscopy: Low risk, start at 42  Pap: UTD - sees Dr. Cherly Hensen Mammogram: Low-risk, age 69 to start   Past Medical History:  Diagnosis Date   Allergy    Anxiety    Back pain    Constipation    Depression    Dyspnea    Gallbladder problem    GERD (gastroesophageal reflux disease)    IBS (irritable bowel syndrome)    Interstitial cystitis    Lactose intolerance    Migraine    Overactive bladder    Palpitations    PCOS (polycystic ovarian syndrome)    Pelvic pain    Pseudotumor cerebri    Pseudotumor cerebri    SUI (stress urinary incontinence, female)    Urinary incontinence     Past Surgical History:  Procedure Laterality Date   CESAREAN SECTION  2009  &  2011   2011 W/ BILATERAL TUBAL LIGATION   CHOLECYSTECTOMY  2010   CYSTO WITH HYDRODISTENSION  03/01/2012   Procedure:  CYSTOSCOPY/HYDRODISTENSION;  Surgeon: Martina Sinner, MD;  Location: [facility];  Service: Urology;  Laterality: N/A;  INSTILLATION of marcaine and pyridium.   PUBOVAGINAL SLING N/A 02/14/2013   Procedure: PUBO-VAGINAL SLING AND HYDRODISTENSION;  Surgeon: Martina Sinner, MD;  Location: [facility];  Service: Urology;  Laterality: N/A;   TRANSTHORACIC ECHOCARDIOGRAM  06/2009   TUBAL LIGATION      Family History  Problem Relation Age of Onset   Hypertension Mother    Depression Mother    Diabetes Mother    Anxiety disorder Mother    Sleep apnea Mother    Obesity Mother    Hypertension Father    Cancer Father        kidney   Depression Father    Hyperlipidemia Father    Kidney cancer Father    Alcoholism Father    Obesity Father    Hypertension Maternal Grandmother    Cancer Maternal Grandmother        kidney, bone   Hypertension Maternal Grandfather    Cancer Maternal Grandfather        lung, bone   Stroke Maternal Grandfather    Hypertension Paternal Grandmother    Hypertension Paternal Grandfather    Stroke Paternal Grandfather  Cancer Maternal Uncle        stomach    Social History   Tobacco Use   Smoking status: Never   Smokeless tobacco: Never  Vaping Use   Vaping Use: Never used  Substance Use Topics   Alcohol use: No   Drug use: No     No Known Allergies  Review of Systems NEGATIVE UNLESS OTHERWISE INDICATED IN HPI      Objective:     BP 118/76 (BP Location: Left Arm)   Pulse 94   Temp (!) 97.5 F (36.4 C) (Temporal)   Ht 5\' 5"  (1.651 m)   Wt 274 lb (124.3 kg)   SpO2 97%   BMI 45.60 kg/m   Wt Readings from Last 3 Encounters:  11/03/21 274 lb (124.3 kg)  04/16/21 259 lb (117.5 kg)  02/18/21 257 lb (116.6 kg)    BP Readings from Last 3 Encounters:  11/03/21 118/76  04/16/21 118/80  02/18/21 124/83     Physical Exam Vitals and nursing note reviewed.  Constitutional:      Appearance: Normal  appearance. She is obese. She is not toxic-appearing.  HENT:     Head: Normocephalic and atraumatic.     Right Ear: Tympanic membrane, ear canal and external ear normal.     Left Ear: Tympanic membrane, ear canal and external ear normal.     Nose: Nose normal.     Mouth/Throat:     Mouth: Mucous membranes are moist.  Eyes:     Extraocular Movements: Extraocular movements intact.     Conjunctiva/sclera: Conjunctivae normal.     Pupils: Pupils are equal, round, and reactive to light.  Cardiovascular:     Rate and Rhythm: Normal rate and regular rhythm.     Pulses: Normal pulses.     Heart sounds: Normal heart sounds.  Pulmonary:     Effort: Pulmonary effort is normal.     Breath sounds: Normal breath sounds.  Abdominal:     General: Abdomen is flat. Bowel sounds are normal.     Palpations: Abdomen is soft.     Tenderness: There is no right CVA tenderness or left CVA tenderness.  Musculoskeletal:        General: Tenderness (R deltoid, trapezius) present. No swelling or deformity. Normal range of motion.     Cervical back: Normal range of motion and neck supple.     Right lower leg: No edema.     Left lower leg: No edema.  Skin:    General: Skin is warm and dry.     Findings: Lesion (small right forearm circular <0.5 cm erythematous lesion) present.  Neurological:     General: No focal deficit present.     Mental Status: She is alert and oriented to person, place, and time.     Gait: Gait normal.  Psychiatric:        Mood and Affect: Mood normal.        Behavior: Behavior normal.        Thought Content: Thought content normal.        Judgment: Judgment normal.        Assessment & Plan:  Encounter for annual physical exam Assessment & Plan: Age-appropriate screening and counseling performed today. Will check labs and call with results. Preventive measures discussed and printed in AVS for patient.   Patient Counseling: [x]   Nutrition: Stressed importance of moderation in  sodium/caffeine intake, saturated fat and cholesterol, caloric balance, sufficient intake of fresh fruits, vegetables,  and fiber.  [x]   Stressed the importance of regular exercise.   []   Substance Abuse: Discussed cessation/primary prevention of tobacco, alcohol, or other drug use; driving or other dangerous activities under the influence; availability of treatment for abuse.   []   Injury prevention: Discussed safety belts, safety helmets, smoke detector, smoking near bedding or upholstery.   []   Sexuality: Discussed sexually transmitted diseases, partner selection, use of condoms, avoidance of unintended pregnancy  and contraceptive alternatives.   [x]   Dental health: Discussed importance of regular tooth brushing, flossing, and dental visits.  [x]   Health maintenance and immunizations reviewed. Please refer to Health maintenance section.       Pre-diabetes  Vitamin D deficiency  Insulin resistance  Class 3 severe obesity due to excess calories with serious comorbidity and body mass index (BMI) of 45.0 to 49.9 in adult [facility])  Right shoulder tendinitis Assessment & Plan: Encourage patient to try Voltaren gel at this time.  She may need to consider physical therapy if worse or no improvement.   Insect bite of right arm, initial encounter Assessment & Plan: Use Bactroban ointment as directed.  May use over-the-counter hydrocortisone cream if any symptoms of itching.   Other orders -     Mupirocin; Apply 1 Application topically 2 (two) times daily.  Dispense: 22 g; Refill: 0 -     Diclofenac Sodium; Apply 4 g topically 4 (four) times daily.  Dispense: 100 g; Refill: 0        Return in about 1 year (around 11/04/2022) for CPE, fasting labs .    Antrone Walla M Suad Autrey, PA-C

## 2021-11-08 DIAGNOSIS — M778 Other enthesopathies, not elsewhere classified: Secondary | ICD-10-CM | POA: Insufficient documentation

## 2021-11-08 DIAGNOSIS — S40861A Insect bite (nonvenomous) of right upper arm, initial encounter: Secondary | ICD-10-CM | POA: Insufficient documentation

## 2021-11-08 DIAGNOSIS — M7581 Other shoulder lesions, right shoulder: Secondary | ICD-10-CM | POA: Insufficient documentation

## 2021-11-08 NOTE — Assessment & Plan Note (Signed)
Encourage patient to try Voltaren gel at this time.  She may need to consider physical therapy if worse or no improvement.

## 2021-11-08 NOTE — Assessment & Plan Note (Signed)
Use Bactroban ointment as directed.  May use over-the-counter hydrocortisone cream if any symptoms of itching.

## 2021-11-08 NOTE — Assessment & Plan Note (Signed)
Age-appropriate screening and counseling performed today. Will check labs and call with results. Preventive measures discussed and printed in AVS for patient.   Patient Counseling: [x]  Nutrition: Stressed importance of moderation in sodium/caffeine intake, saturated fat and cholesterol, caloric balance, sufficient intake of fresh fruits, vegetables, and fiber.  [x]  Stressed the importance of regular exercise.   []  Substance Abuse: Discussed cessation/primary prevention of tobacco, alcohol, or other drug use; driving or other dangerous activities under the influence; availability of treatment for abuse.   []  Injury prevention: Discussed safety belts, safety helmets, smoke detector, smoking near bedding or upholstery.   []  Sexuality: Discussed sexually transmitted diseases, partner selection, use of condoms, avoidance of unintended pregnancy  and contraceptive alternatives.   [x]  Dental health: Discussed importance of regular tooth brushing, flossing, and dental visits.  [x]  Health maintenance and immunizations reviewed. Please refer to Health maintenance section.     

## 2021-11-17 ENCOUNTER — Encounter: Payer: Self-pay | Admitting: Physician Assistant

## 2021-11-26 DIAGNOSIS — E282 Polycystic ovarian syndrome: Secondary | ICD-10-CM | POA: Diagnosis not present

## 2021-11-26 DIAGNOSIS — K5909 Other constipation: Secondary | ICD-10-CM | POA: Diagnosis not present

## 2021-11-26 DIAGNOSIS — R632 Polyphagia: Secondary | ICD-10-CM | POA: Diagnosis not present

## 2021-12-12 ENCOUNTER — Ambulatory Visit
Admission: RE | Admit: 2021-12-12 | Discharge: 2021-12-12 | Disposition: A | Discharge status: Home / Self Care | Payer: BC Managed Care – PPO | Source: Ambulatory Visit | Attending: Emergency Medicine | Admitting: Emergency Medicine

## 2021-12-12 ENCOUNTER — Ambulatory Visit (INDEPENDENT_AMBULATORY_CARE_PROVIDER_SITE_OTHER): Payer: BC Managed Care – PPO

## 2021-12-12 VITALS — BP 125/80 | HR 103 | Temp 98.8°F | Resp 16

## 2021-12-12 DIAGNOSIS — B9789 Other viral agents as the cause of diseases classified elsewhere: Secondary | ICD-10-CM | POA: Diagnosis not present

## 2021-12-12 DIAGNOSIS — J988 Other specified respiratory disorders: Secondary | ICD-10-CM | POA: Diagnosis not present

## 2021-12-12 DIAGNOSIS — Z1152 Encounter for screening for COVID-19: Secondary | ICD-10-CM | POA: Diagnosis not present

## 2021-12-12 DIAGNOSIS — R079 Chest pain, unspecified: Secondary | ICD-10-CM | POA: Diagnosis not present

## 2021-12-12 LAB — RESP PANEL BY RT-PCR (FLU A&B, COVID) ARPGX2
Influenza A by PCR: NEGATIVE
Influenza B by PCR: NEGATIVE
SARS Coronavirus 2 by RT PCR: NEGATIVE

## 2021-12-12 MED ORDER — IPRATROPIUM BROMIDE 0.06 % NA SOLN: 2.0000 | Freq: Three times a day (TID) | NASAL | 1 refills | Status: DC

## 2021-12-12 MED ORDER — PROMETHAZINE-DM 6.25-15 MG/5ML PO SYRP: 5.0000 mL | ORAL_SOLUTION | Freq: Four times a day (QID) | ORAL | 0 refills | Status: DC | PRN

## 2021-12-12 MED ORDER — ALBUTEROL SULFATE HFA 108 (90 BASE) MCG/ACT IN AERS: 2.0000 | INHALATION_SPRAY | Freq: Four times a day (QID) | RESPIRATORY_TRACT | 0 refills | Status: DC | PRN

## 2021-12-12 MED ORDER — GUAIFENESIN 400 MG PO TABS: ORAL_TABLET | ORAL | 0 refills | Status: DC

## 2021-12-12 NOTE — ED Provider Notes (Signed)
[facility]    CSN: [id number] Arrival date & time: 12/12/21  1401    HISTORY   Chief Complaint  Patient presents with   Cough    Home COVID test negative - Entered by patient   HPI [name] is a pleasant, 36 y.o. female who presents to urgent care today. Patient complains of a cough and congestion that she feels in her back.  Patient states her symptoms began 4 days ago.  Patient states she took a COVID test at home today which was negative.  Patient states has been taking over-the-counter cough medications with no relief.  Patient has normal vital signs with the exception of an elevated heart rate likely secondary to over-the-counter cough medicines.  Patient denies headache, sore throat, body aches, chills, fever, nausea, vomiting, diarrhea, known sick contacts.  The history is provided by the patient.   Past Medical History:  Diagnosis Date   Allergy    Anxiety    Back pain    Constipation    Depression    Dyspnea    Gallbladder problem    GERD (gastroesophageal reflux disease)    IBS (irritable bowel syndrome)    Interstitial cystitis    Lactose intolerance    Migraine    Overactive bladder    Palpitations    PCOS (polycystic ovarian syndrome)    Pelvic pain    Pseudotumor cerebri    Pseudotumor cerebri    SUI (stress urinary incontinence, female)    Urinary incontinence    Patient Active Problem List   Diagnosis Date Noted   Right shoulder tendinitis 11/08/2021   Insect bite of right arm, initial encounter 11/08/2021   Migraine with aura and without status migrainosus, not intractable 03/19/2021   Pre-diabetes 06/12/2020   Vitamin D deficiency 06/12/2020   Insulin resistance 04/18/2019   Class 3 severe obesity with serious comorbidity and body mass index (BMI) of 45.0 to 49.9 in adult [facility]) 04/18/2019   Elevated TSH 07/19/2017   Fatigue 07/19/2017   Encounter for annual physical exam 05/05/2016   Depression 01/18/2016   Insomnia  01/18/2016   Chronic migraine 01/18/2016   CARDIOMEGALY 06/13/2009   Past Surgical History:  Procedure Laterality Date   CESAREAN SECTION  2009  &  2011   2011 W/ BILATERAL TUBAL LIGATION   CHOLECYSTECTOMY  2010   CYSTO WITH HYDRODISTENSION  03/01/2012   Procedure: CYSTOSCOPY/HYDRODISTENSION;  Surgeon: Martina Sinner, MD;  Location: [facility];  Service: Urology;  Laterality: N/A;  INSTILLATION of marcaine and pyridium.   PUBOVAGINAL SLING N/A 02/14/2013   Procedure: PUBO-VAGINAL SLING AND HYDRODISTENSION;  Surgeon: Martina Sinner, MD;  Location: [facility];  Service: Urology;  Laterality: N/A;   TRANSTHORACIC ECHOCARDIOGRAM  06/2009   TUBAL LIGATION     OB History     Gravida  3   Para      Term      Preterm      AB  1   Living  2      SAB  1   IAB      Ectopic      Multiple      Live Births             Home Medications    Prior to Admission medications   Medication Sig Start Date End Date Taking? Authorizing Provider  cetirizine (ZYRTEC) 10 MG tablet Take 10 mg by mouth daily.    [provider]  Cholecalciferol (VITAMIN D) 125 MCG (5000 UT) CAPS Take 1 capsule by mouth daily. 06/11/20   Whitmire, Thermon Leylandawn W, FNP  diclofenac Sodium (VOLTAREN ARTHRITIS PAIN) 1 % GEL Apply 4 g topically 4 (four) times daily. 11/03/21   Allwardt, Alyssa M, PA-C  fluticasone (FLONASE) 50 MCG/ACT nasal spray Place 2 sprays into both nostrils daily. 04/20/18   Jannifer RodneyHawks, Christy A, FNP  Melatonin 10 MG TABS Take 10 mg by mouth at bedtime.     [provider]  Multiple Vitamin (MULTIVITAMIN) tablet Take 1 tablet by mouth daily.    [provider]  mupirocin ointment (BACTROBAN) 2 % Apply 1 Application topically 2 (two) times daily. 11/03/21   Allwardt, Alyssa M, PA-C  OZEMPIC, 2 MG/DOSE, 8 MG/3ML SOPN Inject 1 mg into the skin once a week. 10/17/21   [provider]  phentermine (ADIPEX-P) 37.5 MG tablet Take 1 tablet  (37.5 mg total) by mouth daily. 04/16/21   Helane RimaWallace, Erica, DO  spironolactone (ALDACTONE) 100 MG tablet Take 100 mg by mouth daily. 12/03/15   [provider]  SPRINTEC 28 0.25-35 MG-MCG tablet Take 1 tablet by mouth daily. 12/01/15   [provider]  topiramate (TOPAMAX) 50 MG tablet Take 1 tablet (50 mg total) by mouth at bedtime. 05/19/21   Quillian QuinceBeasley, Caren D, MD  venlafaxine XR (EFFEXOR-XR) 150 MG 24 hr capsule TAKE 1 CAPSULE(150 MG) BY MOUTH DAILY WITH BREAKFAST 10/16/21   Allwardt, Alyssa M, PA-C  zolpidem (AMBIEN) 5 MG tablet Take 1 tablet (5 mg total) by mouth at bedtime. 04/16/21   Helane RimaWallace, Erica, DO    Family History Family History  Problem Relation Age of Onset   Hypertension Mother    Depression Mother    Diabetes Mother    Anxiety disorder Mother    Sleep apnea Mother    Obesity Mother    Hypertension Father    Cancer Father        kidney   Depression Father    Hyperlipidemia Father    Kidney cancer Father    Alcoholism Father    Obesity Father    Hypertension Maternal Grandmother    Cancer Maternal Grandmother        kidney, bone   Hypertension Maternal Grandfather    Cancer Maternal Grandfather        lung, bone   Stroke Maternal Grandfather    Hypertension Paternal Grandmother    Hypertension Paternal Grandfather    Stroke Paternal Grandfather    Cancer Maternal Uncle        stomach   Social History Social History   Tobacco Use   Smoking status: Never   Smokeless tobacco: Never  Vaping Use   Vaping Use: Never used  Substance Use Topics   Alcohol use: No   Drug use: No   Allergies   Patient has no known allergies.  Review of Systems Review of Systems Pertinent findings revealed after performing a 14 point review of systems has been noted in the history of present illness.  Physical Exam Triage Vital Signs ED Triage Vitals  Enc Vitals Group     BP 11/22/20 0827 (!) 147/82     Pulse Rate 11/22/20 0827 72     Resp 11/22/20 0827 18      Temp 11/22/20 0827 98.3 F (36.8 C)     Temp Source 11/22/20 0827 Oral     SpO2 11/22/20 0827 98 %     Weight --      Height --  Head Circumference --      Peak Flow --      Pain Score 11/22/20 0826 5     Pain Loc --      Pain Edu? --      Excl. in GC? --   No data found.  Updated Vital Signs BP 125/80 (BP Location: Right Arm)   Pulse (!) 103   Temp 98.8 F (37.1 C) (Oral)   Resp 16   SpO2 96%   Physical Exam Vitals and nursing note reviewed.  Constitutional:      General: She is not in acute distress.    Appearance: Normal appearance. She is not ill-appearing.  HENT:     Head: Normocephalic and atraumatic.     Salivary Glands: Right salivary gland is not diffusely enlarged or tender. Left salivary gland is not diffusely enlarged or tender.     Right Ear: Hearing, tympanic membrane, ear canal and external ear normal. No drainage. No middle ear effusion. There is no impacted cerumen. Tympanic membrane is not erythematous or bulging.     Left Ear: Hearing and external ear normal. No drainage.  No middle ear effusion. There is no impacted cerumen. Tympanic membrane is erythematous and bulging.     Ears:     Comments: Left EAC with erythema    Nose: Rhinorrhea present. No nasal deformity, septal deviation, mucosal edema or congestion. Rhinorrhea is clear.     Right Turbinates: Not enlarged, swollen or pale.     Left Turbinates: Not enlarged, swollen or pale.     Right Sinus: No maxillary sinus tenderness or frontal sinus tenderness.     Left Sinus: No maxillary sinus tenderness or frontal sinus tenderness.     Mouth/Throat:     Lips: Pink. No lesions.     Mouth: Mucous membranes are moist. No oral lesions.     Pharynx: Uvula midline. Pharyngeal swelling and posterior oropharyngeal erythema present. No oropharyngeal exudate or uvula swelling.     Tonsils: No tonsillar exudate. 2+ on the right. 2+ on the left.  Eyes:     General: Lids are normal.        Right eye: No  discharge.        Left eye: No discharge.     Extraocular Movements: Extraocular movements intact.     Conjunctiva/sclera: Conjunctivae normal.     Right eye: Right conjunctiva is not injected.     Left eye: Left conjunctiva is not injected.  Neck:     Trachea: Trachea and phonation normal.  Cardiovascular:     Rate and Rhythm: Normal rate and regular rhythm.     Pulses: Normal pulses.     Heart sounds: Normal heart sounds. No murmur heard.    No friction rub. No gallop.  Pulmonary:     Effort: Pulmonary effort is normal. Prolonged expiration present. No tachypnea, bradypnea, accessory muscle usage, respiratory distress or retractions.     Breath sounds: No stridor, decreased air movement or transmitted upper airway sounds. Examination of the right-middle field reveals decreased breath sounds. Examination of the left-middle field reveals decreased breath sounds. Examination of the right-lower field reveals decreased breath sounds. Decreased breath sounds present. No wheezing, rhonchi or rales.  Chest:     Chest wall: No tenderness.  Musculoskeletal:        General: Normal range of motion.     Cervical back: Full passive range of motion without pain, normal range of motion and neck supple. Normal range of motion.  Lymphadenopathy:     Cervical: Cervical adenopathy present.     Right cervical: Superficial cervical adenopathy and posterior cervical adenopathy present.     Left cervical: Superficial cervical adenopathy and posterior cervical adenopathy present.  Skin:    General: Skin is warm and dry.     Findings: No erythema or rash.  Neurological:     General: No focal deficit present.     Mental Status: She is alert and oriented to person, place, and time.  Psychiatric:        Mood and Affect: Mood normal.        Behavior: Behavior normal.     Visual Acuity Right Eye Distance:   Left Eye Distance:   Bilateral Distance:    Right Eye Near:   Left Eye Near:    Bilateral  Near:     [facility] / Diagnostics / Procedures:     Radiology DG Chest 2 View  Result Date: 12/12/2021 CLINICAL DATA:  Pleuritic chest pain EXAM: CHEST - 2 VIEW COMPARISON:  None Available. FINDINGS: Normal mediastinum and cardiac silhouette. Normal pulmonary vasculature. No evidence of effusion, infiltrate, or pneumothorax. No acute bony abnormality. IMPRESSION: No acute cardiopulmonary process. Electronically Signed   By: Genevive Bi M.D.   On: 12/12/2021 15:23    Procedures Procedures (including critical care time) EKG  Pending results:  Labs Reviewed - No data to display  Medications Ordered in UC: Medications - No data to display  UC Diagnoses / Final Clinical Impressions(s)   I have reviewed the triage vital signs and the nursing notes.  Pertinent labs & imaging results that were available during my care of the patient were reviewed by me and considered in my medical decision making (see chart for details).    Final diagnoses:  Viral respiratory illness   Patient advised of chest x-ray findings.  COVID-19 and influenza PCR testing performed, will notify patient of results once received, patient would not benefit from Tamiflu or Paxlovid at this time if positive.  Supportive medications prescribed.  Conservative care recommended.  Return precautions advised.  ED Prescriptions     Medication Sig Dispense Auth. Provider   promethazine-dextromethorphan (PROMETHAZINE-DM) 6.25-15 MG/5ML syrup Take 5 mLs by mouth 4 (four) times daily as needed for cough. 118 mL Theadora Rama Scales, PA-C   guaifenesin (HUMIBID E) 400 MG TABS tablet Take 1 tablet 3 times daily as needed for chest congestion and cough 21 tablet Theadora Rama Scales, PA-C   ipratropium (ATROVENT) 0.06 % nasal spray Place 2 sprays into both nostrils 3 (three) times daily. As needed for nasal congestion, runny nose 15 mL Theadora Rama Scales, PA-C   albuterol (VENTOLIN HFA) 108 (90 Base) MCG/ACT inhaler  Inhale 2 puffs into the lungs every 6 (six) hours as needed for wheezing or shortness of breath (Cough). 18 g Theadora Rama Scales, PA-C      PDMP not reviewed this encounter.  Disposition Upon Discharge:  Condition: stable for discharge home Home: take medications as prescribed; routine discharge instructions as discussed; follow up as advised.  Patient presented with an acute illness with associated systemic symptoms and significant discomfort requiring urgent management. In my opinion, this is a condition that a prudent lay person (someone who possesses an average knowledge of health and medicine) may potentially expect to result in complications if not addressed urgently such as respiratory distress, impairment of bodily function or dysfunction of bodily organs.   Routine symptom specific, illness specific and/or disease specific instructions were  discussed with the patient and/or caregiver at length.   As such, the patient has been evaluated and assessed, work-up was performed and treatment was provided in alignment with urgent care protocols and evidence based medicine.  Patient/parent/caregiver has been advised that the patient may require follow up for further testing and treatment if the symptoms continue in spite of treatment, as clinically indicated and appropriate.  If the patient was tested for COVID-19, Influenza and/or RSV, then the patient/parent/guardian was advised to isolate at home pending the results of his/her diagnostic coronavirus test and potentially longer if they're positive. I have also advised pt that if his/her COVID-19 test returns positive, it's recommended to self-isolate for at least 10 days after symptoms first appeared AND until fever-free for 24 hours without fever reducer AND other symptoms have improved or resolved. Discussed self-isolation recommendations as well as instructions for household member/close contacts as per the [facility] and [place] [facility], and also gave  patient the COVID packet with this information.  Patient/parent/caregiver has been advised to return to the [facility] or PCP in 3-5 days if no better; to PCP or the Emergency Department if new signs and symptoms develop, or if the current signs or symptoms continue to change or worsen for further workup, evaluation and treatment as clinically indicated and appropriate  The patient will follow up with their current PCP if and as advised. If the patient does not currently have a PCP we will assist them in obtaining one.   The patient may need specialty follow up if the symptoms continue, in spite of conservative treatment and management, for further workup, evaluation, consultation and treatment as clinically indicated and appropriate.  Patient/parent/caregiver verbalized understanding and agreement of plan as discussed.  All questions were addressed during visit.  Please see discharge instructions below for further details of plan.  Discharge Instructions:   Discharge Instructions      Your chest x-ray today is normal, there is no concern for pneumonia, atelectasis, bronchitis or pleural effusion.  The that the severe aches you are having in your back are related to viral illness.  For this reason, you received a COVID-19 and influenza PCR test today.  The results of your PCR testing will be posted to your MyChart once it is complete.  This typically takes 6 to 12 hours.    If your influenza PCR test is positive, you will be contacted by phone.  Due to the current duration of your symptoms, you will no longer benefit from antiviral therapy for influenza.       If your COVID-19 PCR test is positive, you will be contacted by phone.  Because you do not have a history of being immune compromised, you are currently vaccinated for COVID-19, you are under the age of 41, you do not have a risk of severe disease due to COVID-19, antiviral treatment is not indicated.    If both your COVID-19 tests are  negative and your influenza test is negative, then you can safely assume that your illness is due to one of the many less serious illnesses circulating in our community right now.  Conservative care is recommended with rest, drinking plenty of clear fluids, eating only when hungry, taking supportive medications for your symptoms and avoiding being around other people.  Please remain at home until you are fever free for 24 hours without the use of antifever medications such as Tylenol and ibuprofen.    Please read below to learn more about the medications, dosages and  frequencies that I recommend to help alleviate your symptoms and to get you feeling better soon:  Atrovent (ipratropium): This is an excellent nasal decongestant spray that will not cause rebound congestion, please instill 2 sprays into each nare with each use.  Please use it up to 3 times daily as needed, 2 sprays into each nare with each use.  I have provided you with a prescription for this medication.      ProAir, Ventolin, Proventil (albuterol): This inhaled medication contains a short acting beta agonist bronchodilator.  This medication works on the smooth muscle that opens and constricts of your airways by relaxing the muscle.  The result of relaxation of the smooth muscle is increased air movement and improved work of breathing.  This is a short acting medication that can be used every 4-6 hours as needed for increased work of breathing, shortness of breath, wheezing and excessive coughing.  I have provided you with a prescription.    Advil, Motrin (ibuprofen): This is a good anti-inflammatory medication which not only addresses aches, pains but also significantly reduces soft tissue inflammation of the upper airways that causes sinus and nasal congestion as well as inflammation of the lower airways which makes you feel like your breathing is constricted or your cough feel tight.  I recommend that you take 400 mg every 8 hours as needed.       Robitussin, Mucinex (guaifenesin): This is an expectorant.  This helps break up chest congestion and loosen up thick nasal drainage making phlegm and drainage more liquid and therefore easier to remove.  I recommend being 400 mg three times daily as needed.      Promethazine DM: Promethazine is both a nasal decongestant and an antinausea medication that makes most patients feel fairly sleepy.  The DM is dextromethorphan, a cough suppressant found in many over-the-counter cough medications.  Please take 5 mL before bedtime to minimize your cough which will help you sleep better.  I have sent a prescription for this medication to your pharmacy.   Please follow-up within the next 5-7 days either with your primary care provider or urgent care if your symptoms do not resolve.  If you do not have a primary care provider, we will assist you in finding one.        Thank you for visiting urgent care today.  We appreciate the opportunity to participate in your care.               This office note has been dictated using Teaching laboratory technician.  Unfortunately, this method of dictation can sometimes lead to typographical or grammatical errors.  I apologize for your inconvenience in advance if this occurs.  Please do not hesitate to reach out to me if clarification is needed.      Theadora Rama Scales, PA-C 12/12/21 1537

## 2021-12-12 NOTE — Discharge Instructions (Addendum)
Your chest x-ray today is normal, there is no concern for pneumonia, atelectasis, bronchitis or pleural effusion.  The that the severe aches you are having in your back are related to viral illness.  For this reason, you received a COVID-19 and influenza PCR test today.  The results of your PCR testing will be posted to your MyChart once it is complete.  This typically takes 6 to 12 hours.    If your influenza PCR test is positive, you will be contacted by phone.  Due to the current duration of your symptoms, you will no longer benefit from antiviral therapy for influenza.       If your COVID-19 PCR test is positive, you will be contacted by phone.  Because you do not have a history of being immune compromised, you are currently vaccinated for COVID-19, you are under the age of 74, you do not have a risk of severe disease due to COVID-19, antiviral treatment is not indicated.    If both your COVID-19 tests are negative and your influenza test is negative, then you can safely assume that your illness is due to one of the many less serious illnesses circulating in our community right now.  Conservative care is recommended with rest, drinking plenty of clear fluids, eating only when hungry, taking supportive medications for your symptoms and avoiding being around other people.  Please remain at home until you are fever free for 24 hours without the use of antifever medications such as Tylenol and ibuprofen.    Please read below to learn more about the medications, dosages and frequencies that I recommend to help alleviate your symptoms and to get you feeling better soon:  Atrovent (ipratropium): This is an excellent nasal decongestant spray that will not cause rebound congestion, please instill 2 sprays into each nare with each use.  Please use it up to 3 times daily as needed, 2 sprays into each nare with each use.  I have provided you with a prescription for this medication.      ProAir, Ventolin,  Proventil (albuterol): This inhaled medication contains a short acting beta agonist bronchodilator.  This medication works on the smooth muscle that opens and constricts of your airways by relaxing the muscle.  The result of relaxation of the smooth muscle is increased air movement and improved work of breathing.  This is a short acting medication that can be used every 4-6 hours as needed for increased work of breathing, shortness of breath, wheezing and excessive coughing.  I have provided you with a prescription.    Advil, Motrin (ibuprofen): This is a good anti-inflammatory medication which not only addresses aches, pains but also significantly reduces soft tissue inflammation of the upper airways that causes sinus and nasal congestion as well as inflammation of the lower airways which makes you feel like your breathing is constricted or your cough feel tight.  I recommend that you take 400 mg every 8 hours as needed.      Robitussin, Mucinex (guaifenesin): This is an expectorant.  This helps break up chest congestion and loosen up thick nasal drainage making phlegm and drainage more liquid and therefore easier to remove.  I recommend being 400 mg three times daily as needed.      Promethazine DM: Promethazine is both a nasal decongestant and an antinausea medication that makes most patients feel fairly sleepy.  The DM is dextromethorphan, a cough suppressant found in many over-the-counter cough medications.  Please take 5 mL before  bedtime to minimize your cough which will help you sleep better.  I have sent a prescription for this medication to your pharmacy.   Please follow-up within the next 5-7 days either with your primary care provider or urgent care if your symptoms do not resolve.  If you do not have a primary care provider, we will assist you in finding one.        Thank you for visiting urgent care today.  We appreciate the opportunity to participate in your care.

## 2021-12-12 NOTE — ED Triage Notes (Signed)
Pt reports having cough, and congestion that is also felt in her back.   Started: Tuesday   Home interventions: OTC cough meds  Rapid covid test was negative today.

## 2022-01-07 DIAGNOSIS — E559 Vitamin D deficiency, unspecified: Secondary | ICD-10-CM | POA: Diagnosis not present

## 2022-01-07 DIAGNOSIS — Z6841 Body Mass Index (BMI) 40.0 and over, adult: Secondary | ICD-10-CM | POA: Diagnosis not present

## 2022-01-07 DIAGNOSIS — Z13220 Encounter for screening for lipoid disorders: Secondary | ICD-10-CM | POA: Diagnosis not present

## 2022-01-07 DIAGNOSIS — R632 Polyphagia: Secondary | ICD-10-CM | POA: Diagnosis not present

## 2022-01-07 DIAGNOSIS — Z131 Encounter for screening for diabetes mellitus: Secondary | ICD-10-CM | POA: Diagnosis not present

## 2022-01-07 DIAGNOSIS — E282 Polycystic ovarian syndrome: Secondary | ICD-10-CM | POA: Diagnosis not present

## 2022-01-16 ENCOUNTER — Ambulatory Visit
Admission: EM | Admit: 2022-01-16 | Discharge: 2022-01-16 | Disposition: A | Discharge status: Home / Self Care | Payer: BC Managed Care – PPO | Attending: Urgent Care | Admitting: Urgent Care

## 2022-01-16 DIAGNOSIS — Z20828 Contact with and (suspected) exposure to other viral communicable diseases: Secondary | ICD-10-CM

## 2022-01-16 DIAGNOSIS — R053 Chronic cough: Secondary | ICD-10-CM | POA: Diagnosis not present

## 2022-01-16 DIAGNOSIS — B349 Viral infection, unspecified: Secondary | ICD-10-CM | POA: Diagnosis not present

## 2022-01-16 DIAGNOSIS — Z1152 Encounter for screening for COVID-19: Secondary | ICD-10-CM | POA: Insufficient documentation

## 2022-01-16 DIAGNOSIS — J4530 Mild persistent asthma, uncomplicated: Secondary | ICD-10-CM

## 2022-01-16 DIAGNOSIS — J309 Allergic rhinitis, unspecified: Secondary | ICD-10-CM | POA: Diagnosis not present

## 2022-01-16 MED ORDER — PREDNISONE 50 MG PO TABS: 50.0000 mg | ORAL_TABLET | Freq: Every day | ORAL | 0 refills | Status: DC

## 2022-01-16 MED ORDER — BENZONATATE 100 MG PO CAPS: 100.0000 mg | ORAL_CAPSULE | Freq: Three times a day (TID) | ORAL | 0 refills | Status: DC | PRN

## 2022-01-16 MED ORDER — PAXLOVID (300/100) 20 X 150 MG & 10 X 100MG PO TBPK: ORAL_TABLET | ORAL | 0 refills | Status: DC

## 2022-01-16 NOTE — ED Provider Notes (Signed)
[facility]  Note:  This document was prepared using Conservation officer, historic buildings and may include unintentional dictation errors.  MRN: [id number] DOB: Jun 12, 1985  Subjective:   [name] is a 36 y.o. female presenting for 5-day history of acute onset persistent productive cough, low-grade fever, sinus pressure and congestion.  Has had some ear pain as well.  No overt chest pain, shortness of breath or wheezing.  No history of respiratory disorders.  Patient is not a smoker.  She has had significant exposure to influenza.  However, she has not experienced high fevers or severe body pains.  No current facility-administered medications for this encounter.  Current Outpatient Medications:    albuterol (VENTOLIN HFA) 108 (90 Base) MCG/ACT inhaler, Inhale 2 puffs into the lungs every 6 (six) hours as needed for wheezing or shortness of breath (Cough)., Disp: 18 g, Rfl: 0   cetirizine (ZYRTEC) 10 MG tablet, Take 10 mg by mouth daily., Disp: , Rfl:    Cholecalciferol (VITAMIN D) 125 MCG (5000 UT) CAPS, Take 1 capsule by mouth daily., Disp: 30 capsule, Rfl: 0   diclofenac Sodium (VOLTAREN ARTHRITIS PAIN) 1 % GEL, Apply 4 g topically 4 (four) times daily., Disp: 100 g, Rfl: 0   fluticasone (FLONASE) 50 MCG/ACT nasal spray, Place 2 sprays into both nostrils daily., Disp: 16 g, Rfl: 6   guaifenesin (HUMIBID E) 400 MG TABS tablet, Take 1 tablet 3 times daily as needed for chest congestion and cough, Disp: 21 tablet, Rfl: 0   ipratropium (ATROVENT) 0.06 % nasal spray, Place 2 sprays into both nostrils 3 (three) times daily. As needed for nasal congestion, runny nose, Disp: 15 mL, Rfl: 1   Melatonin 10 MG TABS, Take 10 mg by mouth at bedtime. , Disp: , Rfl:    Multiple Vitamin (MULTIVITAMIN) tablet, Take 1 tablet by mouth daily., Disp: , Rfl:    mupirocin ointment (BACTROBAN) 2 %, Apply 1 Application topically 2 (two) times daily., Disp: 22 g, Rfl: 0   OZEMPIC, 2  MG/DOSE, 8 MG/3ML SOPN, Inject 1 mg into the skin once a week., Disp: , Rfl:    phentermine (ADIPEX-P) 37.5 MG tablet, Take 1 tablet (37.5 mg total) by mouth daily., Disp: 30 tablet, Rfl: 0   promethazine-dextromethorphan (PROMETHAZINE-DM) 6.25-15 MG/5ML syrup, Take 5 mLs by mouth 4 (four) times daily as needed for cough., Disp: 118 mL, Rfl: 0   spironolactone (ALDACTONE) 100 MG tablet, Take 100 mg by mouth daily., Disp: , Rfl: 4   SPRINTEC 28 0.25-35 MG-MCG tablet, Take 1 tablet by mouth daily., Disp: , Rfl: 4   topiramate (TOPAMAX) 50 MG tablet, Take 1 tablet (50 mg total) by mouth at bedtime., Disp: 30 tablet, Rfl: 0   venlafaxine XR (EFFEXOR-XR) 150 MG 24 hr capsule, TAKE 1 CAPSULE(150 MG) BY MOUTH DAILY WITH BREAKFAST, Disp: 90 capsule, Rfl: 1   zolpidem (AMBIEN) 5 MG tablet, Take 1 tablet (5 mg total) by mouth at bedtime., Disp: 30 tablet, Rfl: 2   No Known Allergies  Past Medical History:  Diagnosis Date   Allergy    Anxiety    Back pain    Constipation    Depression    Dyspnea    Gallbladder problem    GERD (gastroesophageal reflux disease)    IBS (irritable bowel syndrome)    Interstitial cystitis    Lactose intolerance    Migraine    Overactive bladder    Palpitations    PCOS (polycystic ovarian  syndrome)    Pelvic pain    Pseudotumor cerebri    Pseudotumor cerebri    SUI (stress urinary incontinence, female)    Urinary incontinence      Past Surgical History:  Procedure Laterality Date   CESAREAN SECTION  2009  &  2011   2011 W/ BILATERAL TUBAL LIGATION   CHOLECYSTECTOMY  2010   CYSTO WITH HYDRODISTENSION  03/01/2012   Procedure: CYSTOSCOPY/HYDRODISTENSION;  Surgeon: Martina Sinner, MD;  Location: [facility];  Service: Urology;  Laterality: N/A;  INSTILLATION of marcaine and pyridium.   PUBOVAGINAL SLING N/A 02/14/2013   Procedure: PUBO-VAGINAL SLING AND HYDRODISTENSION;  Surgeon: Martina Sinner, MD;  Location: [facility];   Service: Urology;  Laterality: N/A;   TRANSTHORACIC ECHOCARDIOGRAM  06/2009   TUBAL LIGATION      Family History  Problem Relation Age of Onset   Hypertension Mother    Depression Mother    Diabetes Mother    Anxiety disorder Mother    Sleep apnea Mother    Obesity Mother    Hypertension Father    Cancer Father        kidney   Depression Father    Hyperlipidemia Father    Kidney cancer Father    Alcoholism Father    Obesity Father    Hypertension Maternal Grandmother    Cancer Maternal Grandmother        kidney, bone   Hypertension Maternal Grandfather    Cancer Maternal Grandfather        lung, bone   Stroke Maternal Grandfather    Hypertension Paternal Grandmother    Hypertension Paternal Grandfather    Stroke Paternal Grandfather    Cancer Maternal Uncle        stomach    Social History   Tobacco Use   Smoking status: Never   Smokeless tobacco: Never  Vaping Use   Vaping Use: Never used  Substance Use Topics   Alcohol use: No   Drug use: No    ROS   Objective:   Vitals: BP (!) 126/92 (BP Location: Left Arm)   Pulse (!) 106   Temp 98.6 F (37 C) (Oral)   Resp 18   LMP 01/12/2022   SpO2 96%   Physical Exam Constitutional:      General: She is not in acute distress.    Appearance: Normal appearance. She is well-developed and normal weight. She is not ill-appearing, toxic-appearing or diaphoretic.  HENT:     Head: Normocephalic and atraumatic.     Right Ear: Tympanic membrane, ear canal and external ear normal. No drainage or tenderness. No middle ear effusion. There is no impacted cerumen. Tympanic membrane is not erythematous or bulging.     Left Ear: Tympanic membrane, ear canal and external ear normal. No drainage or tenderness.  No middle ear effusion. There is no impacted cerumen. Tympanic membrane is not erythematous or bulging.     Nose: Nose normal. No congestion or rhinorrhea.     Mouth/Throat:     Mouth: Mucous membranes are moist. No  oral lesions.     Pharynx: No pharyngeal swelling, oropharyngeal exudate, posterior oropharyngeal erythema or uvula swelling.     Tonsils: No tonsillar exudate or tonsillar abscesses.  Eyes:     General: No scleral icterus.       Right eye: No discharge.        Left eye: No discharge.     Extraocular Movements: Extraocular movements intact.  Right eye: Normal extraocular motion.     Left eye: Normal extraocular motion.     Conjunctiva/sclera: Conjunctivae normal.  Cardiovascular:     Rate and Rhythm: Normal rate and regular rhythm.     Heart sounds: Normal heart sounds. No murmur heard.    No friction rub. No gallop.  Pulmonary:     Effort: Pulmonary effort is normal. No respiratory distress.     Breath sounds: No stridor. No wheezing, rhonchi or rales.  Chest:     Chest wall: No tenderness.  Musculoskeletal:     Cervical back: Normal range of motion and neck supple.  Lymphadenopathy:     Cervical: No cervical adenopathy.  Skin:    General: Skin is warm and dry.  Neurological:     General: No focal deficit present.     Mental Status: She is alert and oriented to person, place, and time.  Psychiatric:        Mood and Affect: Mood normal.        Behavior: Behavior normal.     Assessment and Plan :   PDMP not reviewed this encounter.  1. Acute viral syndrome   2. Allergic rhinitis, unspecified seasonality, unspecified trigger   3. Mild persistent asthma without complication   4. Exposure to influenza     No overt signs of otitis media, pneumonia.  Low suspicion for bacterial sinusitis.  Suspect an acute viral syndrome.  She is past the timeline that Tamiflu would be helpful.  I did advise that she very well could have COVID given her sick symptoms and tachycardia.  She was agreeable to getting the Paxlovid given the timeline of her illness and not wanting to miss the window to start this.  COVID-19 testing is pending, she was advised to stop this medication if her results  are negative. Deferred imaging given clear cardiopulmonary exam, hemodynamically stable vital signs. Counseled patient on potential for adverse effects with medications prescribed/recommended today, ER and return-to-clinic precautions discussed, patient verbalized understanding.    Wallis Bamberg, [place] 01/17/22 [phone]

## 2022-01-16 NOTE — ED Triage Notes (Signed)
Pt c/o prod cough, low grade fever started 5 days ago-NAD-steady gait

## 2022-01-17 ENCOUNTER — Ambulatory Visit: Payer: Self-pay

## 2022-01-17 LAB — SARS CORONAVIRUS 2 (TAT 6-24 HRS): SARS Coronavirus 2: NEGATIVE

## 2022-01-22 ENCOUNTER — Telehealth: Payer: BC Managed Care – PPO | Admitting: Physician Assistant

## 2022-01-22 DIAGNOSIS — B9689 Other specified bacterial agents as the cause of diseases classified elsewhere: Secondary | ICD-10-CM

## 2022-01-22 DIAGNOSIS — J208 Acute bronchitis due to other specified organisms: Secondary | ICD-10-CM | POA: Diagnosis not present

## 2022-01-22 MED ORDER — DOXYCYCLINE HYCLATE 100 MG PO TABS: 100.0000 mg | ORAL_TABLET | Freq: Two times a day (BID) | ORAL | 0 refills | Status: DC

## 2022-01-22 MED ORDER — PSEUDOEPH-BROMPHEN-DM 30-2-10 MG/5ML PO SYRP: 5.0000 mL | ORAL_SOLUTION | Freq: Four times a day (QID) | ORAL | 0 refills | Status: DC | PRN

## 2022-01-22 NOTE — Progress Notes (Signed)
We are sorry that you are not feeling well.  Here is how we plan to help!  Based on your presentation I believe you most likely have A cough due to bacteria.  When patients have a fever and a productive cough with a change in color or increased sputum production, we are concerned about bacterial bronchitis.  If left untreated it can progress to pneumonia.  If your symptoms do not improve with your treatment plan it is important that you contact your provider.   I have prescribed Doxycycline 100 mg twice a day for 7 days     I have prescribed bromfed-dm to use for cough.   From your responses in the eVisit questionnaire you describe inflammation in the upper respiratory tract which is causing a significant cough.  This is commonly called Bronchitis and has four common causes:   Allergies Viral Infections Acid Reflux Bacterial Infection Allergies, viruses and acid reflux are treated by controlling symptoms or eliminating the cause. An example might be a cough caused by taking certain blood pressure medications. You stop the cough by changing the medication. Another example might be a cough caused by acid reflux. Controlling the reflux helps control the cough.  USE OF BRONCHODILATOR ("RESCUE") INHALERS: There is a risk from using your bronchodilator too frequently.  The risk is that over-reliance on a medication which only relaxes the muscles surrounding the breathing tubes can reduce the effectiveness of medications prescribed to reduce swelling and congestion of the tubes themselves.  Although you feel brief relief from the bronchodilator inhaler, your asthma may actually be worsening with the tubes becoming more swollen and filled with mucus.  This can delay other crucial treatments, such as oral steroid medications. If you need to use a bronchodilator inhaler daily, several times per day, you should discuss this with your provider.  There are probably better treatments that could be used to keep your  asthma under control.     HOME CARE Only take medications as instructed by your medical team. Complete the entire course of an antibiotic. Drink plenty of fluids and get plenty of rest. Avoid close contacts especially the very young and the elderly Cover your mouth if you cough or cough into your sleeve. Always remember to wash your hands A steam or ultrasonic humidifier can help congestion.   GET HELP RIGHT AWAY IF: You develop worsening fever. You become short of breath You cough up blood. Your symptoms persist after you have completed your treatment plan MAKE SURE YOU  Understand these instructions. Will watch your condition. Will get help right away if you are not doing well or get worse.    Thank you for choosing an e-visit.  Your e-visit answers were reviewed by a board certified advanced clinical practitioner to complete your personal care plan. Depending upon the condition, your plan could have included both over the counter or prescription medications.  Please review your pharmacy choice. Make sure the pharmacy is open so you can pick up prescription now. If there is a problem, you may contact your provider through [employer] and have the prescription routed to another pharmacy.  Your safety is important to [place]. If you have drug allergies check your prescription carefully.   For the next 24 hours you can use MyChart to ask questions about today's visit, request a non-urgent call back, or ask for a work or school excuse. You will get an email in the next two days asking about your experience. I hope that  your e-visit has been valuable and will speed your recovery.

## 2022-01-22 NOTE — Progress Notes (Signed)
I have spent 5 minutes in review of e-visit questionnaire, review and updating patient chart, medical decision making and response to patient.   Katalin Colledge Cody Tadao Emig, PA-C    

## 2022-01-23 ENCOUNTER — Ambulatory Visit: Payer: BC Managed Care – PPO | Admitting: Physician Assistant

## 2022-02-02 ENCOUNTER — Ambulatory Visit (INDEPENDENT_AMBULATORY_CARE_PROVIDER_SITE_OTHER): Payer: BC Managed Care – PPO | Admitting: Physician Assistant

## 2022-02-02 ENCOUNTER — Encounter: Payer: Self-pay | Admitting: Physician Assistant

## 2022-02-02 VITALS — HR 111 | Temp 97.7°F | Ht 65.0 in | Wt 281.0 lb

## 2022-02-02 DIAGNOSIS — Z1159 Encounter for screening for other viral diseases: Secondary | ICD-10-CM

## 2022-02-02 DIAGNOSIS — R0981 Nasal congestion: Secondary | ICD-10-CM | POA: Diagnosis not present

## 2022-02-02 DIAGNOSIS — R051 Acute cough: Secondary | ICD-10-CM | POA: Diagnosis not present

## 2022-02-02 DIAGNOSIS — Z111 Encounter for screening for respiratory tuberculosis: Secondary | ICD-10-CM | POA: Diagnosis not present

## 2022-02-02 MED ORDER — METHYLPREDNISOLONE ACETATE 80 MG/ML IJ SUSP
80.0000 mg | Freq: Once | INTRAMUSCULAR | Status: AC
  Administered 2022-02-02: 80 mg via INTRAMUSCULAR

## 2022-02-02 NOTE — Progress Notes (Signed)
Subjective:    Patient ID: [name], female    DOB: 09-16-1985, 37 y.o.   MRN: [id number]  Chief Complaint  Patient presents with   Cough    Pt in office for cough w/ yellow sputum for greater than 11 days; needs health form and labs done for school; has taken round of doxycycline and no improvement using inhaler. Pt claims been sick since 12/18, thinks she has had RSV since daughter was as diagnosed with RSV. URI cause asthmatic flares; Pt need Quantiferon Gold lab and Hep B Surface Antibody Titier lab for school along with form completed.    Patient is in today for concerns about cough & form for school, see notes from CMA as listed above.   Past Medical History:  Diagnosis Date   Allergy    Anxiety    Back pain    Constipation    Depression    Dyspnea    Gallbladder problem    GERD (gastroesophageal reflux disease)    IBS (irritable bowel syndrome)    Interstitial cystitis    Lactose intolerance    Migraine    Overactive bladder    Palpitations    PCOS (polycystic ovarian syndrome)    Pelvic pain    Pseudotumor cerebri    Pseudotumor cerebri    SUI (stress urinary incontinence, female)    Urinary incontinence     Past Surgical History:  Procedure Laterality Date   CESAREAN SECTION  2009  &  2011   2011 W/ BILATERAL TUBAL LIGATION   CHOLECYSTECTOMY  2010   CYSTO WITH HYDRODISTENSION  03/01/2012   Procedure: CYSTOSCOPY/HYDRODISTENSION;  Surgeon: Martina Sinner, MD;  Location: [facility];  Service: Urology;  Laterality: N/A;  INSTILLATION of marcaine and pyridium.   PUBOVAGINAL SLING N/A 02/14/2013   Procedure: PUBO-VAGINAL SLING AND HYDRODISTENSION;  Surgeon: Martina Sinner, MD;  Location: [facility];  Service: Urology;  Laterality: N/A;   TRANSTHORACIC ECHOCARDIOGRAM  06/2009   TUBAL LIGATION      Family History  Problem Relation Age of Onset   Hypertension Mother    Depression Mother    Diabetes Mother    Anxiety  disorder Mother    Sleep apnea Mother    Obesity Mother    Hypertension Father    Cancer Father        kidney   Depression Father    Hyperlipidemia Father    Kidney cancer Father    Alcoholism Father    Obesity Father    Hypertension Maternal Grandmother    Cancer Maternal Grandmother        kidney, bone   Hypertension Maternal Grandfather    Cancer Maternal Grandfather        lung, bone   Stroke Maternal Grandfather    Hypertension Paternal Grandmother    Hypertension Paternal Grandfather    Stroke Paternal Grandfather    Cancer Maternal Uncle        stomach    Social History   Tobacco Use   Smoking status: Never   Smokeless tobacco: Never  Vaping Use   Vaping Use: Never used  Substance Use Topics   Alcohol use: No   Drug use: No     No Known Allergies  Review of Systems  Respiratory:  Positive for cough.    NEGATIVE UNLESS OTHERWISE INDICATED IN HPI      Objective:     Pulse (!) 111   Temp 97.7 F (36.5 C) (  Temporal)   Ht 5\' 5"  (1.651 m)   Wt 281 lb (127.5 kg)   LMP 01/12/2022   SpO2 95%   BMI 46.76 kg/m   Wt Readings from Last 3 Encounters:  02/02/22 281 lb (127.5 kg)  11/03/21 274 lb (124.3 kg)  04/16/21 259 lb (117.5 kg)    BP Readings from Last 3 Encounters:  01/16/22 (!) 126/92  12/12/21 125/80  11/03/21 118/76     Physical Exam Vitals and nursing note reviewed.  Constitutional:      General: She is not in acute distress.    Appearance: Normal appearance. She is not ill-appearing.  HENT:     Head: Normocephalic.     Right Ear: Tympanic membrane, ear canal and external ear normal.     Left Ear: Tympanic membrane, ear canal and external ear normal.     Nose: Congestion present.     Mouth/Throat:     Mouth: Mucous membranes are moist.     Pharynx: No oropharyngeal exudate or posterior oropharyngeal erythema.  Eyes:     Extraocular Movements: Extraocular movements intact.     Conjunctiva/sclera: Conjunctivae normal.     Pupils:  Pupils are equal, round, and reactive to light.  Cardiovascular:     Rate and Rhythm: Normal rate and regular rhythm.     Pulses: Normal pulses.     Heart sounds: Normal heart sounds. No murmur heard. Pulmonary:     Effort: Pulmonary effort is normal. No respiratory distress.     Breath sounds: Normal breath sounds. No wheezing or rhonchi.     Comments: Intermittent coughing Musculoskeletal:     Cervical back: Normal range of motion.  Skin:    General: Skin is warm.  Neurological:     Mental Status: She is alert and oriented to person, place, and time.  Psychiatric:        Mood and Affect: Mood normal.        Behavior: Behavior normal.        Assessment & Plan:  Acute cough -     methylPREDNISolone Acetate  Sinus congestion -     methylPREDNISolone Acetate  Screening-pulmonary TB -     QuantiFERON-TB Gold Plus  Need for hepatitis B screening test -     Hepatitis B surface antibody,qualitative   Post viral cough/ congestion ongoing. Reassured exam normal, lungs CTAB.  She is going to continue conservative care at home including nasal saline, humidifier, Flonase or Astelin nasal spray.  Depo-Medrol 80 mg IM given in office today for added relief.  She may continue regular over-the-counter cough medicine as she is tolerating at night.  Reassured that cough may last 6 to 8 weeks.  Form completed for school as requested.  Labs today as requested.     Return if symptoms worsen or fail to improve.  This note was prepared with assistance of Systems analyst. Occasional wrong-word or sound-a-like substitutions may have occurred due to the inherent limitations of voice recognition software.     Sierrah Luevano M Deema Juncaj, PA-C

## 2022-02-04 DIAGNOSIS — Z01419 Encounter for gynecological examination (general) (routine) without abnormal findings: Secondary | ICD-10-CM | POA: Diagnosis not present

## 2022-02-04 LAB — HEPATITIS B SURFACE ANTIBODY,QUALITATIVE: Hep B S Ab: REACTIVE — AB

## 2022-02-04 LAB — QUANTIFERON-TB GOLD PLUS
Mitogen-NIL: >10 IU/mL
NIL: 0.03 IU/mL
QuantiFERON-TB Gold Plus: NEGATIVE
TB1-NIL: 0 IU/mL
TB2-NIL: 0 IU/mL

## 2022-05-13 ENCOUNTER — Ambulatory Visit: Payer: Self-pay

## 2022-05-14 ENCOUNTER — Ambulatory Visit: Payer: BC Managed Care – PPO

## 2022-05-25 ENCOUNTER — Ambulatory Visit
Admission: RE | Admit: 2022-05-25 | Discharge: 2022-05-25 | Disposition: A | Discharge status: Home / Self Care | Payer: BC Managed Care – PPO | Source: Ambulatory Visit | Attending: Physician Assistant | Admitting: Physician Assistant

## 2022-05-25 VITALS — BP 146/95 | HR 105 | Temp 98.3°F | Resp 18

## 2022-05-25 DIAGNOSIS — B9689 Other specified bacterial agents as the cause of diseases classified elsewhere: Secondary | ICD-10-CM

## 2022-05-25 DIAGNOSIS — J208 Acute bronchitis due to other specified organisms: Secondary | ICD-10-CM | POA: Diagnosis not present

## 2022-05-25 DIAGNOSIS — T24211A Burn of second degree of right thigh, initial encounter: Secondary | ICD-10-CM

## 2022-05-25 MED ORDER — MUPIROCIN CALCIUM 2 % EX CREA: 1.0000 | TOPICAL_CREAM | Freq: Two times a day (BID) | CUTANEOUS | 0 refills | Status: DC

## 2022-05-25 MED ORDER — DOXYCYCLINE HYCLATE 100 MG PO TABS: 100.0000 mg | ORAL_TABLET | Freq: Two times a day (BID) | ORAL | 0 refills | Status: DC

## 2022-05-25 NOTE — Discharge Instructions (Addendum)
Return if any problems.
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
## 2022-06-19 ENCOUNTER — Other Ambulatory Visit: Payer: Self-pay | Admitting: Physician Assistant

## 2022-06-19 DIAGNOSIS — F32A Depression, unspecified: Secondary | ICD-10-CM

## 2022-06-25 ENCOUNTER — Ambulatory Visit: Payer: BC Managed Care – PPO | Admitting: Physician Assistant

## 2022-07-02 ENCOUNTER — Ambulatory Visit: Payer: BC Managed Care – PPO | Admitting: Physician Assistant

## 2022-07-24 ENCOUNTER — Encounter: Payer: Self-pay | Admitting: Physician Assistant

## 2022-07-24 ENCOUNTER — Ambulatory Visit (INDEPENDENT_AMBULATORY_CARE_PROVIDER_SITE_OTHER): Payer: BC Managed Care – PPO | Admitting: Physician Assistant

## 2022-07-24 VITALS — BP 127/79 | HR 99 | Temp 97.8°F | Ht 65.0 in | Wt 284.6 lb

## 2022-07-24 DIAGNOSIS — R6882 Decreased libido: Secondary | ICD-10-CM

## 2022-07-24 DIAGNOSIS — F419 Anxiety disorder, unspecified: Secondary | ICD-10-CM

## 2022-07-24 DIAGNOSIS — G43109 Migraine with aura, not intractable, without status migrainosus: Secondary | ICD-10-CM

## 2022-07-24 DIAGNOSIS — G4700 Insomnia, unspecified: Secondary | ICD-10-CM

## 2022-07-24 DIAGNOSIS — F32A Depression, unspecified: Secondary | ICD-10-CM

## 2022-07-24 MED ORDER — TOPIRAMATE 50 MG PO TABS: 50.0000 mg | ORAL_TABLET | Freq: Every day | ORAL | 3 refills | Status: DC

## 2022-07-24 NOTE — Progress Notes (Unsigned)
Subjective:    Patient ID: [name], female    DOB: 04-06-1985, 37 y.o.   MRN: [id number]  Chief Complaint  Patient presents with   Medication Refill    HPI Patient is in today for medication refills. See A/P for details.    Past Medical History:  Diagnosis Date   Allergy    Anxiety    Back pain    Constipation    Depression    Dyspnea    Gallbladder problem    GERD (gastroesophageal reflux disease)    IBS (irritable bowel syndrome)    Interstitial cystitis    Lactose intolerance    Migraine    Overactive bladder    Palpitations    PCOS (polycystic ovarian syndrome)    Pelvic pain    Pseudotumor cerebri    Pseudotumor cerebri    SUI (stress urinary incontinence, female)    Urinary incontinence     Past Surgical History:  Procedure Laterality Date   CESAREAN SECTION  2009  &  2011   2011 W/ BILATERAL TUBAL LIGATION   CHOLECYSTECTOMY  2010   CYSTO WITH HYDRODISTENSION  03/01/2012   Procedure: CYSTOSCOPY/HYDRODISTENSION;  Surgeon: Martina Sinner, MD;  Location: [facility];  Service: Urology;  Laterality: N/A;  INSTILLATION of marcaine and pyridium.   PUBOVAGINAL SLING N/A 02/14/2013   Procedure: PUBO-VAGINAL SLING AND HYDRODISTENSION;  Surgeon: Martina Sinner, MD;  Location: [facility];  Service: Urology;  Laterality: N/A;   TRANSTHORACIC ECHOCARDIOGRAM  06/2009   TUBAL LIGATION      Family History  Problem Relation Age of Onset   Hypertension Mother    Depression Mother    Diabetes Mother    Anxiety disorder Mother    Sleep apnea Mother    Obesity Mother    Hypertension Father    Cancer Father        kidney   Depression Father    Hyperlipidemia Father    Kidney cancer Father    Alcoholism Father    Obesity Father    Hypertension Maternal Grandmother    Cancer Maternal Grandmother        kidney, bone   Hypertension Maternal Grandfather    Cancer Maternal Grandfather        lung, bone   Stroke Maternal  Grandfather    Hypertension Paternal Grandmother    Hypertension Paternal Grandfather    Stroke Paternal Grandfather    Cancer Maternal Uncle        stomach    Social History   Tobacco Use   Smoking status: Never   Smokeless tobacco: Never  Vaping Use   Vaping Use: Never used  Substance Use Topics   Alcohol use: No   Drug use: No     No Known Allergies  Review of Systems NEGATIVE UNLESS OTHERWISE INDICATED IN HPI      Objective:     BP 127/79   Pulse 99   Temp 97.8 F (36.6 C) (Temporal)   Ht 5\' 5"  (1.651 m)   Wt 284 lb 9.6 oz (129.1 kg)   SpO2 97%   BMI 47.36 kg/m   Wt Readings from Last 3 Encounters:  07/24/22 284 lb 9.6 oz (129.1 kg)  02/02/22 281 lb (127.5 kg)  11/03/21 274 lb (124.3 kg)    BP Readings from Last 3 Encounters:  07/24/22 127/79  05/25/22 (!) 146/95  01/16/22 (!) 126/92     Physical Exam Vitals and nursing note reviewed.  Constitutional:      Appearance: Normal appearance. She is obese.  Eyes:     Extraocular Movements: Extraocular movements intact.     Conjunctiva/sclera: Conjunctivae normal.     Pupils: Pupils are equal, round, and reactive to light.  Cardiovascular:     Rate and Rhythm: Normal rate and regular rhythm.     Pulses: Normal pulses.  Pulmonary:     Effort: Pulmonary effort is normal.     Breath sounds: Normal breath sounds.  Neurological:     Mental Status: She is alert and oriented to person, place, and time.  Psychiatric:        Mood and Affect: Mood normal.        Behavior: Behavior normal.        Assessment & Plan:  Anxiety and depression Assessment & Plan: Stable with Effexor XR 150 mg.  May be contributing to low libido complaints. She would like to meet with psychiatry, referral placed today.   Orders: -     Ambulatory referral to Psychiatry  Insomnia, unspecified type Assessment & Plan: Chronic, Ambien 5 mg at bedtime, follows with Dr. Earlene Plater for this medication   Orders: -      Ambulatory referral to Psychiatry  Low libido -     Ambulatory referral to Psychiatry  Migraine with aura and without status migrainosus, not intractable Assessment & Plan: Stable with Topamax 50 mg at bedtime, refilled today   Orders: -     Topiramate; Take 1 tablet (50 mg total) by mouth at bedtime.  Dispense: 90 tablet; Refill: 3     Return in about 3 months (around 10/24/2022) for physical, fasting labs .   Guillermo Difrancesco M Jenavive Lamboy, PA-C

## 2022-07-26 NOTE — Assessment & Plan Note (Signed)
Stable with Topamax 50 mg at bedtime, refilled today

## 2022-07-26 NOTE — Assessment & Plan Note (Signed)
Chronic, Ambien 5 mg at bedtime, follows with Dr. Earlene Plater for this medication

## 2022-07-26 NOTE — Assessment & Plan Note (Signed)
Stable with Effexor XR 150 mg.  May be contributing to low libido complaints. She would like to meet with psychiatry, referral placed today.

## 2022-09-02 ENCOUNTER — Encounter: Payer: Self-pay | Admitting: Physician Assistant

## 2022-09-03 ENCOUNTER — Other Ambulatory Visit: Payer: Self-pay | Admitting: Physician Assistant

## 2022-09-08 ENCOUNTER — Other Ambulatory Visit: Payer: Self-pay | Admitting: Physician Assistant

## 2022-09-09 ENCOUNTER — Other Ambulatory Visit: Payer: Self-pay

## 2022-09-09 MED ORDER — HYDROXYZINE PAMOATE 25 MG PO CAPS: 25.0000 mg | ORAL_CAPSULE | Freq: Three times a day (TID) | ORAL | 2 refills | Status: DC | PRN

## 2022-09-09 NOTE — Telephone Encounter (Signed)
See patient response.

## 2022-09-22 ENCOUNTER — Other Ambulatory Visit: Payer: Self-pay | Admitting: Medical Genetics

## 2022-09-22 DIAGNOSIS — Z006 Encounter for examination for normal comparison and control in clinical research program: Secondary | ICD-10-CM

## 2022-11-12 ENCOUNTER — Ambulatory Visit (HOSPITAL_BASED_OUTPATIENT_CLINIC_OR_DEPARTMENT_OTHER): Payer: Self-pay | Admitting: Psychiatry

## 2022-11-12 ENCOUNTER — Encounter (HOSPITAL_COMMUNITY): Payer: Self-pay | Admitting: Psychiatry

## 2022-11-12 ENCOUNTER — Other Ambulatory Visit: Payer: Self-pay

## 2022-11-12 VITALS — BP 124/72 | HR 80 | Ht 64.0 in | Wt 288.0 lb

## 2022-11-12 DIAGNOSIS — G4709 Other insomnia: Secondary | ICD-10-CM

## 2022-11-12 DIAGNOSIS — F32A Depression, unspecified: Secondary | ICD-10-CM

## 2022-11-12 DIAGNOSIS — F419 Anxiety disorder, unspecified: Secondary | ICD-10-CM

## 2022-11-12 MED ORDER — ZOLPIDEM TARTRATE 5 MG PO TABS: 5.0000 mg | ORAL_TABLET | Freq: Every day | ORAL | 0 refills | Status: DC

## 2022-11-12 MED ORDER — BUPROPION HCL ER (XL) 150 MG PO TB24: 150.0000 mg | ORAL_TABLET | Freq: Every day | ORAL | 0 refills | Status: DC

## 2022-11-12 MED ORDER — VENLAFAXINE HCL ER 37.5 MG PO CP24: ORAL_CAPSULE | ORAL | 0 refills | Status: DC
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
## 2022-11-30 ENCOUNTER — Other Ambulatory Visit (HOSPITAL_COMMUNITY): Payer: Self-pay | Attending: Medical Genetics

## 2022-12-10 ENCOUNTER — Ambulatory Visit (HOSPITAL_BASED_OUTPATIENT_CLINIC_OR_DEPARTMENT_OTHER): Payer: Self-pay | Admitting: Psychiatry

## 2022-12-10 ENCOUNTER — Other Ambulatory Visit (HOSPITAL_COMMUNITY): Payer: Self-pay | Admitting: Psychiatry

## 2022-12-10 ENCOUNTER — Encounter (HOSPITAL_COMMUNITY): Payer: Self-pay | Admitting: Psychiatry

## 2022-12-10 ENCOUNTER — Other Ambulatory Visit: Payer: Self-pay

## 2022-12-10 VITALS — BP 119/78 | HR 93 | Ht 64.0 in | Wt 286.0 lb

## 2022-12-10 DIAGNOSIS — F419 Anxiety disorder, unspecified: Secondary | ICD-10-CM

## 2022-12-10 DIAGNOSIS — F32A Depression, unspecified: Secondary | ICD-10-CM

## 2022-12-10 DIAGNOSIS — G4709 Other insomnia: Secondary | ICD-10-CM

## 2022-12-10 MED ORDER — ZOLPIDEM TARTRATE 5 MG PO TABS: 5.0000 mg | ORAL_TABLET | Freq: Every day | ORAL | 1 refills | Status: DC

## 2022-12-10 MED ORDER — BUPROPION HCL ER (XL) 300 MG PO TB24: 300.0000 mg | ORAL_TABLET | Freq: Every day | ORAL | 1 refills | Status: DC

## 2022-12-10 NOTE — Progress Notes (Signed)
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  She was referred from primary care for anxiety and depression.  She was taking Effexor and did not feel it was working for her PCP started hydroxyzine to help her anxiety and sleep.  She was also complaining of sexual side effects to Effexor.  She was also taking Ambien which was not continued PCP.  We started her on Wellbutrin because she had a good response.  She is now taking Wellbutrin 150 mg daily.  So far she is tolerating medication and reported no headaches, tremors, nausea.  She cut down her Effexor to 37.5 mg.  She is sleeping better since a being restarted.  She also reported her sexual side effects from the Effexor is slowly and gradually getting better.  Since the last visit.  She is on weight loss medication but taking slowly and gradually dose titration due to nausea.  The job is going well.  She remains busy taking care of 39 year old and 39 year old.  Patient denies any crying spells, feeling of worthlessness she denies any major panic attack.  Energy level is slowly improving.  Patient has no travel planned for holidays.  Her parent lives 40 minutes away but she is not sure if she would go and visit them.  Visit Diagnosis:    ICD-10-CM   1. Anxiety and depression  F41.9 buPROPion (WELLBUTRIN XL) 300 MG 24 hr tablet   F32.A     2. Other insomnia  G47.09 zolpidem (AMBIEN) 5 MG tablet      Past Psychiatric History:  History of anxiety and depression. Tried Wellbutrin for weight loss.  Tried amitriptyline for migraine.  PCP tried hydroxyzine, trazodone, Belsomra for insomnia.  Effexor for long time until it stopped working.   and Effexor. Past Medical History:  Past Medical History:  Diagnosis Date   Allergy    Anxiety    Back  pain    Constipation    Depression    Dyspnea    Gallbladder problem    GERD (gastroesophageal reflux disease)    IBS (irritable bowel syndrome)    Interstitial cystitis    Lactose intolerance    Migraine    Overactive bladder    Palpitations    PCOS (polycystic ovarian syndrome)    Pelvic pain    Pseudotumor cerebri    Pseudotumor cerebri    SUI (stress urinary incontinence, female)    Urinary incontinence     Past Surgical History:  Procedure Laterality Date   CESAREAN SECTION  2009  &  2011   2011 W/ BILATERAL TUBAL LIGATION   CHOLECYSTECTOMY  2010   CYSTO WITH HYDRODISTENSION  03/01/2012   Procedure: CYSTOSCOPY/HYDRODISTENSION;  Surgeon: Martina Sinner, MD;  Location: [facility];  Service: Urology;  Laterality: N/A;  INSTILLATION of marcaine and pyridium.   PUBOVAGINAL SLING N/A 02/14/2013   Procedure: PUBO-VAGINAL SLING AND HYDRODISTENSION;  Surgeon: Martina Sinner, MD;  Location[facility];  Service: Urology;  Laterality: N/A;   TRANSTHORACIC ECHOCARDIOGRAM  06/2009   TUBAL LIGATION      Family Psychiatric History: Reviewed  Family History:  Family History  Problem Relation Age of Onset   Hypertension Mother    Depression Mother    Diabetes Mother  Anxiety disorder Mother    Sleep apnea Mother    Obesity Mother    Hypertension Father    Cancer Father        kidney   Depression Father    Hyperlipidemia Father    Kidney cancer Father    Alcoholism Father    Obesity Father    Hypertension Maternal Grandmother    Cancer Maternal Grandmother        kidney, bone   Hypertension Maternal Grandfather    Cancer Maternal Grandfather        lung, bone   Stroke Maternal Grandfather    Hypertension Paternal Grandmother    Hypertension Paternal Grandfather    Stroke Paternal Grandfather    Cancer Maternal Uncle        stomach    Social History:  Social History   Socioeconomic History   Marital status: Married     Spouse name: [name]   Number of children: 2   Years of education: Not on file   Highest education level: Master's degree (e.g., MA, MS, MEng, MEd, MSW, MBA)  Occupational History   Occupation: Designer, jewellery    Employer: [employer]  Tobacco Use   Smoking status: Never   Smokeless tobacco: Never  Vaping Use   Vaping status: Never Used  Substance and Sexual Activity   Alcohol use: No   Drug use: No   Sexual activity: Yes    Birth control/protection: Surgical  Other Topics Concern   Not on file  Social History Narrative   Not on file   Social Determinants of Health   Financial Resource Strain: Low Risk  (07/23/2022)   Overall Financial Resource Strain (CARDIA)    Difficulty of Paying Living Expenses: Not hard at all  Food Insecurity: No Food Insecurity (07/23/2022)   Hunger Vital Sign    Worried About Running Out of Food in the Last Year: Never true    Ran Out of Food in the Last Year: Never true  Transportation Needs: No Transportation Needs (07/23/2022)   PRAPARE - Administrator, Civil Service (Medical): No    Lack of Transportation (Non-Medical): No  Physical Activity: Unknown (07/23/2022)   Exercise Vital Sign    Days of Exercise per Week: 0 days    Minutes of Exercise per Session: Not on file  Stress: No Stress Concern Present (07/23/2022)   [employer] of Occupational Health - Occupational Stress Questionnaire    Feeling of Stress : Only a little  Social Connections: Socially Isolated (07/23/2022)   Social Connection and Isolation Panel [NHANES]    Frequency of Communication with Friends and Family: Once a week    Frequency of Social Gatherings with Friends and Family: Never    Attends Religious Services: Never    Database administrator or Organizations: No    Attends Engineer, structural: Not on file    Marital Status: Married    Allergies: No Known Allergies  Metabolic Disorder Labs: Lab Results  Component Value Date    HGBA1C 5.4 06/11/2020   No results found for: "PROLACTIN" Lab Results  Component Value Date   CHOL 135 02/18/2021   TRIG 72 02/18/2021   HDL 66 02/18/2021   CHOLHDL 2.0 02/18/2021   VLDL 16.1 10/09/2019   LDLCALC 55 02/18/2021   LDLCALC 36 10/09/2019   Lab Results  Component Value Date   TSH 2.100 02/18/2021   TSH 1.820 02/02/2019    Therapeutic Level Labs: No results found  for: "LITHIUM" No results found for: "VALPROATE" No results found for: "CBMZ"  Current Medications: Current Outpatient Medications  Medication Sig Dispense Refill   cetirizine (ZYRTEC) 10 MG tablet Take 10 mg by mouth daily.     fluticasone (FLONASE) 50 MCG/ACT nasal spray Place 2 sprays into both nostrils daily. 16 g 6   Melatonin 10 MG TABS Take 10 mg by mouth at bedtime.      Multiple Vitamin (MULTIVITAMIN) tablet Take 1 tablet by mouth daily.     spironolactone (ALDACTONE) 100 MG tablet Take 100 mg by mouth daily.  4   SPRINTEC 28 0.25-35 MG-MCG tablet Take 1 tablet by mouth daily.  4   topiramate (TOPAMAX) 50 MG tablet Take 1 tablet (50 mg total) by mouth at bedtime. 90 tablet 3   venlafaxine XR (EFFEXOR-XR) 37.5 MG 24 hr capsule Take 3 capsule daily for 10 days and than two capsule daily. 90 capsule 0   buPROPion (WELLBUTRIN XL) 300 MG 24 hr tablet Take 1 tablet (300 mg total) by mouth daily. 30 tablet 1   Cholecalciferol (VITAMIN D) 125 MCG (5000 UT) CAPS Take 1 capsule by mouth daily. 30 capsule 0   zolpidem (AMBIEN) 5 MG tablet Take 1 tablet (5 mg total) by mouth at bedtime. 30 tablet 1   No current facility-administered medications for this visit.     Musculoskeletal: Strength & Muscle Tone: within normal limits Gait & Station: normal Patient leans: N/A  Psychiatric Specialty Exam: Review of Systems  Blood pressure 119/78, pulse 93, height 5\' 4"  (1.626 m), weight 286 lb (129.7 kg).Body mass index is 49.09 kg/m.  General Appearance: Casual  Eye Contact:  Good  Speech:  Clear and  Coherent and Normal Rate  Volume:  Normal  Mood:  Euthymic  Affect:  Appropriate  Thought Process:  Goal Directed  Orientation:  Full (Time, Place, and Person)  Thought Content: WDL   Suicidal Thoughts:  No  Homicidal Thoughts:  No  Memory:  Immediate;   Good Recent;   Good Remote;   Good  Judgement:  Good  Insight:  Present  Psychomotor Activity:  Normal  Concentration:  Concentration: Good and Attention Span: Good  Recall:  Good  Fund of Knowledge: Good  Language: Good  Akathisia:  No  Handed:  Right  AIMS (if indicated): not done  Assets:  Communication Skills Desire for Improvement Housing Resilience Social Support Talents/Skills Transportation Vocational/Educational  ADL's:  Intact  Cognition: WNL  Sleep:  Good   Screenings: GAD-7    Flowsheet Row [facility] Office Visit from 04/29/2018 in [facility]  Total GAD-7 Score 6 3      PHQ2-9    Flowsheet Row Office Visit from 11/12/2022 in [facility] Office Visit from 11/03/2021 in [facility] [employer] [facility] from 10/09/2019 in [facility] [employer] Visit from 11/01/2018 in [facility] Visit from 07/21/2018 in [facility] Total Score 3 0 0 4 1  PHQ-9 Total Score 9 -- 0 13 2      Flowsheet Row Office Visit from 11/12/2022 in [facility] ED from 05/25/2022 in [facility] ED from 01/16/2022 in [facility])  C-SSRS RISK CATEGORY No Risk No Risk No Risk  Assessment and Plan: Patient is tolerating Wellbutrin without any side effects.  Recommend increase Wellbutrin to 300 mg and stop the venlafaxine.  She  has not taken hydroxyzine since the last visit.  Continue Ambien 5 mg as needed for insomnia.  Patient not interested in therapy.  Recommended to call us back if she has any question or any concern.  Follow-up in 2 months.  Collaboration of Care: Collaboration of Care: Other provider involved in patient's care AEB notes are available in epic to review  Patient/Guardian was advised Release of Information must be obtained prior to any record release in order to collaborate their care with an outside provider. Patient/Guardian was advised if they have not already done so to contact the registration department to sign all necessary forms in order for [place] to release information regarding their care.   Consent: Patient/Guardian gives verbal consent for treatment and assignment of benefits for services provided during this visit. Patient/Guardian expressed understanding and agreed to proceed.   I provided 23 minutes face-to-face time during this encounter.  Cleotis Nipper, MD 12/10/2022, 2:55 PM

## 2023-01-28 ENCOUNTER — Ambulatory Visit
Admission: RE | Admit: 2023-01-28 | Discharge: 2023-01-28 | Disposition: A | Discharge status: Home / Self Care | Payer: Self-pay | Source: Ambulatory Visit | Attending: Family Medicine | Admitting: Family Medicine

## 2023-01-28 VITALS — BP 107/74 | HR 94 | Temp 98.0°F | Resp 18

## 2023-01-28 DIAGNOSIS — J4521 Mild intermittent asthma with (acute) exacerbation: Secondary | ICD-10-CM

## 2023-01-28 DIAGNOSIS — J22 Unspecified acute lower respiratory infection: Secondary | ICD-10-CM

## 2023-01-28 MED ORDER — AZITHROMYCIN 250 MG PO TABS: ORAL_TABLET | ORAL | 0 refills | Status: DC

## 2023-01-28 MED ORDER — PROMETHAZINE-DM 6.25-15 MG/5ML PO SYRP: 5.0000 mL | ORAL_SOLUTION | Freq: Four times a day (QID) | ORAL | 0 refills | Status: DC | PRN

## 2023-01-28 MED ORDER — DEXAMETHASONE SODIUM PHOSPHATE 10 MG/ML IJ SOLN
10.0000 mg | Freq: Once | INTRAMUSCULAR | Status: AC
  Administered 2023-01-28: 10 mg via INTRAMUSCULAR

## 2023-01-28 NOTE — Discharge Instructions (Signed)
 We have given you a steroid shot today and I have prescribed an antibiotic and a cough syrup.  Continue using albuterol as needed in addition to Mucinex and other over-the-counter remedies.

## 2023-01-28 NOTE — ED Triage Notes (Signed)
 Patient presents to the office for cough x 2 weeks. Patient states her phlegm is clear.

## 2023-01-28 NOTE — ED Provider Notes (Signed)
 [facility]    CSN: [id number] Arrival date & time: 01/28/23  1805      History   Chief Complaint Chief Complaint  Patient presents with   Cough    Entered by patient   Fatigue    HPI [name] is a 38 y.o. female.   Patient presenting today with 2-week history of progressively worsening nasal congestion, facial pain and pressure, productive cough, chest tightness, wheezing, shortness of breath.  Denies fever, chills, chest pain, abdominal pain, nausea vomiting or diarrhea.  History of asthma on albuterol  as needed but states the albuterol  is no longer helping very much.  So far trying Mucinex , DayQuil NyQuil and other supportive remedies with minimal relief.    Past Medical History:  Diagnosis Date   Allergy    Anxiety    Back pain    Constipation    Depression    Dyspnea    Gallbladder problem    GERD (gastroesophageal reflux disease)    IBS (irritable bowel syndrome)    Interstitial cystitis    Lactose intolerance    Migraine    Overactive bladder    Palpitations    PCOS (polycystic ovarian syndrome)    Pelvic pain    Pseudotumor cerebri    Pseudotumor cerebri    SUI (stress urinary incontinence, female)    Urinary incontinence     Patient Active Problem List   Diagnosis Date Noted   Right shoulder tendinitis 11/08/2021   Insect bite of right arm, initial encounter 11/08/2021   Migraine with aura and without status migrainosus, not intractable 03/19/2021   Pre-diabetes 06/12/2020   Vitamin D  deficiency 06/12/2020   Insulin  resistance 04/18/2019   Class 3 severe obesity with serious comorbidity and body mass index (BMI) of 45.0 to 49.9 in adult (HCC) 04/18/2019   Elevated TSH 07/19/2017   Fatigue 07/19/2017   Encounter for annual physical exam 05/05/2016   Anxiety and depression 01/18/2016   Insomnia 01/18/2016   Chronic migraine 01/18/2016   CARDIOMEGALY 06/13/2009    Past Surgical History:  Procedure Laterality Date    CESAREAN SECTION  2009  &  2011   2011 W/ BILATERAL TUBAL LIGATION   CHOLECYSTECTOMY  2010   CYSTO WITH HYDRODISTENSION  03/01/2012   Procedure: CYSTOSCOPY/HYDRODISTENSION;  Surgeon: Glendia DELENA Elizabeth, MD;  Location[facility];  Service: Urology;  Laterality: N/A;  INSTILLATION of marcaine  and pyridium .   PUBOVAGINAL SLING N/A 02/14/2013   Procedure: PUBO-VAGINAL SLING AND HYDRODISTENSION;  Surgeon: Glendia DELENA Elizabeth, MD;  Location: [facility];  Service: Urology;  Laterality: N/A;   TRANSTHORACIC ECHOCARDIOGRAM  06/2009   TUBAL LIGATION      OB History     Gravida  3   Para      Term      Preterm      AB  1   Living  2      SAB  1   IAB      Ectopic      Multiple      Live Births               Home Medications    Prior to Admission medications   Medication Sig Start Date End Date Taking? Authorizing Provider  azithromycin  (ZITHROMAX ) 250 MG tablet Take first 2 tablets together, then 1 every day until finished. 01/28/23  Yes Stuart Vernell Norris, PA-C  buPROPion  (WELLBUTRIN  XL) 300 MG 24 hr tablet Take 1 tablet (300  mg total) by mouth daily. 12/10/22  Yes Arfeen, Leni DASEN, MD  cetirizine (ZYRTEC) 10 MG tablet Take 10 mg by mouth daily.   Yes [provider]  Cholecalciferol (VITAMIN D ) 125 MCG (5000 UT) CAPS Take 1 capsule by mouth daily. 06/11/20  Yes Whitmire, Dawn, FNP  fluticasone  (FLONASE ) 50 MCG/ACT nasal spray Place 2 sprays into both nostrils daily. 04/20/18  Yes Hawks, Christy A, FNP  Melatonin 10 MG TABS Take 10 mg by mouth at bedtime.    Yes [provider]  Multiple Vitamin (MULTIVITAMIN) tablet Take 1 tablet by mouth daily.   Yes [provider]  promethazine -dextromethorphan (PROMETHAZINE -DM) 6.25-15 MG/5ML syrup Take 5 mLs by mouth 4 (four) times daily as needed. 01/28/23  Yes Stuart Vernell Norris, PA-C  spironolactone (ALDACTONE) 100 MG tablet Take 100 mg by mouth daily. 12/03/15  Yes [provider]  SPRINTEC 28 0.25-35 MG-MCG tablet Take 1 tablet by mouth daily. 12/01/15  Yes [provider]  topiramate  (TOPAMAX ) 50 MG tablet Take 1 tablet (50 mg total) by mouth at bedtime. 07/24/22  Yes Allwardt, Alyssa M, PA-C  venlafaxine  XR (EFFEXOR -XR) 37.5 MG 24 hr capsule Take 3 capsule daily for 10 days and than two capsule daily. 11/12/22  Yes Arfeen, Leni DASEN, MD  zolpidem  (AMBIEN ) 5 MG tablet Take 1 tablet (5 mg total) by mouth at bedtime. 12/10/22  Yes Arfeen, Leni DASEN, MD    Family History Family History  Problem Relation Age of Onset   Hypertension Mother    Depression Mother    Diabetes Mother    Anxiety disorder Mother    Sleep apnea Mother    Obesity Mother    Hypertension Father    Cancer Father        kidney   Depression Father    Hyperlipidemia Father    Kidney cancer Father    Alcoholism Father    Obesity Father    Hypertension Maternal Grandmother    Cancer Maternal Grandmother        kidney, bone   Hypertension Maternal Grandfather    Cancer Maternal Grandfather        lung, bone   Stroke Maternal Grandfather    Hypertension Paternal Grandmother    Hypertension Paternal Grandfather    Stroke Paternal Grandfather    Cancer Maternal Uncle        stomach    Social History Social History   Tobacco Use   Smoking status: Never   Smokeless tobacco: Never  Vaping Use   Vaping status: Never Used  Substance Use Topics   Alcohol use: No   Drug use: No     Allergies   Patient has no known allergies.   Review of Systems Review of Systems Per HPI  Physical Exam Triage Vital Signs ED Triage Vitals  Encounter Vitals Group     BP 01/28/23 1833 107/74     Systolic BP Percentile --      Diastolic BP Percentile --      Pulse Rate 01/28/23 1833 94     Resp 01/28/23 1833 18     Temp 01/28/23 1833 98 F (36.7 C)     Temp Source 01/28/23 1833 Oral     SpO2 01/28/23 1833 98 %     Weight --      Height --      Head Circumference --       Peak Flow --      Pain Score 01/28/23 1839 0  Pain Loc --      Pain Education --      Exclude from Growth Chart --    No data found.  Updated Vital Signs BP 107/74 (BP Location: Left Arm)   Pulse 94   Temp 98 F (36.7 C) (Oral)   Resp 18   LMP 01/20/2023   SpO2 98%   Visual Acuity Right Eye Distance:   Left Eye Distance:   Bilateral Distance:    Right Eye Near:   Left Eye Near:    Bilateral Near:     Physical Exam Vitals and nursing note reviewed.  Constitutional:      Appearance: Normal appearance.  HENT:     Head: Atraumatic.     Right Ear: Tympanic membrane and external ear normal.     Left Ear: Tympanic membrane and external ear normal.     Nose: Congestion present.     Mouth/Throat:     Mouth: Mucous membranes are moist.     Pharynx: Posterior oropharyngeal erythema present.  Eyes:     Extraocular Movements: Extraocular movements intact.     Conjunctiva/sclera: Conjunctivae normal.  Cardiovascular:     Rate and Rhythm: Normal rate and regular rhythm.     Heart sounds: Normal heart sounds.  Pulmonary:     Effort: Pulmonary effort is normal.     Breath sounds: Wheezing and rales present.  Musculoskeletal:        General: Normal range of motion.     Cervical back: Normal range of motion and neck supple.  Skin:    General: Skin is warm and dry.  Neurological:     Mental Status: She is alert and oriented to person, place, and time.  Psychiatric:        Mood and Affect: Mood normal.        Thought Content: Thought content normal.      UC Treatments / Results  Labs (all labs ordered are listed, but only abnormal results are displayed) Labs Reviewed - No data to display  EKG   Radiology No results found.  Procedures Procedures (including critical care time)  Medications Ordered in UC Medications  dexamethasone  (DECADRON ) injection 10 mg (has no administration in time range)    Initial Impression / Assessment and Plan / UC Course  I have  reviewed the triage vital signs and the nursing notes.  Pertinent labs & imaging results that were available during my care of the patient were reviewed by me and considered in my medical decision making (see chart for details).     Given duration worsening course will treat for lower respiratory infection with asthma exacerbation.  Treat with IM Decadron , Phenergan  DM, Zithromax , albuterol  as needed and supportive over-the-counter medications and home care.  Return for worsening symptoms.  Final Clinical Impressions(s) / UC Diagnoses   Final diagnoses:  Lower respiratory infection  Mild intermittent asthma with acute exacerbation     Discharge Instructions      We have given you a steroid shot today and I have prescribed an antibiotic and a cough syrup.  Continue using albuterol  as needed in addition to Mucinex  and other over-the-counter remedies.    ED Prescriptions     Medication Sig Dispense Auth. Provider   promethazine -dextromethorphan (PROMETHAZINE -DM) 6.25-15 MG/5ML syrup Take 5 mLs by mouth 4 (four) times daily as needed. 100 mL Stuart Vernell Norris, PA-C   azithromycin  (ZITHROMAX ) 250 MG tablet Take first 2 tablets together, then 1 every day until finished. 6 tablet  Stuart Vernell Norris, [place]      PDMP not reviewed this encounter.   Stuart Vernell Norris, [place] 01/28/23 1909

## 2023-02-05 ENCOUNTER — Telehealth: Payer: Self-pay | Admitting: Family Medicine

## 2023-02-05 DIAGNOSIS — J4540 Moderate persistent asthma, uncomplicated: Secondary | ICD-10-CM

## 2023-02-05 MED ORDER — DOXYCYCLINE HYCLATE 100 MG PO TABS: 100.0000 mg | ORAL_TABLET | Freq: Two times a day (BID) | ORAL | 0 refills | Status: AC

## 2023-02-05 MED ORDER — PREDNISONE 20 MG PO TABS: 20.0000 mg | ORAL_TABLET | Freq: Two times a day (BID) | ORAL | 0 refills | Status: AC

## 2023-02-05 NOTE — Patient Instructions (Signed)

## 2023-02-05 NOTE — Progress Notes (Signed)
 Virtual Visit Consent   [name], you are scheduled for a virtual visit with a [facility] provider today. Just as with appointments in the office, your consent must be obtained to participate. Your consent will be active for this visit and any virtual visit you may have with one of our providers in the next 365 days. If you have a MyChart account, a copy of this consent can be sent to you electronically.  As this is a virtual visit, video technology does not allow for your provider to perform a traditional examination. This may limit your provider's ability to fully assess your condition. If your provider identifies any concerns that need to be evaluated in person or the need to arrange testing (such as labs, EKG, etc.), we will make arrangements to do so. Although advances in technology are sophisticated, we cannot ensure that it will always work on either your end or our end. If the connection with a video visit is poor, the visit may have to be switched to a telephone visit. With either a video or telephone visit, we are not always able to ensure that we have a secure connection.  By engaging in this virtual visit, you consent to the provision of healthcare and authorize for your insurance to be billed (if applicable) for the services provided during this visit. Depending on your insurance coverage, you may receive a charge related to this service.  I need to obtain your verbal consent now. Are you willing to proceed with your visit today? JERLINE LINZY has provided verbal consent on 02/05/2023 for a virtual visit (video or telephone). Loa Lamp, FNP  Date: 02/05/2023 5:29 PM  Virtual Visit via Video Note   I, Loa Lamp, connected with  [name]  ([id number], 1985/08/18) on 02/05/23 at  5:30 PM EST by a video-enabled telemedicine application and verified that I am speaking with the correct person using two identifiers.  Location: Patient: Virtual Visit Location Patient:  Home Provider: Virtual Visit Location Provider: Home Office   I discussed the limitations of evaluation and management by telemedicine and the availability of in person appointments. The patient expressed understanding and agreed to proceed.    History of Present Illness: [name] is a 38 y.o. who identifies as a female who was assigned female at birth, and is being seen today for cough, wheezing, no fever. Yellow mucus. Took zpack last month and sx improved then started again 2 weeks ago at Christmas. In no distress. Using albuterol  inhaler. SABRA  HPI: HPI  Problems:  Patient Active Problem List   Diagnosis Date Noted   Right shoulder tendinitis 11/08/2021   Insect bite of right arm, initial encounter 11/08/2021   Migraine with aura and without status migrainosus, not intractable 03/19/2021   Pre-diabetes 06/12/2020   Vitamin D  deficiency 06/12/2020   Insulin  resistance 04/18/2019   Class 3 severe obesity with serious comorbidity and body mass index (BMI) of 45.0 to 49.9 in adult [facility]) 04/18/2019   Elevated TSH 07/19/2017   Fatigue 07/19/2017   Encounter for annual physical exam 05/05/2016   Anxiety and depression 01/18/2016   Insomnia 01/18/2016   Chronic migraine 01/18/2016   CARDIOMEGALY 06/13/2009    Allergies: No Known Allergies Medications:  Current Outpatient Medications:    doxycycline  (VIBRA -TABS) 100 MG tablet, Take 1 tablet (100 mg total) by mouth 2 (two) times daily for 10 days., Disp: 20 tablet, Rfl: 0   predniSONE  (DELTASONE ) 20 MG tablet, Take 1 tablet (  20 mg total) by mouth 2 (two) times daily with a meal for 5 days., Disp: 10 tablet, Rfl: 0   azithromycin  (ZITHROMAX ) 250 MG tablet, Take first 2 tablets together, then 1 every day until finished., Disp: 6 tablet, Rfl: 0   buPROPion  (WELLBUTRIN  XL) 300 MG 24 hr tablet, Take 1 tablet (300 mg total) by mouth daily., Disp: 30 tablet, Rfl: 1   cetirizine (ZYRTEC) 10 MG tablet, Take 10 mg by mouth daily., Disp: ,  Rfl:    Cholecalciferol (VITAMIN D ) 125 MCG (5000 UT) CAPS, Take 1 capsule by mouth daily., Disp: 30 capsule, Rfl: 0   fluticasone  (FLONASE ) 50 MCG/ACT nasal spray, Place 2 sprays into both nostrils daily., Disp: 16 g, Rfl: 6   Melatonin 10 MG TABS, Take 10 mg by mouth at bedtime. , Disp: , Rfl:    Multiple Vitamin (MULTIVITAMIN) tablet, Take 1 tablet by mouth daily., Disp: , Rfl:    promethazine -dextromethorphan (PROMETHAZINE -DM) 6.25-15 MG/5ML syrup, Take 5 mLs by mouth 4 (four) times daily as needed., Disp: 100 mL, Rfl: 0   spironolactone (ALDACTONE) 100 MG tablet, Take 100 mg by mouth daily., Disp: , Rfl: 4   SPRINTEC 28 0.25-35 MG-MCG tablet, Take 1 tablet by mouth daily., Disp: , Rfl: 4   topiramate  (TOPAMAX ) 50 MG tablet, Take 1 tablet (50 mg total) by mouth at bedtime., Disp: 90 tablet, Rfl: 3   venlafaxine  XR (EFFEXOR -XR) 37.5 MG 24 hr capsule, Take 3 capsule daily for 10 days and than two capsule daily., Disp: 90 capsule, Rfl: 0   zolpidem  (AMBIEN ) 5 MG tablet, Take 1 tablet (5 mg total) by mouth at bedtime., Disp: 30 tablet, Rfl: 1  Observations/Objective: Patient is well-developed, well-nourished in no acute distress.  Resting comfortably  at home.  Head is normocephalic, atraumatic.  No labored breathing.  Speech is clear and coherent with logical content.  Patient is alert and oriented at baseline.   Assessment and Plan: 1. Moderate persistent asthmatic bronchitis without complication (Primary)  Increase fluids UC if sx persist or worsen.   Follow Up Instructions: I discussed the assessment and treatment plan with the patient. The patient was provided an opportunity to ask questions and all were answered. The patient agreed with the plan and demonstrated an understanding of the instructions.  A copy of instructions were sent to the patient via MyChart unless otherwise noted below.     The patient was advised to call back or seek an in-person evaluation if the symptoms  worsen or if the condition fails to improve as anticipated.    Jdyn Parkerson, FNP

## 2023-02-18 ENCOUNTER — Ambulatory Visit (HOSPITAL_COMMUNITY): Payer: Self-pay | Admitting: Psychiatry

## 2023-03-08 ENCOUNTER — Telehealth (HOSPITAL_BASED_OUTPATIENT_CLINIC_OR_DEPARTMENT_OTHER): Payer: No Typology Code available for payment source | Admitting: Psychiatry

## 2023-03-08 ENCOUNTER — Encounter (HOSPITAL_COMMUNITY): Payer: Self-pay | Admitting: Psychiatry

## 2023-03-08 VITALS — Wt 286.0 lb

## 2023-03-08 DIAGNOSIS — F419 Anxiety disorder, unspecified: Secondary | ICD-10-CM

## 2023-03-08 DIAGNOSIS — F32A Depression, unspecified: Secondary | ICD-10-CM

## 2023-03-08 DIAGNOSIS — G4709 Other insomnia: Secondary | ICD-10-CM | POA: Diagnosis not present

## 2023-03-08 MED ORDER — BUPROPION HCL ER (XL) 300 MG PO TB24: 300.0000 mg | ORAL_TABLET | Freq: Every day | ORAL | 2 refills | Status: DC

## 2023-03-08 MED ORDER — ZOLPIDEM TARTRATE 5 MG PO TABS: 5.0000 mg | ORAL_TABLET | Freq: Every day | ORAL | 2 refills | Status: DC

## 2023-03-08 NOTE — Progress Notes (Signed)
  Health MD Virtual Progress Note   Patient Location: Home Provider Location: Home Office  I connect with patient by video and verified that I am speaking with correct person by using two identifiers. I discussed the limitations of evaluation and management by telemedicine and the availability of in person appointments. I also discussed with the patient that there may be a patient responsible charge related to this service. The patient expressed understanding and agreed to proceed.  [name] [id number] 38 y.o.  03/08/2023 4:03 PM  History of Present Illness:  Patient is evaluated by video session.  She reported things are going well.  She is taking Wellbutrin  300 mg and denies any tremor or shakes or any EPS.  Her anxiety depression is stable.  She had good Thanksgiving.  She visited her father's been doing well.  She is no longer taking Effexor .  She is taking Ambien  which is helping her sleep but there are nights when she feel that she does not need the Ambien  and sleep is okay.  Patient works as a Engineer, civil (consulting) at [employer] and job is very busy but manageable.  She is on Ozempic and she reported weight is stable.  Her appetite is okay.  She lives with her 40 year old and 40 year old.  Patient told last month she got sick with infection and received antibiotic and steroid injection and now she is doing much better but still have some residual cough.  She is off from antibiotics.  She denies any feeling of hopelessness or worthlessness but she denies any major panic attack.  She likes to keep the current Wellbutrin  and Ambien  which she takes as needed for insomnia.  Past Psychiatric History: History of anxiety and depression. Tried Wellbutrin  for weight loss.  Tried amitriptyline  for migraine.  PCP tried hydroxyzine , trazodone, Belsomra  for insomnia.  Effexor  for long time until it stopped working.    Outpatient Encounter Medications as of 03/08/2023  Medication Sig    buPROPion  (WELLBUTRIN  XL) 300 MG 24 hr tablet Take 1 tablet (300 mg total) by mouth daily.   cetirizine (ZYRTEC) 10 MG tablet Take 10 mg by mouth daily.   Cholecalciferol (VITAMIN D ) 125 MCG (5000 UT) CAPS Take 1 capsule by mouth daily.   fluticasone  (FLONASE ) 50 MCG/ACT nasal spray Place 2 sprays into both nostrils daily.   Melatonin 10 MG TABS Take 10 mg by mouth at bedtime.    Multiple Vitamin (MULTIVITAMIN) tablet Take 1 tablet by mouth daily.   promethazine -dextromethorphan (PROMETHAZINE -DM) 6.25-15 MG/5ML syrup Take 5 mLs by mouth 4 (four) times daily as needed.   spironolactone (ALDACTONE) 100 MG tablet Take 100 mg by mouth daily.   SPRINTEC 28 0.25-35 MG-MCG tablet Take 1 tablet by mouth daily.   topiramate  (TOPAMAX ) 50 MG tablet Take 1 tablet (50 mg total) by mouth at bedtime.   zolpidem  (AMBIEN ) 5 MG tablet Take 1 tablet (5 mg total) by mouth at bedtime.   [DISCONTINUED] azithromycin  (ZITHROMAX ) 250 MG tablet Take first 2 tablets together, then 1 every day until finished.   [DISCONTINUED] buPROPion  (WELLBUTRIN  XL) 300 MG 24 hr tablet Take 1 tablet (300 mg total) by mouth daily.   [DISCONTINUED] venlafaxine  XR (EFFEXOR -XR) 37.5 MG 24 hr capsule Take 3 capsule daily for 10 days and than two capsule daily.   [DISCONTINUED] zolpidem  (AMBIEN ) 5 MG tablet Take 1 tablet (5 mg total) by mouth at bedtime.   No facility-administered encounter medications on file as of 03/08/2023.    No results found  for this or any previous visit (from the past 2160 hours).   Psychiatric Specialty Exam: Physical Exam  Review of Systems  Weight 286 lb (129.7 kg).There is no height or weight on file to calculate BMI.  General Appearance: Casual  Eye Contact:  Good  Speech:  Clear and Coherent  Volume:  Normal  Mood:  Euthymic  Affect:  Appropriate  Thought Process:  Goal Directed  Orientation:  Full (Time, Place, and Person)  Thought Content:  WDL  Suicidal Thoughts:  No  Homicidal Thoughts:  No   Memory:  Immediate;   Good Recent;   Good Remote;   Good  Judgement:  Good  Insight:  Good  Psychomotor Activity:  Normal  Concentration:  Concentration: Good and Attention Span: Good  Recall:  Good  Fund of Knowledge:  Good  Language:  Good  Akathisia:  Negative  Handed:  Right  AIMS (if indicated):     Assets:  Communication Skills Desire for Improvement Housing Resilience Social Support Talents/Skills Transportation  ADL's:  Intact  Cognition:  WNL  Sleep:  ok     Assessment/Plan: Anxiety and depression - Plan: buPROPion  (WELLBUTRIN  XL) 300 MG 24 hr tablet  Other insomnia - Plan: zolpidem  (AMBIEN ) 5 MG tablet  Patient is a stable on current medication.  Continue Wellbutrin  XL 300 mg daily and Ambien  5 mg as needed for insomnia.  She also takes melatonin 10 mg.  Discussed medication side effects and benefits.  Recommended to call us  back if she has any question or any concern.  Follow-up in 3 months.   Follow Up Instructions:     I discussed the assessment and treatment plan with the patient. The patient was provided an opportunity to ask questions and all were answered. The patient agreed with the plan and demonstrated an understanding of the instructions.   The patient was advised to call back or seek an in-person evaluation if the symptoms worsen or if the condition fails to improve as anticipated.    Collaboration of Care: Other provider involved in patient's care AEB notes are available in epic to review  Patient/Guardian was advised Release of Information must be obtained prior to any record release in order to collaborate their care with an outside provider. Patient/Guardian was advised if they have not already done so to contact the registration department to sign all necessary forms in order for us  to release information regarding their care.   Consent: Patient/Guardian gives verbal consent for treatment and assignment of benefits for services provided  during this visit. Patient/Guardian expressed understanding and agreed to proceed.     I provided 17 minutes of non face to face time during this encounter.  Note: This document was prepared by [employer] voice dictation technology and any errors that results from this process are unintentional.    Arturo Late, MD 03/08/2023

## 2023-03-31 ENCOUNTER — Other Ambulatory Visit (HOSPITAL_COMMUNITY): Payer: Self-pay | Admitting: Psychiatry

## 2023-03-31 DIAGNOSIS — F419 Anxiety disorder, unspecified: Secondary | ICD-10-CM

## 2023-05-01 ENCOUNTER — Other Ambulatory Visit (HOSPITAL_COMMUNITY): Payer: Self-pay | Admitting: Psychiatry

## 2023-05-01 DIAGNOSIS — F32A Depression, unspecified: Secondary | ICD-10-CM

## 2023-05-07 ENCOUNTER — Encounter (HOSPITAL_COMMUNITY): Payer: Self-pay | Admitting: Psychiatry

## 2023-05-07 ENCOUNTER — Telehealth (HOSPITAL_BASED_OUTPATIENT_CLINIC_OR_DEPARTMENT_OTHER): Admitting: Psychiatry

## 2023-05-07 DIAGNOSIS — F419 Anxiety disorder, unspecified: Secondary | ICD-10-CM | POA: Diagnosis not present

## 2023-05-07 DIAGNOSIS — G4709 Other insomnia: Secondary | ICD-10-CM

## 2023-05-07 DIAGNOSIS — F32A Depression, unspecified: Secondary | ICD-10-CM

## 2023-05-07 MED ORDER — BUPROPION HCL ER (XL) 300 MG PO TB24: 300.0000 mg | ORAL_TABLET | Freq: Every day | ORAL | 2 refills | Status: DC

## 2023-05-07 MED ORDER — ZOLPIDEM TARTRATE 5 MG PO TABS: 5.0000 mg | ORAL_TABLET | Freq: Every day | ORAL | 2 refills | Status: DC

## 2023-05-07 NOTE — Progress Notes (Signed)
 [facility] MD Virtual Progress Note   Patient Location: Work Provider Location: Home Office  I connect with patient by video and verified that I am speaking with correct person by using two identifiers. I discussed the limitations of evaluation and management by telemedicine and the availability of in person appointments. I also discussed with the patient that there may be a patient responsible charge related to this service. The patient expressed understanding and agreed to proceed.  [name] [id number] 38 y.o.  05/07/2023 11:35 AM  History of Present Illness:  Patient is evaluated by video session.  She is at work.  She reported medicine is working and she denies any anxiety, irritability, mood swing, anger, depression or suicidal thoughts.  She sleeps good.  Patient works as a Engineer, civil (consulting) at [employer] and job sometimes very busy and challenging but manageable.  Patient has 41 year old and 40 year old and sometime stressful.  Patient told one of her child is not making good grades and they have decided to stay home on the Christmas break.  Patient lives with her husband.  She also taking Ozempic but does not check her weight on the regular basis.  She feels her weight fluctuates but stable.  She is taking Ambien most of the night it is helping her sleep.  She denies any crying spells or any feeling of hopelessness.  She like to keep the current medication.  Past Psychiatric History: H/O anxiety and depression. Tried Wellbutrin for weight loss, amitriptyline for migraine, hydroxyzine, trazodone and Belsomra for insomnia.  Took Effexor for long time until it stopped working.    Outpatient Encounter Medications as of 05/07/2023  Medication Sig   buPROPion (WELLBUTRIN XL) 300 MG 24 hr tablet Take 1 tablet (300 mg total) by mouth daily.   cetirizine (ZYRTEC) 10 MG tablet Take 10 mg by mouth daily.   Cholecalciferol (VITAMIN D) 125 MCG (5000 UT) CAPS Take 1 capsule by mouth  daily.   fluticasone (FLONASE) 50 MCG/ACT nasal spray Place 2 sprays into both nostrils daily.   Melatonin 10 MG TABS Take 10 mg by mouth at bedtime.    Multiple Vitamin (MULTIVITAMIN) tablet Take 1 tablet by mouth daily.   promethazine-dextromethorphan (PROMETHAZINE-DM) 6.25-15 MG/5ML syrup Take 5 mLs by mouth 4 (four) times daily as needed.   spironolactone (ALDACTONE) 100 MG tablet Take 100 mg by mouth daily.   SPRINTEC 28 0.25-35 MG-MCG tablet Take 1 tablet by mouth daily.   topiramate (TOPAMAX) 50 MG tablet Take 1 tablet (50 mg total) by mouth at bedtime.   zolpidem (AMBIEN) 5 MG tablet Take 1 tablet (5 mg total) by mouth at bedtime.   No facility-administered encounter medications on file as of 05/07/2023.    No results found for this or any previous visit (from the past 2160 hours).   Psychiatric Specialty Exam: Physical Exam  Review of Systems  Weight 286 lb (129.7 kg).There is no height or weight on file to calculate BMI.  General Appearance: Casual  Eye Contact:  Good  Speech:  Clear and Coherent and Normal Rate  Volume:  Normal  Mood:  Euthymic  Affect:  Appropriate  Thought Process:  Goal Directed  Orientation:  Full (Time, Place, and Person)  Thought Content:  Logical  Suicidal Thoughts:  No  Homicidal Thoughts:  No  Memory:  Immediate;   Good Recent;   Good Remote;   Good  Judgement:  Good  Insight:  Good  Psychomotor Activity:  Normal  Concentration:  Concentration:  Good and Attention Span: Good  Recall:  Good  Fund of Knowledge:  Good  Language:  Good  Akathisia:  No  Handed:  Right  AIMS (if indicated):     Assets:  Communication Skills Desire for Improvement Housing Resilience Social Support Talents/Skills Transportation  ADL's:  Intact  Cognition:  WNL  Sleep: Good with Ambien       05/07/2023   11:55 AM 11/12/2022    2:03 PM 11/03/2021   12:39 PM 10/09/2019    2:13 PM 11/01/2018    9:54 AM  Depression screen PHQ 2/9  Decreased Interest 0 3  0 0 2  Down, Depressed, Hopeless 0 0 0 0 2  PHQ - 2 Score 0 3 0 0 4  Altered sleeping 0 3  0 1  Tired, decreased energy 1 2  0 3  Change in appetite 0 0  0 2  Feeling bad or failure about yourself  0 0  0 1  Trouble concentrating 0 1  0 2  Moving slowly or fidgety/restless 0 0  0 0  Suicidal thoughts 0 0  0 0  PHQ-9 Score 1 9  0 13  Difficult doing work/chores Not difficult at all Somewhat difficult  Not difficult at all Not difficult at all    Assessment/Plan: Anxiety and depression - Plan: buPROPion (WELLBUTRIN XL) 300 MG 24 hr tablet  Other insomnia - Plan: zolpidem (AMBIEN) 5 MG tablet  Patient stable on current medication.  She has no concerns or side effects.  Continue Wellbutrin XL 300 mg daily and Ambien 5 mg at bedtime insomnia as needed.  Recommend to call us back if she has any question or any concern.  Follow-up in 3 months   Follow Up Instructions:     I discussed the assessment and treatment plan with the patient. The patient was provided an opportunity to ask questions and all were answered. The patient agreed with the plan and demonstrated an understanding of the instructions.   The patient was advised to call back or seek an in-person evaluation if the symptoms worsen or if the condition fails to improve as anticipated.    Collaboration of Care: Other provider involved in patient's care AEB notes are available in epic to review  Patient/Guardian was advised Release of Information must be obtained prior to any record release in order to collaborate their care with an outside provider. Patient/Guardian was advised if they have not already done so to contact the registration department to sign all necessary forms in order for [place] to release information regarding their care.   Consent: Patient/Guardian gives verbal consent for treatment and assignment of benefits for services provided during this visit. Patient/Guardian expressed understanding and agreed to proceed.      Total encounter time 21 minutes which includes face-to-face time, chart reviewed, care coordination, order entry and documentation during this encounter.   Note: This document was prepared by [employer] voice dictation technology and any errors that results from this process are unintentional.    Cleotis Nipper, MD 05/07/2023

## 2023-05-11 ENCOUNTER — Other Ambulatory Visit

## 2023-05-25 ENCOUNTER — Telehealth: Admitting: Physician Assistant

## 2023-05-25 DIAGNOSIS — J069 Acute upper respiratory infection, unspecified: Secondary | ICD-10-CM | POA: Diagnosis not present

## 2023-05-25 DIAGNOSIS — B9689 Other specified bacterial agents as the cause of diseases classified elsewhere: Secondary | ICD-10-CM | POA: Diagnosis not present

## 2023-05-25 MED ORDER — DOXYCYCLINE HYCLATE 100 MG PO TABS: 100.0000 mg | ORAL_TABLET | Freq: Two times a day (BID) | ORAL | 0 refills | Status: DC

## 2023-05-25 MED ORDER — BENZONATATE 100 MG PO CAPS: 100.0000 mg | ORAL_CAPSULE | Freq: Three times a day (TID) | ORAL | 0 refills | Status: DC | PRN

## 2023-05-25 NOTE — Progress Notes (Signed)
 Virtual Visit Consent   [name], you are scheduled for a virtual visit with a [facility] provider today. Just as with appointments in the office, your consent must be obtained to participate. Your consent will be active for this visit and any virtual visit you may have with one of our providers in the next 365 days. If you have a MyChart account, a copy of this consent can be sent to you electronically.  As this is a virtual visit, video technology does not allow for your provider to perform a traditional examination. This may limit your provider's ability to fully assess your condition. If your provider identifies any concerns that need to be evaluated in person or the need to arrange testing (such as labs, EKG, etc.), we will make arrangements to do so. Although advances in technology are sophisticated, we cannot ensure that it will always work on either your end or our end. If the connection with a video visit is poor, the visit may have to be switched to a telephone visit. With either a video or telephone visit, we are not always able to ensure that we have a secure connection.  By engaging in this virtual visit, you consent to the provision of healthcare and authorize for your insurance to be billed (if applicable) for the services provided during this visit. Depending on your insurance coverage, you may receive a charge related to this service.  I need to obtain your verbal consent now. Are you willing to proceed with your visit today? BIRDA FRYE has provided verbal consent on 05/25/2023 for a virtual visit (video or telephone). [name], [place]  Date: 05/25/2023 1:59 PM   Virtual Visit via Video Note   I, [name], connected with  [name]  ([id number], [birth year]) on 05/25/23 at  2:00 PM EDT by a video-enabled telemedicine application and verified that I am speaking with the correct person using two identifiers.  Location: Patient: Virtual Visit  Location Patient: Home Provider: Virtual Visit Location Provider: Home Office   I discussed the limitations of evaluation and management by telemedicine and the availability of in person appointments. The patient expressed understanding and agreed to proceed.    History of Present Illness: [name] is a 38 y.o. who identifies as a female who was assigned female at birth, and is being seen today for URI symptoms starting over the past week. Endorses initially mainly in her sinuses but moved into her lungs with increased chest congestion and cough that was dry -- then productive of clear phlegm. Now mucous is thick and yellow/green. Using her albuterol  inhaler. Denies chest pain   HPI: HPI  Problems:  Patient Active Problem List   Diagnosis Date Noted   Right shoulder tendinitis 11/08/2021   Insect bite of right arm, initial encounter 11/08/2021   Migraine with aura and without status migrainosus, not intractable 03/19/2021   Pre-diabetes 06/12/2020   Vitamin D  deficiency 06/12/2020   Insulin  resistance 04/18/2019   Class 3 severe obesity with serious comorbidity and body mass index (BMI) of 45.0 to 49.9 in adult [facility]) 04/18/2019   Elevated TSH 07/19/2017   Fatigue 07/19/2017   Encounter for annual physical exam 05/05/2016   Anxiety and depression 01/18/2016   Insomnia 01/18/2016   Chronic migraine 01/18/2016   CARDIOMEGALY 06/13/2009    Allergies: No Known Allergies Medications:  Current Outpatient Medications:    buPROPion  (WELLBUTRIN  XL) 300 MG 24 hr tablet, Take 1 tablet (300 mg  total) by mouth daily., Disp: 30 tablet, Rfl: 2   cetirizine (ZYRTEC) 10 MG tablet, Take 10 mg by mouth daily., Disp: , Rfl:    Cholecalciferol (VITAMIN D ) 125 MCG (5000 UT) CAPS, Take 1 capsule by mouth daily., Disp: 30 capsule, Rfl: 0   fluticasone  (FLONASE ) 50 MCG/ACT nasal spray, Place 2 sprays into both nostrils daily., Disp: 16 g, Rfl: 6   Melatonin 10 MG TABS, Take 10 mg by mouth at  bedtime. , Disp: , Rfl:    Multiple Vitamin (MULTIVITAMIN) tablet, Take 1 tablet by mouth daily., Disp: , Rfl:    spironolactone (ALDACTONE) 100 MG tablet, Take 100 mg by mouth daily., Disp: , Rfl: 4   SPRINTEC 28 0.25-35 MG-MCG tablet, Take 1 tablet by mouth daily., Disp: , Rfl: 4   topiramate  (TOPAMAX ) 50 MG tablet, Take 1 tablet (50 mg total) by mouth at bedtime., Disp: 90 tablet, Rfl: 3   zolpidem  (AMBIEN ) 5 MG tablet, Take 1 tablet (5 mg total) by mouth at bedtime., Disp: 30 tablet, Rfl: 2  Observations/Objective: Patient is well-developed, well-nourished in no acute distress.  Resting comfortably at home.  Head is normocephalic, atraumatic.  No labored breathing. Speech is clear and coherent with logical content.  Patient is alert and oriented at baseline.   Assessment and Plan: 1. Bacterial URI (Primary)  Rx Doxycycline .  Increase fluids.  Rest.  Saline nasal spray.  Probiotic.  Mucinex  as directed.  Humidifier in bedroom. Tessalon  per orders. Ok to use Albuterol  when needed. Call or return to clinic if symptoms are not improving.   Follow Up Instructions: I discussed the assessment and treatment plan with the patient. The patient was provided an opportunity to ask questions and all were answered. The patient agreed with the plan and demonstrated an understanding of the instructions.  A copy of instructions were sent to the patient via MyChart unless otherwise noted below.   The patient was advised to call back or seek an in-person evaluation if the symptoms worsen or if the condition fails to improve as anticipated.    Samantha Maillard, PA-C

## 2023-05-25 NOTE — Patient Instructions (Signed)
 [name], thank you for joining [name], PA-C for today's virtual visit.  While this provider is not your primary care provider (PCP), if your PCP is located in our provider database this encounter information will be shared with them immediately following your visit.   A [facility] [place] MyChart account gives you access to today's visit and all your visits, tests, and labs performed at [facility] " click here if you don't have a [facility] MyChart account or go to [email or web address]  Consent: (Patient) [name] provided verbal consent for this virtual visit at the beginning of the encounter.  Current Medications:  Current Outpatient Medications:    benzonatate  (TESSALON ) 100 MG capsule, Take 1 capsule (100 mg total) by mouth 3 (three) times daily as needed for cough., Disp: 30 capsule, Rfl: 0   buPROPion  (WELLBUTRIN  XL) 300 MG 24 hr tablet, Take 1 tablet (300 mg total) by mouth daily., Disp: 30 tablet, Rfl: 2   cetirizine (ZYRTEC) 10 MG tablet, Take 10 mg by mouth daily., Disp: , Rfl:    Cholecalciferol (VITAMIN D ) 125 MCG (5000 UT) CAPS, Take 1 capsule by mouth daily., Disp: 30 capsule, Rfl: 0   doxycycline  (VIBRA -TABS) 100 MG tablet, Take 1 tablet (100 mg total) by mouth 2 (two) times daily., Disp: 14 tablet, Rfl: 0   fluticasone  (FLONASE ) 50 MCG/ACT nasal spray, Place 2 sprays into both nostrils daily., Disp: 16 g, Rfl: 6   Melatonin 10 MG TABS, Take 10 mg by mouth at bedtime. , Disp: , Rfl:    Multiple Vitamin (MULTIVITAMIN) tablet, Take 1 tablet by mouth daily., Disp: , Rfl:    spironolactone (ALDACTONE) 100 MG tablet, Take 100 mg by mouth daily., Disp: , Rfl: 4   SPRINTEC 28 0.25-35 MG-MCG tablet, Take 1 tablet by mouth daily., Disp: , Rfl: 4   topiramate  (TOPAMAX ) 50 MG tablet, Take 1 tablet (50 mg total) by mouth at bedtime., Disp: 90 tablet, Rfl: 3   zolpidem  (AMBIEN ) 5 MG tablet, Take 1 tablet (5 mg total) by mouth at bedtime., Disp: 30  tablet, Rfl: 2   Medications ordered in this encounter:  Meds ordered this encounter  Medications   benzonatate  (TESSALON ) 100 MG capsule    Sig: Take 1 capsule (100 mg total) by mouth 3 (three) times daily as needed for cough.    Dispense:  30 capsule    Refill:  0    Supervising Provider:   Corine Dice [[id number]]   doxycycline  (VIBRA -TABS) 100 MG tablet    Sig: Take 1 tablet (100 mg total) by mouth 2 (two) times daily.    Dispense:  14 tablet    Refill:  0    Supervising Provider:   Corine Dice [[id number]]     *If you need refills on other medications prior to your next appointment, please contact your pharmacy*  Follow-Up: Call back or seek an in-person evaluation if the symptoms worsen or if the condition fails to improve as anticipated.  [facility] [phone]  Other Instructions Take antibiotic (Doxycycline ) as directed.  Increase fluids.  Get plenty of rest. Use Mucinex  for congestion. Tessalon  for cough. Ok to use your albuterol  as directed. Take a daily probiotic (I recommend Align or Culturelle, but even Activia Yogurt may be beneficial).  A humidifier placed in the bedroom may offer some relief for a dry, scratchy throat of nasal irritation.  Read information below on acute bronchitis. Please call or return to clinic  if symptoms are not improving.  Acute Bronchitis Bronchitis is when the airways that extend from the windpipe into the lungs get red, puffy, and painful (inflamed). Bronchitis often causes thick spit (mucus) to develop. This leads to a cough. A cough is the most common symptom of bronchitis. In acute bronchitis, the condition usually begins suddenly and goes away over time (usually in 2 weeks). Smoking, allergies, and asthma can make bronchitis worse. Repeated episodes of bronchitis may cause more lung problems.  HOME CARE Rest. Drink enough fluids to keep your pee (urine) clear or pale yellow (unless you need to limit fluids as told  by your doctor). Only take over-the-counter or prescription medicines as told by your doctor. Avoid smoking and secondhand smoke. These can make bronchitis worse. If you are a smoker, think about using nicotine gum or skin patches. Quitting smoking will help your lungs heal faster. Reduce the chance of getting bronchitis again by: Washing your hands often. Avoiding people with cold symptoms. Trying not to touch your hands to your mouth, nose, or eyes. Follow up with your doctor as told.  GET HELP IF: Your symptoms do not improve after 1 week of treatment. Symptoms include: Cough. Fever. Coughing up thick spit. Body aches. Chest congestion. Chills. Shortness of breath. Sore throat.  GET HELP RIGHT AWAY IF:  You have an increased fever. You have chills. You have severe shortness of breath. You have bloody thick spit (sputum). You throw up (vomit) often. You lose too much body fluid (dehydration). You have a severe headache. You faint.  MAKE SURE YOU:  Understand these instructions. Will watch your condition. Will get help right away if you are not doing well or get worse. Document Released: 07/01/2007 Document Revised: 09/14/2012 Document Reviewed: 07/05/2012 [facility] Patient Information 2015 [place], [place]. This information is not intended to replace advice given to you by your health care provider. Make sure you discuss any questions you have with your health care provider.    If you have been instructed to have an in-person evaluation today at a local Urgent Care facility, please use the link below. It will take you to a list of all of our available [facility], including address, phone number and hours of operation. Please do not delay care.  [facility]  If you or a family member do not have a primary care provider, use the link below to schedule a visit and establish care. When you choose a [facility] primary care physician or advanced  practice provider, you gain a long-term partner in health. Find a Primary Care Provider  Learn more about 's in-office and virtual care options:  - Get Care Now

## 2023-06-09 ENCOUNTER — Other Ambulatory Visit (HOSPITAL_COMMUNITY): Payer: Self-pay | Admitting: Psychiatry

## 2023-06-09 DIAGNOSIS — F32A Depression, unspecified: Secondary | ICD-10-CM

## 2023-07-08 ENCOUNTER — Telehealth: Admitting: Physician Assistant

## 2023-07-08 DIAGNOSIS — J069 Acute upper respiratory infection, unspecified: Secondary | ICD-10-CM

## 2023-07-08 MED ORDER — AMOXICILLIN-POT CLAVULANATE 875-125 MG PO TABS: 1.0000 | ORAL_TABLET | Freq: Two times a day (BID) | ORAL | 0 refills | Status: DC

## 2023-07-08 MED ORDER — PREDNISONE 10 MG (21) PO TBPK: ORAL_TABLET | ORAL | 0 refills | Status: DC

## 2023-07-08 MED ORDER — BENZONATATE 100 MG PO CAPS: 100.0000 mg | ORAL_CAPSULE | Freq: Three times a day (TID) | ORAL | 0 refills | Status: DC | PRN

## 2023-07-08 NOTE — Progress Notes (Signed)
 Virtual Visit Consent   [name], you are scheduled for a virtual visit with a [facility] provider today. Just as with appointments in the office, your consent must be obtained to participate. Your consent will be active for this visit and any virtual visit you may have with one of our providers in the next 365 days. If you have a MyChart account, a copy of this consent can be sent to you electronically.  As this is a virtual visit, video technology does not allow for your provider to perform a traditional examination. This may limit your provider's ability to fully assess your condition. If your provider identifies any concerns that need to be evaluated in person or the need to arrange testing (such as labs, EKG, etc.), we will make arrangements to do so. Although advances in technology are sophisticated, we cannot ensure that it will always work on either your end or our end. If the connection with a video visit is poor, the visit may have to be switched to a telephone visit. With either a video or telephone visit, we are not always able to ensure that we have a secure connection.  By engaging in this virtual visit, you consent to the provision of healthcare and authorize for your insurance to be billed (if applicable) for the services provided during this visit. Depending on your insurance coverage, you may receive a charge related to this service.  I need to obtain your verbal consent now. Are you willing to proceed with your visit today? BREIONA COUVILLON has provided verbal consent on 07/08/2023 for a virtual visit (video or telephone). [name], [place]  Date: 07/08/2023 3:00 PM   Virtual Visit via Video Note   I, [name], connected with  [name]  ([id number], 08/10/1985) on 07/08/23 at  2:30 PM EDT by a video-enabled telemedicine application and verified that I am speaking with the correct person using two identifiers.  Location: Patient: Virtual Visit  Location Patient: Home Provider: Virtual Visit Location Provider: Home Office   I discussed the limitations of evaluation and management by telemedicine and the availability of in person appointments. The patient expressed understanding and agreed to proceed.    History of Present Illness: [name] is a 38 y.o. who identifies as a female who was assigned female at birth, and is being seen today for URI symptoms that have been waxing and waning but worse over rthe past 4 days. Notes initially with scratchy throat and some cough, now with congestion and cough productive of thick yellow phlegm. Notes fever up to 101 for 2 days. Improved today but is rotating antipyretics. Denies chest pain or SOB. Denies any substantial head congestion. Denies SOB.    HPI: HPI  Problems:  Patient Active Problem List   Diagnosis Date Noted   Right shoulder tendinitis 11/08/2021   Insect bite of right arm, initial encounter 11/08/2021   Migraine with aura and without status migrainosus, not intractable 03/19/2021   Pre-diabetes 06/12/2020   Vitamin D  deficiency 06/12/2020   Insulin  resistance 04/18/2019   Class 3 severe obesity with serious comorbidity and body mass index (BMI) of 45.0 to 49.9 in adult 04/18/2019   Elevated TSH 07/19/2017   Fatigue 07/19/2017   Encounter for annual physical exam 05/05/2016   Anxiety and depression 01/18/2016   Insomnia 01/18/2016   Chronic migraine 01/18/2016   CARDIOMEGALY 06/13/2009    Allergies: No Known Allergies Medications:  Current Outpatient Medications:  amoxicillin -clavulanate (AUGMENTIN ) 875-125 MG tablet, Take 1 tablet by mouth 2 (two) times daily., Disp: 14 tablet, Rfl: 0   benzonatate  (TESSALON ) 100 MG capsule, Take 1 capsule (100 mg total) by mouth 3 (three) times daily as needed for cough., Disp: 30 capsule, Rfl: 0   predniSONE  (STERAPRED UNI-PAK 21 TAB) 10 MG (21) TBPK tablet, Take following package directions, Disp: 21 tablet, Rfl: 0    buPROPion  (WELLBUTRIN  XL) 300 MG 24 hr tablet, Take 1 tablet (300 mg total) by mouth daily., Disp: 30 tablet, Rfl: 2   cetirizine (ZYRTEC) 10 MG tablet, Take 10 mg by mouth daily., Disp: , Rfl:    Cholecalciferol (VITAMIN D ) 125 MCG (5000 UT) CAPS, Take 1 capsule by mouth daily., Disp: 30 capsule, Rfl: 0   fluticasone  (FLONASE ) 50 MCG/ACT nasal spray, Place 2 sprays into both nostrils daily., Disp: 16 g, Rfl: 6   Melatonin 10 MG TABS, Take 10 mg by mouth at bedtime. , Disp: , Rfl:    Multiple Vitamin (MULTIVITAMIN) tablet, Take 1 tablet by mouth daily., Disp: , Rfl:    spironolactone (ALDACTONE) 100 MG tablet, Take 100 mg by mouth daily., Disp: , Rfl: 4   SPRINTEC 28 0.25-35 MG-MCG tablet, Take 1 tablet by mouth daily., Disp: , Rfl: 4   topiramate  (TOPAMAX ) 50 MG tablet, Take 1 tablet (50 mg total) by mouth at bedtime., Disp: 90 tablet, Rfl: 3   zolpidem  (AMBIEN ) 5 MG tablet, Take 1 tablet (5 mg total) by mouth at bedtime., Disp: 30 tablet, Rfl: 2  Observations/Objective: Patient is well-developed, well-nourished in no acute distress.  Resting comfortably  at home.  Head is normocephalic, atraumatic.  No labored breathing.  Speech is clear and coherent with logical content.  Patient is alert and oriented at baseline.    Assessment and Plan: 1. Upper respiratory tract infection, unspecified type (Primary) - benzonatate  (TESSALON ) 100 MG capsule; Take 1 capsule (100 mg total) by mouth 3 (three) times daily as needed for cough.  Dispense: 30 capsule; Refill: 0 - predniSONE  (STERAPRED UNI-PAK 21 TAB) 10 MG (21) TBPK tablet; Take following package directions  Dispense: 21 tablet; Refill: 0 - amoxicillin -clavulanate (AUGMENTIN ) 875-125 MG tablet; Take 1 tablet by mouth 2 (two) times daily.  Dispense: 14 tablet; Refill: 0  Discussed viral versus bacterial causes. Giving new onset of fever now, will add on Augmentin  to cover for bacterial cause. Tessalon  and prednisone  per orders. Continue use of  Flonase  OTC.   Follow Up Instructions: I discussed the assessment and treatment plan with the patient. The patient was provided an opportunity to ask questions and all were answered. The patient agreed with the plan and demonstrated an understanding of the instructions.  A copy of instructions were sent to the patient via MyChart unless otherwise noted below.    The patient was advised to call back or seek an in-person evaluation if the symptoms worsen or if the condition fails to improve as anticipated.    Samantha Maillard, PA-C

## 2023-07-08 NOTE — Patient Instructions (Signed)
 [name], thank you for joining [name], PA-C for today's virtual visit.  While this provider is not your primary care provider (PCP), if your PCP is located in our provider database this encounter information will be shared with them immediately following your visit.   [name] MyChart account gives you access to today's visit and all your visits, tests, and labs performed at [facility]  click here if you don't have a [facility] MyChart account or go to [email or web address]  Consent: (Patient) [name] provided verbal consent for this virtual visit at the beginning of the encounter.  Current Medications:  Current Outpatient Medications:    amoxicillin -clavulanate (AUGMENTIN ) 875-125 MG tablet, Take 1 tablet by mouth 2 (two) times daily., Disp: 14 tablet, Rfl: 0   benzonatate  (TESSALON ) 100 MG capsule, Take 1 capsule (100 mg total) by mouth 3 (three) times daily as needed for cough., Disp: 30 capsule, Rfl: 0   predniSONE  (STERAPRED UNI-PAK 21 TAB) 10 MG (21) TBPK tablet, Take following package directions, Disp: 21 tablet, Rfl: 0   buPROPion  (WELLBUTRIN  XL) 300 MG 24 hr tablet, Take 1 tablet (300 mg total) by mouth daily., Disp: 30 tablet, Rfl: 2   cetirizine (ZYRTEC) 10 MG tablet, Take 10 mg by mouth daily., Disp: , Rfl:    Cholecalciferol (VITAMIN D ) 125 MCG (5000 UT) CAPS, Take 1 capsule by mouth daily., Disp: 30 capsule, Rfl: 0   fluticasone  (FLONASE ) 50 MCG/ACT nasal spray, Place 2 sprays into both nostrils daily., Disp: 16 g, Rfl: 6   Melatonin 10 MG TABS, Take 10 mg by mouth at bedtime. , Disp: , Rfl:    Multiple Vitamin (MULTIVITAMIN) tablet, Take 1 tablet by mouth daily., Disp: , Rfl:    spironolactone (ALDACTONE) 100 MG tablet, Take 100 mg by mouth daily., Disp: , Rfl: 4   SPRINTEC 28 0.25-35 MG-MCG tablet, Take 1 tablet by mouth daily., Disp: , Rfl: 4   topiramate  (TOPAMAX ) 50 MG tablet, Take 1 tablet (50 mg total) by mouth at  bedtime., Disp: 90 tablet, Rfl: 3   zolpidem  (AMBIEN ) 5 MG tablet, Take 1 tablet (5 mg total) by mouth at bedtime., Disp: 30 tablet, Rfl: 2   Medications ordered in this encounter:  Meds ordered this encounter  Medications   benzonatate  (TESSALON ) 100 MG capsule    Sig: Take 1 capsule (100 mg total) by mouth 3 (three) times daily as needed for cough.    Dispense:  30 capsule    Refill:  0    Supervising Provider:   Corine Dice [[id number]]   predniSONE  (STERAPRED UNI-PAK 21 TAB) 10 MG (21) TBPK tablet    Sig: Take following package directions    Dispense:  21 tablet    Refill:  0    Supervising Provider:   LAMPTEY, PHILIP O [[id number]]   amoxicillin -clavulanate (AUGMENTIN ) 875-125 MG tablet    Sig: Take 1 tablet by mouth 2 (two) times daily.    Dispense:  14 tablet    Refill:  0    Supervising Provider:   Corine Dice [[id number]]     *If you need refills on other medications prior to your next appointment, please contact your pharmacy*  Follow-Up: Call back or seek an in-person evaluation if the symptoms worsen or if the condition fails to improve as anticipated.  [facility] [phone]  Other Instructions Please hydrate and rest. Start Mucinex  OTC to thin congestion. Please take the prescribed medications as  directed. If symptoms are not resolving, or any worsening symptoms despite treatment, please seek an in-person evaluation.   If you have been instructed to have an in-person evaluation today at a local Urgent Care facility, please use the link below. It will take you to a list of all of our available [facility], including address, phone number and hours of operation. Please do not delay care.  [facility]  If you or a family member do not have a primary care provider, use the link below to schedule a visit and establish care. When you choose a [facility] primary care physician or advanced practice provider, you gain a  long-term partner in health. Find a Primary Care Provider  Learn more about [facility] in-office and virtual care options: [facility] - Get Care Now

## 2023-07-11 ENCOUNTER — Other Ambulatory Visit: Payer: Self-pay

## 2023-07-11 ENCOUNTER — Ambulatory Visit: Admission: EM | Admit: 2023-07-11 | Discharge: 2023-07-11 | Disposition: A | Discharge status: Home / Self Care

## 2023-07-11 DIAGNOSIS — S86012A Strain of left Achilles tendon, initial encounter: Secondary | ICD-10-CM | POA: Diagnosis not present

## 2023-07-11 NOTE — ED Provider Notes (Signed)
 [facility]    CSN: [id number] Arrival date & time: 07/11/23  1334      History   Chief Complaint Chief Complaint  Patient presents with   Foot Injury    HPI [name] is a 38 y.o. female.  Marvell Slider forward at work today and landed on the knees.  She tried to stand using left leg, applying weight she heard a pop.  Pain in the posterior ankle.  At first it seemed to radiate up into the calf. Denies any prior injury of this leg No interventions yet as this just occurred  Past Medical History:  Diagnosis Date   Allergy    Anxiety    Back pain    Constipation    Depression    Dyspnea    Gallbladder problem    GERD (gastroesophageal reflux disease)    IBS (irritable bowel syndrome)    Interstitial cystitis    Lactose intolerance    Migraine    Overactive bladder    Palpitations    PCOS (polycystic ovarian syndrome)    Pelvic pain    Pseudotumor cerebri    Pseudotumor cerebri    SUI (stress urinary incontinence, female)    Urinary incontinence     Patient Active Problem List   Diagnosis Date Noted   Right shoulder tendinitis 11/08/2021   Insect bite of right arm, initial encounter 11/08/2021   Migraine with aura and without status migrainosus, not intractable 03/19/2021   Pre-diabetes 06/12/2020   Vitamin D  deficiency 06/12/2020   Insulin  resistance 04/18/2019   Class 3 severe obesity with serious comorbidity and body mass index (BMI) of 45.0 to 49.9 in adult 04/18/2019   Elevated TSH 07/19/2017   Fatigue 07/19/2017   Encounter for annual physical exam 05/05/2016   Anxiety and depression 01/18/2016   Insomnia 01/18/2016   Chronic migraine 01/18/2016   CARDIOMEGALY 06/13/2009    Past Surgical History:  Procedure Laterality Date   CESAREAN SECTION  2009  &  2011   2011 W/ BILATERAL TUBAL LIGATION   CHOLECYSTECTOMY  2010   CYSTO WITH HYDRODISTENSION  03/01/2012   Procedure: CYSTOSCOPY/HYDRODISTENSION;  Surgeon: Devorah Fonder, MD;   Location: [facility];  Service: Urology;  Laterality: N/A;  INSTILLATION of marcaine  and pyridium .   PUBOVAGINAL SLING N/A 02/14/2013   Procedure: PUBO-VAGINAL SLING AND HYDRODISTENSION;  Surgeon: Devorah Fonder, MD;  Location: [facility];  Service: Urology;  Laterality: N/A;   TRANSTHORACIC ECHOCARDIOGRAM  06/2009   TUBAL LIGATION      OB History     Gravida  3   Para      Term      Preterm      AB  1   Living  2      SAB  1   IAB      Ectopic      Multiple      Live Births               Home Medications    Prior to Admission medications   Medication Sig Start Date End Date Taking? Authorizing Provider  amoxicillin -clavulanate (AUGMENTIN ) 875-125 MG tablet Take 1 tablet by mouth 2 (two) times daily. 07/08/23   Farris Hong, PA-C  benzonatate  (TESSALON ) 100 MG capsule Take 1 capsule (100 mg total) by mouth 3 (three) times daily as needed for cough. 07/08/23   Farris Hong, PA-C  buPROPion  (WELLBUTRIN  XL) 300 MG 24 hr tablet Take  1 tablet (300 mg total) by mouth daily. 05/07/23   Arfeen, Bronson Canny, MD  cetirizine (ZYRTEC) 10 MG tablet Take 10 mg by mouth daily.    [provider]  Cholecalciferol (VITAMIN D ) 125 MCG (5000 UT) CAPS Take 1 capsule by mouth daily. 06/11/20   Whitmire, Dawn, FNP  fluticasone  (FLONASE ) 50 MCG/ACT nasal spray Place 2 sprays into both nostrils daily. 04/20/18   Tommas Fragmin A, FNP  Melatonin 10 MG TABS Take 10 mg by mouth at bedtime.     [provider]  Multiple Vitamin (MULTIVITAMIN) tablet Take 1 tablet by mouth daily.    [provider]  predniSONE  (STERAPRED UNI-PAK 21 TAB) 10 MG (21) TBPK tablet Take following package directions 07/08/23   Farris Hong, PA-C  spironolactone (ALDACTONE) 100 MG tablet Take 100 mg by mouth daily. 12/03/15   [provider]  SPRINTEC 28 0.25-35 MG-MCG tablet Take 1 tablet by mouth daily. 12/01/15   [provider]   topiramate  (TOPAMAX ) 50 MG tablet Take 1 tablet (50 mg total) by mouth at bedtime. 07/24/22   Allwardt, Alyssa M, PA-C  zolpidem  (AMBIEN ) 5 MG tablet Take 1 tablet (5 mg total) by mouth at bedtime. 05/07/23   Arfeen, Bronson Canny, MD    Family History Family History  Problem Relation Age of Onset   Hypertension Mother    Depression Mother    Diabetes Mother    Anxiety disorder Mother    Sleep apnea Mother    Obesity Mother    Hypertension Father    Cancer Father        kidney   Depression Father    Hyperlipidemia Father    Kidney cancer Father    Alcoholism Father    Obesity Father    Hypertension Maternal Grandmother    Cancer Maternal Grandmother        kidney, bone   Hypertension Maternal Grandfather    Cancer Maternal Grandfather        lung, bone   Stroke Maternal Grandfather    Hypertension Paternal Grandmother    Hypertension Paternal Grandfather    Stroke Paternal Grandfather    Cancer Maternal Uncle        stomach    Social History Social History   Tobacco Use   Smoking status: Never   Smokeless tobacco: Never  Vaping Use   Vaping status: Never Used  Substance Use Topics   Alcohol use: No   Drug use: No     Allergies   Patient has no known allergies.   Review of Systems Review of Systems As per HPI   Physical Exam Triage Vital Signs ED Triage Vitals  Encounter Vitals Group     BP 07/11/23 1401 126/86     Girls Systolic BP Percentile --      Girls Diastolic BP Percentile --      Boys Systolic BP Percentile --      Boys Diastolic BP Percentile --      Pulse Rate 07/11/23 1401 95     Resp 07/11/23 1401 16     Temp 07/11/23 1401 99.2 F (37.3 C)     Temp Source 07/11/23 1401 Oral     SpO2 07/11/23 1401 95 %     Weight --      Height --      Head Circumference --      Peak Flow --      Pain Score 07/11/23 1359 6     Pain  Loc --      Pain Education --      Exclude from Growth Chart --    No data found.  Updated Vital Signs BP 126/86    Pulse 95   Temp 99.2 F (37.3 C) (Oral)   Resp 16   LMP 07/07/2023   SpO2 95%    Physical Exam Vitals and nursing note reviewed.  Constitutional:      General: She is not in acute distress. HENT:     Mouth/Throat:     Pharynx: Oropharynx is clear.   Cardiovascular:     Rate and Rhythm: Normal rate and regular rhythm.     Pulses: Normal pulses.     Heart sounds: Normal heart sounds.  Pulmonary:     Effort: Pulmonary effort is normal.     Breath sounds: Normal breath sounds.   Musculoskeletal:     Cervical back: Normal range of motion.     Right ankle:     Right Achilles Tendon: Normal.     Left ankle:     Left Achilles Tendon: Tenderness present. Thompson's test positive.     Left foot: Normal capillary refill. No bony tenderness. Normal pulse.     Comments: +thompson test left. Tender over achilles with deformity palpated. No bony tenderness of ankles or midfoot. No bony tenderness tibia. Distal sensation intact. Strong DP pulse. Cap refill < 2 seconds   Skin:    General: Skin is warm and dry.     Capillary Refill: Capillary refill takes less than 2 seconds.     Findings: No bruising or erythema.   Neurological:     Mental Status: She is alert and oriented to person, place, and time.     UC Treatments / Results  Labs (all labs ordered are listed, but only abnormal results are displayed) Labs Reviewed - No data to display  EKG   Radiology No results found.  Procedures Procedures (including critical care time)  Medications Ordered in UC Medications - No data to display  Initial Impression / Assessment and Plan / UC Course  I have reviewed the triage vital signs and the nursing notes.  Pertinent labs & imaging results that were available during my care of the patient were reviewed by me and considered in my medical decision making (see chart for details).  Stable vitals Declines ibuprofen  dose in clinic, will take at home Unfortunately she may have  ruptured her Achilles tendon Positive Thompson test.  There is no bony tenderness of foot or ankle.  However I did still offer an x-ray.  Shared decision making we will defer at this time.  CAM boot is provided for support.  She will follow-up with orthopedics in the morning. Pain control discussed in the meantime.   Also advised contacting employee health and wellness to discuss work restrictions and further [employer] if needed. A note is provided for work.   Final Clinical Impressions(s) / UC Diagnoses   Final diagnoses:  Achilles tendon rupture, left, initial encounter     Discharge Instructions      Please wear the walking boot for support Ibuprofen  and/or tylenol  for pain control Follow up with orthopedics!!  Contact your HR department to discuss workers compensation. You can also call the clinic listed above (health & wellness)    ED Prescriptions   None    PDMP not reviewed this encounter.   Dion Parrow, Ivette Marks, [place] 07/11/23 1502

## 2023-07-11 NOTE — ED Triage Notes (Signed)
 Pt states she fell at work and injured the posterior of left foot today. Pt states the pain radiates from posterior of left foot up to calf area. Pt limped to exam room

## 2023-07-11 NOTE — Discharge Instructions (Addendum)
 Please wear the walking boot for support Ibuprofen  and/or tylenol  for pain control Follow up with orthopedics!!  Contact your HR department to discuss workers compensation. You can also call the clinic listed above (health & wellness)

## 2023-07-12 DIAGNOSIS — S86012A Strain of left Achilles tendon, initial encounter: Secondary | ICD-10-CM | POA: Insufficient documentation

## 2023-07-21 ENCOUNTER — Other Ambulatory Visit: Payer: Self-pay | Admitting: Orthopedic Surgery

## 2023-07-21 ENCOUNTER — Encounter: Payer: Self-pay | Admitting: Physician Assistant

## 2023-07-21 ENCOUNTER — Other Ambulatory Visit: Payer: Self-pay

## 2023-07-21 ENCOUNTER — Encounter (HOSPITAL_BASED_OUTPATIENT_CLINIC_OR_DEPARTMENT_OTHER)
Admission: RE | Admit: 2023-07-21 | Discharge: 2023-07-21 | Disposition: A | Discharge status: Home / Self Care | Source: Ambulatory Visit | Attending: Orthopedic Surgery | Admitting: Orthopedic Surgery

## 2023-07-21 ENCOUNTER — Encounter (HOSPITAL_BASED_OUTPATIENT_CLINIC_OR_DEPARTMENT_OTHER): Payer: Self-pay | Admitting: Orthopedic Surgery

## 2023-07-21 DIAGNOSIS — R519 Headache, unspecified: Secondary | ICD-10-CM | POA: Diagnosis not present

## 2023-07-21 DIAGNOSIS — Z6841 Body Mass Index (BMI) 40.0 and over, adult: Secondary | ICD-10-CM | POA: Diagnosis not present

## 2023-07-21 DIAGNOSIS — M9262 Juvenile osteochondrosis of tarsus, left ankle: Secondary | ICD-10-CM | POA: Diagnosis not present

## 2023-07-21 DIAGNOSIS — R059 Cough, unspecified: Secondary | ICD-10-CM | POA: Diagnosis not present

## 2023-07-21 DIAGNOSIS — M6702 Short Achilles tendon (acquired), left ankle: Secondary | ICD-10-CM | POA: Diagnosis not present

## 2023-07-21 DIAGNOSIS — E66813 Obesity, class 3: Secondary | ICD-10-CM | POA: Diagnosis not present

## 2023-07-21 DIAGNOSIS — S86012A Strain of left Achilles tendon, initial encounter: Secondary | ICD-10-CM | POA: Diagnosis present

## 2023-07-21 DIAGNOSIS — F32A Depression, unspecified: Secondary | ICD-10-CM | POA: Diagnosis not present

## 2023-07-21 DIAGNOSIS — F419 Anxiety disorder, unspecified: Secondary | ICD-10-CM | POA: Diagnosis not present

## 2023-07-21 DIAGNOSIS — E282 Polycystic ovarian syndrome: Secondary | ICD-10-CM | POA: Diagnosis not present

## 2023-07-21 DIAGNOSIS — G932 Benign intracranial hypertension: Secondary | ICD-10-CM | POA: Diagnosis not present

## 2023-07-21 DIAGNOSIS — N319 Neuromuscular dysfunction of bladder, unspecified: Secondary | ICD-10-CM | POA: Diagnosis not present

## 2023-07-21 DIAGNOSIS — X58XXXA Exposure to other specified factors, initial encounter: Secondary | ICD-10-CM | POA: Diagnosis not present

## 2023-07-21 DIAGNOSIS — R7303 Prediabetes: Secondary | ICD-10-CM | POA: Diagnosis not present

## 2023-07-21 DIAGNOSIS — K219 Gastro-esophageal reflux disease without esophagitis: Secondary | ICD-10-CM | POA: Diagnosis not present

## 2023-07-21 LAB — BASIC METABOLIC PANEL WITH GFR
Anion gap: 10 (ref 5–15)
BUN: 11 mg/dL (ref 6–20)
CO2: 18 mmol/L — ABNORMAL LOW (ref 22–32)
Calcium: 8.6 mg/dL — ABNORMAL LOW (ref 8.9–10.3)
Chloride: 110 mmol/L (ref 98–111)
Creatinine, Ser: 0.82 mg/dL (ref 0.44–1.00)
GFR, Estimated: >60 mL/min (ref >60)
Glucose, Bld: 85 mg/dL (ref 70–99)
Potassium: 4.3 mmol/L (ref 3.5–5.1)
Sodium: 138 mmol/L (ref 135–145)

## 2023-07-21 NOTE — Progress Notes (Signed)
   07/21/23 1050  PAT Phone Screen  Is the patient taking a GLP-1 receptor agonist? No  Do You Have Diabetes? No  Do You Have Hypertension? No  Have You Ever Been to the ER for Asthma? No  Have You Taken Oral Steroids in the Past 3 Months? (S)  Yes (just completed dose pack for uri on 6/12. PT denies any cough or fever at time of call.  D/W Dr Cleotilde, ok to proceed as planned w/ surgery tomorrow.)  Do you Take Phenteramine or any Other Diet Drugs? No  Recent  Lab Work, EKG, CXR? No  Do you have a history of heart problems? No  Any Recent Hospitalizations? No  Height 5' 4 (1.626 m)  Weight 127 kg  Pat Appointment Scheduled Yes ([facility])

## 2023-07-21 NOTE — Progress Notes (Signed)

## 2023-07-22 ENCOUNTER — Other Ambulatory Visit: Payer: Self-pay

## 2023-07-22 ENCOUNTER — Ambulatory Visit (HOSPITAL_BASED_OUTPATIENT_CLINIC_OR_DEPARTMENT_OTHER): Payer: Worker's Compensation | Admitting: Anesthesiology

## 2023-07-22 ENCOUNTER — Ambulatory Visit (HOSPITAL_BASED_OUTPATIENT_CLINIC_OR_DEPARTMENT_OTHER)
Admission: RE | Admit: 2023-07-22 | Discharge: 2023-07-22 | Disposition: A | Discharge status: Home / Self Care | Payer: Worker's Compensation | Attending: Orthopedic Surgery | Admitting: Orthopedic Surgery

## 2023-07-22 ENCOUNTER — Encounter (HOSPITAL_BASED_OUTPATIENT_CLINIC_OR_DEPARTMENT_OTHER): Payer: Self-pay | Admitting: Orthopedic Surgery

## 2023-07-22 ENCOUNTER — Encounter (HOSPITAL_BASED_OUTPATIENT_CLINIC_OR_DEPARTMENT_OTHER)
Admission: RE | Disposition: A | Discharge status: Home / Self Care | Payer: Self-pay | Source: Home / Self Care | Attending: Orthopedic Surgery

## 2023-07-22 DIAGNOSIS — S86012A Strain of left Achilles tendon, initial encounter: Secondary | ICD-10-CM | POA: Diagnosis not present

## 2023-07-22 DIAGNOSIS — X58XXXA Exposure to other specified factors, initial encounter: Secondary | ICD-10-CM | POA: Insufficient documentation

## 2023-07-22 DIAGNOSIS — F418 Other specified anxiety disorders: Secondary | ICD-10-CM

## 2023-07-22 DIAGNOSIS — Z6841 Body Mass Index (BMI) 40.0 and over, adult: Secondary | ICD-10-CM

## 2023-07-22 DIAGNOSIS — M6702 Short Achilles tendon (acquired), left ankle: Secondary | ICD-10-CM | POA: Insufficient documentation

## 2023-07-22 DIAGNOSIS — M9262 Juvenile osteochondrosis of tarsus, left ankle: Secondary | ICD-10-CM | POA: Insufficient documentation

## 2023-07-22 DIAGNOSIS — R7303 Prediabetes: Secondary | ICD-10-CM | POA: Insufficient documentation

## 2023-07-22 DIAGNOSIS — Z01818 Encounter for other preprocedural examination: Secondary | ICD-10-CM

## 2023-07-22 DIAGNOSIS — E66813 Obesity, class 3: Secondary | ICD-10-CM | POA: Diagnosis not present

## 2023-07-22 DIAGNOSIS — F32A Depression, unspecified: Secondary | ICD-10-CM | POA: Insufficient documentation

## 2023-07-22 DIAGNOSIS — K219 Gastro-esophageal reflux disease without esophagitis: Secondary | ICD-10-CM | POA: Insufficient documentation

## 2023-07-22 DIAGNOSIS — R519 Headache, unspecified: Secondary | ICD-10-CM | POA: Insufficient documentation

## 2023-07-22 DIAGNOSIS — F419 Anxiety disorder, unspecified: Secondary | ICD-10-CM | POA: Insufficient documentation

## 2023-07-22 DIAGNOSIS — R059 Cough, unspecified: Secondary | ICD-10-CM | POA: Insufficient documentation

## 2023-07-22 DIAGNOSIS — G932 Benign intracranial hypertension: Secondary | ICD-10-CM | POA: Insufficient documentation

## 2023-07-22 DIAGNOSIS — N319 Neuromuscular dysfunction of bladder, unspecified: Secondary | ICD-10-CM | POA: Insufficient documentation

## 2023-07-22 DIAGNOSIS — Z79899 Other long term (current) drug therapy: Secondary | ICD-10-CM

## 2023-07-22 DIAGNOSIS — E282 Polycystic ovarian syndrome: Secondary | ICD-10-CM | POA: Insufficient documentation

## 2023-07-22 HISTORY — PX: ACHILLES TENDON SURGERY: SHX542

## 2023-07-22 HISTORY — PX: GASTROCNEMIUS RECESSION: SHX863

## 2023-07-22 LAB — POCT PREGNANCY, URINE: Preg Test, Ur: NEGATIVE

## 2023-07-22 SURGERY — RECONSTRUCTION, TENDON, ACHILLES
Anesthesia: General | Site: Leg Lower | Laterality: Left

## 2023-07-22 MED ORDER — CEFAZOLIN SODIUM-DEXTROSE 2-4 GM/100ML-% IV SOLN
2.0000 g | INTRAVENOUS | Status: AC
  Administered 2023-07-22: 3 g via INTRAVENOUS

## 2023-07-22 MED ORDER — AMISULPRIDE (ANTIEMETIC) 5 MG/2ML IV SOLN: 10.0000 mg | Freq: Once | INTRAVENOUS | Status: DC | PRN

## 2023-07-22 MED ORDER — PHENYLEPHRINE 80 MCG/ML (10ML) SYRINGE FOR IV PUSH (FOR BLOOD PRESSURE SUPPORT)
PREFILLED_SYRINGE | INTRAVENOUS | Status: AC
  Filled 2023-07-22: qty 10

## 2023-07-22 MED ORDER — LIDOCAINE 2% (20 MG/ML) 5 ML SYRINGE
INTRAMUSCULAR | Status: AC
  Filled 2023-07-22: qty 5

## 2023-07-22 MED ORDER — RIVAROXABAN 10 MG PO TABS: 10.0000 mg | ORAL_TABLET | Freq: Every day | ORAL | 0 refills | Status: DC

## 2023-07-22 MED ORDER — SENNA 8.6 MG PO TABS: 2.0000 | ORAL_TABLET | Freq: Two times a day (BID) | ORAL | 0 refills | Status: DC

## 2023-07-22 MED ORDER — OXYCODONE HCL 5 MG PO TABS: 5.0000 mg | ORAL_TABLET | Freq: Four times a day (QID) | ORAL | 0 refills | Status: AC | PRN

## 2023-07-22 MED ORDER — BUPIVACAINE HCL (PF) 0.5 % IJ SOLN
INTRAMUSCULAR | Status: DC | PRN
Start: 2023-07-22 — End: 2023-07-22
  Administered 2023-07-22: 10 mL via PERINEURAL

## 2023-07-22 MED ORDER — MIDAZOLAM HCL 2 MG/2ML IJ SOLN
INTRAMUSCULAR | Status: AC
  Filled 2023-07-22: qty 2

## 2023-07-22 MED ORDER — DEXAMETHASONE SODIUM PHOSPHATE 10 MG/ML IJ SOLN
INTRAMUSCULAR | Status: DC | PRN
  Administered 2023-07-22: 10 mg via INTRAVENOUS

## 2023-07-22 MED ORDER — FENTANYL CITRATE (PF) 100 MCG/2ML IJ SOLN
100.0000 ug | Freq: Once | INTRAMUSCULAR | Status: AC
  Administered 2023-07-22: 50 ug via INTRAVENOUS

## 2023-07-22 MED ORDER — ACETAMINOPHEN 500 MG PO TABS
ORAL_TABLET | ORAL | Status: AC
  Filled 2023-07-22: qty 2

## 2023-07-22 MED ORDER — CEFAZOLIN SODIUM-DEXTROSE 3-4 GM/150ML-% IV SOLN
INTRAVENOUS | Status: AC
  Filled 2023-07-22: qty 150

## 2023-07-22 MED ORDER — ONDANSETRON HCL 4 MG/2ML IJ SOLN
INTRAMUSCULAR | Status: DC | PRN
  Administered 2023-07-22: 4 mg via INTRAVENOUS

## 2023-07-22 MED ORDER — BUPIVACAINE HCL (PF) 0.25 % IJ SOLN
INTRAMUSCULAR | Status: DC | PRN
  Administered 2023-07-22: 10 mL via PERINEURAL

## 2023-07-22 MED ORDER — LACTATED RINGERS IV SOLN: INTRAVENOUS | Status: DC

## 2023-07-22 MED ORDER — ACETAMINOPHEN 500 MG PO TABS
1000.0000 mg | ORAL_TABLET | Freq: Once | ORAL | Status: AC
  Administered 2023-07-22: 1000 mg via ORAL

## 2023-07-22 MED ORDER — FENTANYL CITRATE (PF) 100 MCG/2ML IJ SOLN
INTRAMUSCULAR | Status: AC
  Filled 2023-07-22: qty 2

## 2023-07-22 MED ORDER — ROCURONIUM BROMIDE 10 MG/ML (PF) SYRINGE
PREFILLED_SYRINGE | INTRAVENOUS | Status: DC | PRN
  Administered 2023-07-22: 60 mg via INTRAVENOUS

## 2023-07-22 MED ORDER — PROPOFOL 10 MG/ML IV BOLUS
INTRAVENOUS | Status: DC | PRN
  Administered 2023-07-22: 200 mg via INTRAVENOUS

## 2023-07-22 MED ORDER — PHENYLEPHRINE 80 MCG/ML (10ML) SYRINGE FOR IV PUSH (FOR BLOOD PRESSURE SUPPORT)
PREFILLED_SYRINGE | INTRAVENOUS | Status: DC | PRN
Start: 2023-07-22 — End: 2023-07-22
  Administered 2023-07-22: 160 ug via INTRAVENOUS
  Administered 2023-07-22 (×4): 80 ug via INTRAVENOUS

## 2023-07-22 MED ORDER — ROCURONIUM BROMIDE 10 MG/ML (PF) SYRINGE
PREFILLED_SYRINGE | INTRAVENOUS | Status: AC
  Filled 2023-07-22: qty 10

## 2023-07-22 MED ORDER — DOCUSATE SODIUM 100 MG PO CAPS: 100.0000 mg | ORAL_CAPSULE | Freq: Two times a day (BID) | ORAL | 0 refills | Status: DC

## 2023-07-22 MED ORDER — MIDAZOLAM HCL 2 MG/2ML IJ SOLN
2.0000 mg | Freq: Once | INTRAMUSCULAR | Status: AC
  Administered 2023-07-22: 2 mg via INTRAVENOUS

## 2023-07-22 MED ORDER — SUGAMMADEX SODIUM 200 MG/2ML IV SOLN
INTRAVENOUS | Status: DC | PRN
  Administered 2023-07-22: 400 mg via INTRAVENOUS

## 2023-07-22 MED ORDER — OXYCODONE HCL 5 MG PO TABS: 5.0000 mg | ORAL_TABLET | Freq: Once | ORAL | Status: DC | PRN

## 2023-07-22 MED ORDER — ONDANSETRON HCL 4 MG/2ML IJ SOLN
INTRAMUSCULAR | Status: AC
  Filled 2023-07-22: qty 2

## 2023-07-22 MED ORDER — FENTANYL CITRATE (PF) 100 MCG/2ML IJ SOLN
INTRAMUSCULAR | Status: DC | PRN
  Administered 2023-07-22: 50 ug via INTRAVENOUS

## 2023-07-22 MED ORDER — VANCOMYCIN HCL 500 MG IV SOLR
INTRAVENOUS | Status: DC | PRN
  Administered 2023-07-22: 500 mg via TOPICAL

## 2023-07-22 MED ORDER — FENTANYL CITRATE (PF) 100 MCG/2ML IJ SOLN
25.0000 ug | INTRAMUSCULAR | Status: DC | PRN
  Administered 2023-07-22: 50 ug via INTRAVENOUS

## 2023-07-22 MED ORDER — LIDOCAINE 2% (20 MG/ML) 5 ML SYRINGE
INTRAMUSCULAR | Status: DC | PRN
  Administered 2023-07-22: 100 mg via INTRAVENOUS

## 2023-07-22 MED ORDER — BUPIVACAINE LIPOSOME 1.3 % IJ SUSP
INTRAMUSCULAR | Status: DC | PRN
  Administered 2023-07-22: 10 mL via PERINEURAL

## 2023-07-22 MED ORDER — DEXAMETHASONE SODIUM PHOSPHATE 10 MG/ML IJ SOLN
INTRAMUSCULAR | Status: AC
  Filled 2023-07-22: qty 1

## 2023-07-22 MED ORDER — OXYCODONE HCL 5 MG/5ML PO SOLN: 5.0000 mg | Freq: Once | ORAL | Status: DC | PRN

## 2023-07-22 MED ORDER — 0.9 % SODIUM CHLORIDE (POUR BTL) OPTIME
TOPICAL | Status: DC | PRN
Start: 2023-07-22 — End: 2023-07-22
  Administered 2023-07-22: 300 mL

## 2023-07-22 SURGICAL SUPPLY — 56 items
ANCHOR JUGGERKNOT SOFT 2.9 (Anchor) ×1 IMPLANT
BLADE AVERAGE 25X9 (BLADE) IMPLANT
BLADE MICRO SAGITTAL (BLADE) IMPLANT
BLADE SURG 15 STRL LF DISP TIS (BLADE) ×6 IMPLANT
BNDG COMPR ESMARK 6X3 LF (GAUZE/BANDAGES/DRESSINGS) ×1 IMPLANT
BNDG ELASTIC 4INX 5YD STR LF (GAUZE/BANDAGES/DRESSINGS) ×3 IMPLANT
BNDG ELASTIC 6INX 5YD STR LF (GAUZE/BANDAGES/DRESSINGS) ×3 IMPLANT
BOOT STEPPER DURA LG (SOFTGOODS) IMPLANT
BOOT STEPPER DURA MED (SOFTGOODS) IMPLANT
CANISTER SUCT 1200ML W/VALVE (MISCELLANEOUS) IMPLANT
CHLORAPREP W/TINT 26 (MISCELLANEOUS) ×3 IMPLANT
COVER BACK TABLE 60X90IN (DRAPES) ×3 IMPLANT
CUFF TRNQT CYL 34X4.125X (TOURNIQUET CUFF) ×3 IMPLANT
DRAPE EXTREMITY T 121X128X90 (DISPOSABLE) ×3 IMPLANT
DRAPE OEC MINIVIEW 54X84 (DRAPES) IMPLANT
DRAPE U-SHAPE 47X51 STRL (DRAPES) ×3 IMPLANT
DRSG MEPITEL 4X7.2 (GAUZE/BANDAGES/DRESSINGS) ×3 IMPLANT
ELECTRODE REM PT RTRN 9FT ADLT (ELECTROSURGICAL) ×3 IMPLANT
GAUZE PAD ABD 8X10 STRL (GAUZE/BANDAGES/DRESSINGS) ×6 IMPLANT
GAUZE SPONGE 4X4 12PLY STRL (GAUZE/BANDAGES/DRESSINGS) ×3 IMPLANT
GLOVE BIO SURGEON STRL SZ8 (GLOVE) ×3 IMPLANT
GLOVE BIOGEL PI IND STRL 8 (GLOVE) ×6 IMPLANT
GLOVE ECLIPSE 8.0 STRL XLNG CF (GLOVE) ×3 IMPLANT
GOWN STRL REUS W/ TWL LRG LVL3 (GOWN DISPOSABLE) ×3 IMPLANT
GOWN STRL REUS W/ TWL XL LVL3 (GOWN DISPOSABLE) ×6 IMPLANT
NDL HYPO 22X1.5 SAFETY MO (MISCELLANEOUS) IMPLANT
NDL SAFETY ECLIPSE 18X1.5 (NEEDLE) IMPLANT
NDL SUT 6 .5 CRC .975X.05 MAYO (NEEDLE) IMPLANT
NEEDLE HYPO 22X1.5 SAFETY MO (MISCELLANEOUS) IMPLANT
NS IRRIG 1000ML POUR BTL (IV SOLUTION) ×3 IMPLANT
PACK BASIN DAY SURGERY FS (CUSTOM PROCEDURE TRAY) ×3 IMPLANT
PAD CAST 4YDX4 CTTN HI CHSV (CAST SUPPLIES) ×3 IMPLANT
PADDING CAST COTTON 6X4 STRL (CAST SUPPLIES) ×3 IMPLANT
PENCIL SMOKE EVACUATOR (MISCELLANEOUS) ×3 IMPLANT
SHEET MEDIUM DRAPE 40X70 STRL (DRAPES) ×3 IMPLANT
SLEEVE SCD COMPRESS KNEE MED (STOCKING) ×3 IMPLANT
SPLINT PLASTER CAST FAST 5X30 (CAST SUPPLIES) ×60 IMPLANT
SPONGE T-LAP 18X18 ~~LOC~~+RFID (SPONGE) ×3 IMPLANT
STAPLER SKIN PROX WIDE 3.9 (STAPLE) IMPLANT
STOCKINETTE 6 STRL (DRAPES) ×3 IMPLANT
SUCTION TUBE FRAZIER 10FR DISP (SUCTIONS) ×3 IMPLANT
SUT ETHIBOND 2 OS 4 DA (SUTURE) IMPLANT
SUT ETHIBOND 3-0 V-5 (SUTURE) IMPLANT
SUT ETHILON 3 0 PS 1 (SUTURE) ×3 IMPLANT
SUT MNCRL AB 3-0 PS2 18 (SUTURE) ×3 IMPLANT
SUT VIC AB 0 CT1 27XBRD ANBCTR (SUTURE) IMPLANT
SUT VIC AB 1 CT1 27XBRD ANBCTR (SUTURE) ×3 IMPLANT
SUT VIC AB 2-0 SH 18 (SUTURE) ×1 IMPLANT
SUT VIC AB 2-0 SH 27XBRD (SUTURE) IMPLANT
SUT VICRYL 0 SH 27 (SUTURE) ×3 IMPLANT
SUTURE FIBERWR #2 38 T-5 BLUE (SUTURE) IMPLANT
SYR BULB EAR ULCER 3OZ GRN STR (SYRINGE) ×3 IMPLANT
TOWEL GREEN STERILE FF (TOWEL DISPOSABLE) ×5 IMPLANT
TUBE CONNECTING 20X1/4 (TUBING) ×3 IMPLANT
UNDERPAD 30X36 HEAVY ABSORB (UNDERPADS AND DIAPERS) ×3 IMPLANT
YANKAUER SUCT BULB TIP NO VENT (SUCTIONS) ×1 IMPLANT

## 2023-07-22 NOTE — Progress Notes (Signed)
 Assisted Dr. Ardeen Jourdain with left, adductor canal, popliteal, ultrasound guided block. Side rails up, monitors on throughout procedure. See vital signs in flow sheet. Tolerated Procedure well.

## 2023-07-22 NOTE — H&P (Signed)
 [name] is an 38 y.o. female.   Chief Complaint: left ankle pain HPI: 38 y/o female without significant PMH injured her left ankle at work about two weeks ago.  She ruptured her left achilles tendon and presents today for achilles repair.  Past Medical History:  Diagnosis Date   Allergy    Anxiety    Back pain    Constipation    Depression    Dyspnea    Gallbladder problem    GERD (gastroesophageal reflux disease)    IBS (irritable bowel syndrome)    Interstitial cystitis    Lactose intolerance    Migraine    Overactive bladder    Palpitations    PCOS (polycystic ovarian syndrome)    Pelvic pain    Pseudotumor cerebri    SUI (stress urinary incontinence, female)    Urinary incontinence     Past Surgical History:  Procedure Laterality Date   CESAREAN SECTION  2009  &  2011   2011 W/ BILATERAL TUBAL LIGATION   CHOLECYSTECTOMY  2010   CYSTO WITH HYDRODISTENSION  03/01/2012   Procedure: CYSTOSCOPY/HYDRODISTENSION;  Surgeon: Glendia DELENA Elizabeth, MD;  Location: [facility];  Service: Urology;  Laterality: N/A;  INSTILLATION of marcaine  and pyridium .   PUBOVAGINAL SLING N/A 02/14/2013   Procedure: PUBO-VAGINAL SLING AND HYDRODISTENSION;  Surgeon: Glendia DELENA Elizabeth, MD;  Location: [facility];  Service: Urology;  Laterality: N/A;   TRANSTHORACIC ECHOCARDIOGRAM  06/2009   TUBAL LIGATION      Family History  Problem Relation Age of Onset   Hypertension Mother    Depression Mother    Diabetes Mother    Anxiety disorder Mother    Sleep apnea Mother    Obesity Mother    Hypertension Father    Cancer Father        kidney   Depression Father    Hyperlipidemia Father    Kidney cancer Father    Alcoholism Father    Obesity Father    Hypertension Maternal Grandmother    Cancer Maternal Grandmother        kidney, bone   Hypertension Maternal Grandfather    Cancer Maternal Grandfather        lung, bone   Stroke Maternal Grandfather     Hypertension Paternal Grandmother    Hypertension Paternal Grandfather    Stroke Paternal Grandfather    Cancer Maternal Uncle        stomach   Social History:  reports that she has never smoked. She has never used smokeless tobacco. She reports that she does not drink alcohol and does not use drugs.  Allergies: No Known Allergies  No medications prior to admission.    Results for orders placed or performed during the hospital encounter of 07/22/23 (from the past 48 hours)  Basic metabolic panel per protocol     Status: Abnormal   Collection Time: 07/21/23  3:30 PM  Result Value Ref Range   Sodium 138 135 - 145 mmol/L   Potassium 4.3 3.5 - 5.1 mmol/L   Chloride 110 98 - 111 mmol/L   CO2 18 (L) 22 - 32 mmol/L   Glucose, Bld 85 70 - 99 mg/dL    Comment: Glucose reference range applies only to samples taken after fasting for at least 8 hours.   BUN 11 6 - 20 mg/dL   Creatinine, Ser 9.17 0.44 - 1.00 mg/dL   Calcium  8.6 (L) 8.9 - 10.3 mg/dL   GFR, Estimated >  60 >60 mL/min    Comment: (NOTE) Calculated using the CKD-EPI Creatinine Equation (2021)    Anion gap 10 5 - 15    Comment: Performed at [facility], [address], [place], [place] [address]   No results found.  Review of Systems  no recent f/c/n/v/ wt loss  Height 5' 4 (1.626 m), weight 127 kg, last menstrual period 07/07/2023. Physical Exam  Wn wd woman in nad.  Aa nd O.  EOMI.  Resp unlabored.  L ankle with swelling and ecchymosis.  Abnormal thompson test.  Palpable pulses in the left foot.  Intact sens to LT in the sural n dist.  Assessment/Plan L achilles tendon rupture - to the OR today for left achilles tendon repair.  The risks and benefits of the alternative treatment options have been discussed in detail.  The patient wishes to proceed with surgery and specifically understands risks of bleeding, infection, nerve damage, blood clots, need for additional surgery, amputation and death.   Norleen Armor,  MD 08/01/2023, 9:00 AM

## 2023-07-22 NOTE — Anesthesia Procedure Notes (Signed)
 Anesthesia Regional Block: Adductor canal block   Pre-Anesthetic Checklist: , timeout performed,  Correct Patient, Correct Site, Correct Laterality,  Correct Procedure, Correct Position, site marked,  Risks and benefits discussed,  Surgical consent,  Pre-op evaluation,  At surgeon's request and post-op pain management  Laterality: Left  Prep: chloraprep       Needles:  Injection technique: Single-shot  Needle Type: Echogenic Stimulator Needle     Needle Length: 9cm  Needle Gauge: 21     Additional Needles:   Procedures:,,,, ultrasound used (permanent image in chart),,    Narrative:  Start time: 07/22/2023 9:59 AM End time: 07/22/2023 10:01 AM Injection made incrementally with aspirations every 5 mL.  Performed by: Personally  Anesthesiologist: Peggye Delon Brunswick, MD  Additional Notes: Discussed risks and benefits of nerve block including, but not limited to, prolonged and/or permanent nerve injury involving sensory and/or motor function. Monitors were applied and a time-out was performed. The nerve and associated structures were visualized under ultrasound guidance. After negative aspiration, local anesthetic was slowly injected around the nerve. There was no evidence of high pressure during the procedure. There were no paresthesias. VSS remained stable and the patient tolerated the procedure well.

## 2023-07-22 NOTE — Op Note (Signed)
 07/22/2023  12:31 PM  PATIENT:  [name]  38 y.o. female  PRE-OPERATIVE DIAGNOSIS:  Rupture of left Achilles tendon, initial encounter  POST-OPERATIVE DIAGNOSIS:  1.  Avulsion of the left achilles tendon from the calcaneus     2.  Left short achilles tendon     3.  Left calcaneus Haglund deformity  Procedure(s): Left gastrocnemius recession 2.  Excision of left calcaneus Haglund deformity 3.  Left achilles tendon reconstruction with plantaris autograft  SURGEON:  Norleen Armor, MD  ASSISTANT: Eva Barrack, PA-C  ANESTHESIA:   General, regional  EBL:  minimal   TOURNIQUET:   Total Tourniquet Time Documented: Thigh (Left) - 43 minutes Total: Thigh (Left) - 43 minutes  COMPLICATIONS:  None apparent  DISPOSITION:  Extubated, awake and stable to recovery.  INDICATION FOR PROCEDURE: 38 year old female with a past medical history significant for obesity, work injuring her left Achilles.  Physical exam findings are consistent with Achilles tendon rupture.  She presents now for operative treatment of this acute injury.  The risks and benefits of the alternative treatment options have been discussed in detail.  The patient wishes to proceed with surgery and specifically understands risks of bleeding, infection, nerve damage, blood clots, need for additional surgery, amputation and death.   PROCEDURE IN DETAIL: After preoperative consent was obtained and the correct operative site was identified, patient was brought to the operating room supine on the stretcher.  General anesthesia was induced.  Preoperative antibiotics were administered.  Surgical timeout was taken.  The left lower extremity was exsanguinated and a thigh tourniquet inflated to 250 mmHg.  Patient was then turned in the prone position on the operating room table all.  Prepped and draped in standard sterile fashion.  Longitudinal incision was made defect.  Dissection was carried sharply down through the subcu tissues.   The tendon rupture was in the.  A traction suture was placed in the proximal stump.  Calcification was identified with the stump.  With traction on the stump of the tendon it became apparent that the tendon had avulsed from the calcaneus.  The incision was extended distally exposing the insertion.  There were few fibers of tendon left intact medially and laterally off the insertion point.  The tendon was avulsed centrally over at least three fourths of its cross-sectional area.  The patient was also noted to have a prominent Haglund deformity.  The Haglund deformity was resected with a rondure.  There were insufficient fibers medially and laterally to allow primary repair.  The decision was made to insert anchor.  A double loaded juggernaut anchor [employer] was inserted into the calcaneus at the insertion point of the Achilles.  The tendon was debrided of calcifications.  The tendon was noted to be contracted and could not be advanced all the way to bone.  The decision was made to proceed with a gastrocnemius recession to improve excursion of the tendon.  Attention was turned to the calf.  A longitudinal incision was made.  Dissection was carried down to the subcutaneous tissues taking care to protect the sural nerve and lesser saphenous vein.  The gastrocnemius tendon was identified.  It was divided in its entirety under direct vision.  This allowed sufficient excursion of the distal tendon stump to the insertion point.  The proximal incision was irrigated, sprinkled with vancomycin powder and closed with Monocryl and nylon.  Attention was returned to the distal wound.  The suture was passed medially and laterally into the  tendon using a Bennell pattern.  The ankle was plantarflexed.  The tendon was advanced down to bone and the suture tied medially and laterally.  The plantaris tendon was then from the insertion point at the medial calcaneus through the Achilles stump using 4 passes.  It was then sewn into  the distal fibers with figure-of-eight sutures of #1 Vicryl.  The medial and lateral tendon stumps distally were repaired to the proximal stump using figure-of-eight sutures of #1 Vicryl.  The wound was irrigated copiously and sprinkled with vancomycin powder.  The peritenon was repaired with inverted simple sutures of 2-0 Vicryl.  The skin incision was closed with 3-0 nylon.  Sterile dressings were applied followed by a well-padded short leg splint with wrap clamps.  The tourniquet was released after application of dressings.  The patient was awakened from anesthesia and transported to the recovery room in stable condition.  FOLLOW UP PLAN: Nonweightbearing on the left lower extremity.  Xarelto for prophylaxis.  Follow-up in 2 weeks for suture removal and conversion to a short leg cast.  Plan 6 weeks postoperative.     Justin Ollis PA-C was present and scrubbed for the duration of the operative case. His assistance was essential in positioning the patient, prepping and draping, gaining and maintaining exposure, performing the operation, closing and dressing the wounds and applying the splint.

## 2023-07-22 NOTE — Anesthesia Procedure Notes (Signed)
 Anesthesia Regional Block: Popliteal block   Pre-Anesthetic Checklist: , timeout performed,  Correct Patient, Correct Site, Correct Laterality,  Correct Procedure, Correct Position, site marked,  Risks and benefits discussed,  Surgical consent,  Pre-op evaluation,  At surgeon's request and post-op pain management  Laterality: Left  Prep: chloraprep       Needles:  Injection technique: Single-shot  Needle Type: Echogenic Stimulator Needle     Needle Length: 9cm  Needle Gauge: 21     Additional Needles:   Procedures:,,,, ultrasound used (permanent image in chart),,    Narrative:  Start time: 07/22/2023 9:54 AM End time: 07/22/2023 9:59 AM Injection made incrementally with aspirations every 5 mL.  Performed by: Personally  Anesthesiologist: Peggye Delon Brunswick, MD  Additional Notes: Discussed risks and benefits of nerve block including, but not limited to, prolonged and/or permanent nerve injury involving sensory and/or motor function. Monitors were applied and a time-out was performed. The nerve and associated structures were visualized under ultrasound guidance. After negative aspiration, local anesthetic was slowly injected around the nerve. There was no evidence of high pressure during the procedure. There were no paresthesias. VSS remained stable and the patient tolerated the procedure well.

## 2023-07-22 NOTE — Anesthesia Procedure Notes (Signed)
 Procedure Name: Intubation Date/Time: 07/22/2023 11:43 AM  Performed by: Delayne Olam BIRCH, CRNAPre-anesthesia Checklist: Patient identified, Emergency Drugs available, Suction available and Patient being monitored Patient Re-evaluated:Patient Re-evaluated prior to induction Oxygen Delivery Method: Circle system utilized Preoxygenation: Pre-oxygenation with 100% oxygen Induction Type: IV induction Ventilation: Mask ventilation without difficulty Laryngoscope Size: Mac and 4 Grade View: Grade I Tube type: Oral Tube size: 7.0 mm Number of attempts: 1 Airway Equipment and Method: Stylet and Oral airway Placement Confirmation: ETT inserted through vocal cords under direct vision, positive ETCO2 and breath sounds checked- equal and bilateral Secured at: 22 cm Tube secured with: Tape Dental Injury: Teeth and Oropharynx as per pre-operative assessment

## 2023-07-22 NOTE — Discharge Instructions (Addendum)
 Norleen Armor, MD [facility]  Please read the following information regarding your care after surgery.  Medications  You only need a prescription for the narcotic pain medicine (ex. oxycodone , Percocet, Norco).  All of the other medicines listed below are available over the counter. ? Aleve  2 pills twice a day for the first 3 days after surgery. ? acetominophen (Tylenol ) 650 mg every 4-6 hours as you need for minor to moderate pain ? oxycodone  as prescribed for severe pain  Narcotic pain medicine (ex. oxycodone , Percocet, Vicodin) will cause constipation.  To prevent this problem, take the following medicines while you are taking any pain medicine. ? docusate sodium (Colace) 100 mg twice a day ? senna (Senokot) 2 tablets twice a day  ? To help prevent blood clots, take Xarelto as prescribed for two weeks after surgery.  You should also get up every hour while you are awake to move around.    Weight Bearing ? Do not bear any weight on the operated leg or foot.  Cast / Splint / Dressing ? Keep your splint, cast or dressing clean and dry.  Don't put anything (coat hanger, pencil, etc) down inside of it.  If it gets damp, use a hair dryer on the cool setting to dry it.  If it gets soaked, call the office to schedule an appointment for a cast change.    After your dressing, cast or splint is removed; you may shower, but do not soak or scrub the wound.  Allow the water  to run over it, and then gently pat it dry.  Swelling It is normal for you to have swelling where you had surgery.  To reduce swelling and pain, keep your toes above your nose for at least 3 days after surgery.  It may be necessary to keep your foot or leg elevated for several weeks.  If it hurts, it should be elevated.  Follow Up Call my office at [phone] when you are discharged from the hospital or surgery center to schedule an appointment to be seen two weeks after surgery.  Call my office at [phone] if you develop  a fever >101.5 F, nausea, vomiting, bleeding from the surgical site or severe pain.     Post Anesthesia Home Care Instructions  Activity: Get plenty of rest for the remainder of the day. A responsible individual must stay with you for 24 hours following the procedure.  For the next 24 hours, DO NOT: -Drive a car -Advertising copywriter -Drink alcoholic beverages -Take any medication unless instructed by your physician -Make any legal decisions or sign important papers.  Meals: Start with liquid foods such as gelatin or soup. Progress to regular foods as tolerated. Avoid greasy, spicy, heavy foods. If nausea and/or vomiting occur, drink only clear liquids until the nausea and/or vomiting subsides. Call your physician if vomiting continues.  Special Instructions/Symptoms: Your throat may feel dry or sore from the anesthesia or the breathing tube placed in your throat during surgery. If this causes discomfort, gargle with warm salt water . The discomfort should disappear within 24 hours.  May have Tylenol  after 3:45pm if needed.     Information for Discharge Teaching: EXPAREL  (bupivacaine  liposome injectable suspension)   Pain relief is important to your recovery. The goal is to control your pain so you can move easier and return to your normal activities as soon as possible after your procedure. Your physician may use several types of medicines to manage pain, swelling, and more.  Your Careers adviser or  anesthesiologist gave you EXPAREL (bupivacaine ) to help control your pain after surgery.  EXPAREL  is a local anesthetic designed to release slowly over an extended period of time to provide pain relief by numbing the tissue around the surgical site. EXPAREL  is designed to release pain medication over time and can control pain for up to 72 hours. Depending on how you respond to EXPAREL , you may require less pain medication during your recovery. EXPAREL  can help reduce or eliminate the need for  opioids during the first few days after surgery when pain relief is needed the most. EXPAREL  is not an opioid and is not addictive. It does not cause sleepiness or sedation.   Important! A teal colored band has been placed on your arm with the date, time and amount of EXPAREL  you have received. Please leave this armband in place for the full 96 hours following administration, and then you may remove the band. If you return to the hospital for any reason within 96 hours following the administration of EXPAREL , the armband provides important information that your health care providers to know, and alerts them that you have received this anesthetic.    Possible side effects of EXPAREL : Temporary loss of sensation or ability to move in the area where medication was injected. Nausea, vomiting, constipation Rarely, numbness and tingling in your mouth or lips, lightheadedness, or anxiety may occur. Call your doctor right away if you think you may be experiencing any of these sensations, or if you have other questions regarding possible side effects.  Follow all other discharge instructions given to you by your surgeon or nurse. Eat a healthy diet and drink plenty of water  or other fluids.

## 2023-07-22 NOTE — Anesthesia Postprocedure Evaluation (Signed)
 Anesthesia Post Note  Patient: [name]  Procedure(s) Performed: Left achilles tendon reconstruction with plantaris autograft and excision of Left Calcaneous Haglund Deformity (Left: Heel) Gastroc Recession (Left: Leg Lower)     Patient location during evaluation: PACU Anesthesia Type: General and Regional Level of consciousness: awake Pain management: pain level controlled Vital Signs Assessment: post-procedure vital signs reviewed and stable Respiratory status: spontaneous breathing, nonlabored ventilation and respiratory function stable Cardiovascular status: blood pressure returned to baseline and stable Postop Assessment: no apparent nausea or vomiting Anesthetic complications: no   No notable events documented.  Last Vitals:  Vitals:   07/22/23 1300 07/22/23 1315  BP: 117/72 101/65  Pulse: (!) 109 (!) 102  Resp: (!) 26 13  Temp:    SpO2: 97% 94%    Last Pain:  Vitals:   07/22/23 1315  TempSrc:   PainSc: 3                  [name]

## 2023-07-22 NOTE — Anesthesia Preprocedure Evaluation (Addendum)
 Anesthesia Evaluation  Patient identified by MRN, date of birth, ID band Patient awake    Reviewed: Allergy & Precautions, NPO status , Patient's Chart, lab work & pertinent test results  History of Anesthesia Complications Negative for: history of anesthetic complications  Airway Mallampati: II  TM Distance: >3 FB Neck ROM: Full   Comment: Previous grade I view with MAC 3, easy mask Dental  (+) Dental Advisory Given   Pulmonary neg shortness of breath, neg COPD, Recent URI  (finished medication 6/18), Residual Cough   Pulmonary exam normal breath sounds clear to auscultation       Cardiovascular negative cardio ROS  Rhythm:Regular Rate:Normal     Neuro/Psych  Headaches, neg Seizures PSYCHIATRIC DISORDERS Anxiety Depression    Pseudotumor cerebri    GI/Hepatic Neg liver ROS,GERD  ,,  Endo/Other    Class 3 obesityPre-diabetes  Renal/GU negative Renal ROS Bladder dysfunction Female GU complaint     Musculoskeletal   Abdominal  (+) + obese  Peds  Hematology negative hematology ROS (+)   Anesthesia Other Findings   Reproductive/Obstetrics PCOS                              Anesthesia Physical Anesthesia Plan  ASA: 3  Anesthesia Plan: General   Post-op Pain Management: Regional block* and Tylenol  PO (pre-op)*   Induction: Intravenous  PONV Risk Score and Plan: 3 and Ondansetron , Dexamethasone  and Treatment may vary due to age or medical condition  Airway Management Planned: LMA  Additional Equipment:   Intra-op Plan:   Post-operative Plan: Extubation in OR  Informed Consent: I have reviewed the patients History and Physical, chart, labs and discussed the procedure including the risks, benefits and alternatives for the proposed anesthesia with the patient or authorized representative who has indicated his/her understanding and acceptance.     Dental advisory given  Plan  Discussed with: CRNA and Anesthesiologist  Anesthesia Plan Comments: (Risks of general anesthesia discussed including, but not limited to, sore throat, hoarse voice, chipped/damaged teeth, injury to vocal cords, nausea and vomiting, allergic reactions, lung infection, heart attack, stroke, and death. All questions answered.  Discussed potential risks of nerve blocks including, but not limited to, infection, bleeding, nerve damage, seizures, pneumothorax, respiratory depression, and potential failure of the block. Alternatives to nerve blocks discussed. All questions answered. )         Anesthesia Quick Evaluation

## 2023-07-22 NOTE — Transfer of Care (Signed)
 Immediate Anesthesia Transfer of Care Note  Patient: [name]  Procedure(s) Performed: Left achilles tendon reconstruction with plantaris autograft and excision of Left Calcaneous Haglund Deformity (Left: Heel) Gastroc Recession (Left: Leg Lower)  Patient Location: PACU  Anesthesia Type:General  Level of Consciousness: awake  Airway & Oxygen Therapy: Patient Spontanous Breathing  Post-op Assessment: Report given to RN and Post -op Vital signs reviewed and stable  Post vital signs: Reviewed and stable  Last Vitals:  Vitals Value Taken Time  BP 126/68 07/22/23 12:45  Temp    Pulse 112 07/22/23 12:55  Resp 17 07/22/23 12:55  SpO2 94 % 07/22/23 12:55  Vitals shown include unfiled device data.  Last Pain:  Vitals:   07/22/23 0939  TempSrc: Temporal  PainSc: 0-No pain      Patients Stated Pain Goal: 3 (07/22/23 0939)  Complications: No notable events documented.

## 2023-07-23 ENCOUNTER — Encounter (HOSPITAL_BASED_OUTPATIENT_CLINIC_OR_DEPARTMENT_OTHER): Payer: Self-pay | Admitting: Orthopedic Surgery

## 2023-08-02 ENCOUNTER — Telehealth (HOSPITAL_BASED_OUTPATIENT_CLINIC_OR_DEPARTMENT_OTHER): Admitting: Psychiatry

## 2023-08-02 ENCOUNTER — Encounter (HOSPITAL_COMMUNITY): Payer: Self-pay | Admitting: Psychiatry

## 2023-08-02 DIAGNOSIS — F32A Depression, unspecified: Secondary | ICD-10-CM | POA: Diagnosis not present

## 2023-08-02 DIAGNOSIS — G4709 Other insomnia: Secondary | ICD-10-CM | POA: Diagnosis not present

## 2023-08-02 DIAGNOSIS — F419 Anxiety disorder, unspecified: Secondary | ICD-10-CM | POA: Diagnosis not present

## 2023-08-02 MED ORDER — BUPROPION HCL ER (XL) 300 MG PO TB24: 300.0000 mg | ORAL_TABLET | Freq: Every day | ORAL | 2 refills | Status: DC

## 2023-08-02 MED ORDER — ZOLPIDEM TARTRATE 5 MG PO TABS: 5.0000 mg | ORAL_TABLET | Freq: Every day | ORAL | 2 refills | Status: DC

## 2023-08-02 NOTE — Progress Notes (Addendum)
 [facility] MD Virtual Progress Note   Patient Location: Home Provider Location: Home Office  I connect with patient by video and verified that I am speaking with correct person by using two identifiers. I discussed the limitations of evaluation and management by telemedicine and the availability of in person appointments. I also discussed with the patient that there may be a patient responsible charge related to this service. The patient expressed understanding and agreed to proceed.  [name] [id number] 38 y.o.  08/02/2023 3:52 PM  History of Present Illness:  Patient is evaluated by video session.  She had a fall at work and rupture her Achilles tendon. 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 She required surgery and waiting to get better. She is frustrated because she is sitting at home and not able to go back to work.  However her workmen comp insurance helping her.  She was not driving until today when she dropped her son to school.  She is hoping to get better soon.  She has appointment coming up to see her orthopedic surgeon and hoping the cast may remove but not sure when she go back to work.  She is sleeping most of the night okay.  She is not taking any narcotic pain medication.  She reported otherwise mood is stable.  She denies any mania, psychosis, hallucination.  She takes melatonin and Ambien  alternator to help her sleep.  She has no tremors shakes or any EPS.  She will continue Wellbutrin  XL 300 mg daily which is helping her anxiety and depression.  Past Psychiatric History: H/O anxiety and depression. Tried Wellbutrin  for weight loss, amitriptyline  for migraine, hydroxyzine , trazodone and Belsomra  for insomnia.  Took Effexor  for long time until it stopped working.      Outpatient Encounter Medications as of 08/02/2023  Medication Sig   buPROPion  (WELLBUTRIN  XL) 300 MG 24 hr tablet Take 1 tablet (300 mg total) by mouth daily.   cetirizine (ZYRTEC)  10 MG tablet Take 10 mg by mouth daily.   Cholecalciferol (VITAMIN D ) 125 MCG (5000 UT) CAPS Take 1 capsule by mouth daily.   docusate sodium  (COLACE) 100 MG capsule Take 1 capsule (100 mg total) by mouth 2 (two) times daily. While taking narcotic pain medicine.   fluticasone  (FLONASE ) 50 MCG/ACT nasal spray Place 2 sprays into both nostrils daily.   Melatonin 10 MG TABS Take 10 mg by mouth at bedtime.    Multiple Vitamin (MULTIVITAMIN) tablet Take 1 tablet by mouth daily.   rivaroxaban  (XARELTO ) 10 MG TABS tablet Take 1 tablet (10 mg total) by mouth daily.   senna (SENOKOT) 8.6 MG TABS tablet Take 2 tablets (17.2 mg total) by mouth 2 (two) times daily.   spironolactone (ALDACTONE) 100 MG tablet Take 100 mg by mouth daily.   SPRINTEC 28 0.25-35 MG-MCG tablet Take 1 tablet by mouth daily.   topiramate  (TOPAMAX ) 50 MG tablet Take 1 tablet (50 mg total) by mouth at bedtime.   zolpidem  (AMBIEN ) 5 MG tablet Take 1 tablet (5 mg total) by mouth at bedtime.   No facility-administered encounter medications on file as of 08/02/2023.    Recent Results (from the past 2160 hours)  Basic metabolic panel per protocol     Status: Abnormal   Collection Time: 07/21/23  3:30 PM  Result Value Ref Range   Sodium 138 135 - 145 mmol/L   Potassium 4.3 3.5 - 5.1 mmol/L   Chloride 110 98 - 111 mmol/L  CO2 18 (L) 22 - 32 mmol/L   Glucose, Bld 85 70 - 99 mg/dL    Comment: Glucose reference range applies only to samples taken after fasting for at least 8 hours.   BUN 11 6 - 20 mg/dL   Creatinine, Ser 9.17 0.44 - 1.00 mg/dL   Calcium  8.6 (L) 8.9 - 10.3 mg/dL   GFR, Estimated >39 >39 mL/min    Comment: (NOTE) Calculated using the CKD-EPI Creatinine Equation (2021)    Anion gap 10 5 - 15    Comment: Performed at [facility], [address], [place], [place] [address]  Pregnancy, urine POC     Status: None   Collection Time: 07/22/23  9:25 AM  Result Value Ref Range   Preg Test, Ur NEGATIVE NEGATIVE     Comment:        THE SENSITIVITY OF THIS METHODOLOGY IS >20 mIU/mL.      Psychiatric Specialty Exam: Physical Exam  Review of Systems  Musculoskeletal:        Left foot pain. Have cast    Weight 286 lb (129.7 kg), last menstrual period 07/07/2023.There is no height or weight on file to calculate BMI.  General Appearance: Casual  Eye Contact:  Good  Speech:  Clear and Coherent  Volume:  Normal  Mood:  Dysphoric  Affect:  Appropriate  Thought Process:  Goal Directed  Orientation:  Full (Time, Place, and Person)  Thought Content:  Rumination  Suicidal Thoughts:  No  Homicidal Thoughts:  No  Memory:  Immediate;   Good Recent;   Good Remote;   Good  Judgement:  Good  Insight:  Present  Psychomotor Activity:  Normal  Concentration:  Concentration: Good and Attention Span: Good  Recall:  Good  Fund of Knowledge:  Good  Language:  Good  Akathisia:  No  Handed:  Right  AIMS (if indicated):     Assets:  Communication Skills Desire for Improvement Housing Social Support Talents/Skills  ADL's:  Intact  Cognition:  WNL  Sleep:  sleep ok with Ambien         05/07/2023   11:55 AM 11/12/2022    2:03 PM 11/03/2021   12:39 PM 10/09/2019    2:13 PM 11/01/2018    9:54 AM  Depression screen PHQ 2/9  Decreased Interest 0 3 0 0 2  Down, Depressed, Hopeless 0 0 0 0 2  PHQ - 2 Score 0 3 0 0 4  Altered sleeping 0 3  0 1  Tired, decreased energy 1 2  0 3  Change in appetite 0 0  0 2  Feeling bad or failure about yourself  0 0  0 1  Trouble concentrating 0 1  0 2  Moving slowly or fidgety/restless 0 0  0 0  Suicidal thoughts 0 0  0 0  PHQ-9 Score 1 9  0 13  Difficult doing work/chores Not difficult at all Somewhat difficult  Not difficult at all Not difficult at all    Assessment/Plan: Anxiety and depression - Plan: buPROPion  (WELLBUTRIN  XL) 300 MG 24 hr tablet  Other insomnia - Plan: zolpidem  (AMBIEN ) 5 MG tablet  Reviewed charts from other provider and blood work results.   She is wearing cast after the surgery for Achilles tendon rupture.  She is hoping to get better soon.  Currently she is not working and works [employer] helping her.  She does not want to change the medication.  She has support from her husband.  Continue Wellbutrin  XL 300 mg daily and Ambien  5 mg as needed for insomnia.  Recommend to call us  back if she is any question or any concern.  Follow-up in 3 months.   Follow Up Instructions:     I discussed the assessment and treatment plan with the patient. The patient was provided an opportunity to ask questions and all were answered. The patient agreed with the plan and demonstrated an understanding of the instructions.   The patient was advised to call back or seek an in-person evaluation if the symptoms worsen or if the condition fails to improve as anticipated.    Collaboration of Care: Other provider involved in patient's care AEB notes are available in epic to review  Patient/Guardian was advised Release of Information must be obtained prior to any record release in order to collaborate their care with an outside provider. Patient/Guardian was advised if they have not already done so to contact the registration department to sign all necessary forms in order for us  to release information regarding their care.   Consent: Patient/Guardian gives verbal consent for treatment and assignment of benefits for services provided during this visit. Patient/Guardian expressed understanding and agreed to proceed.     Total encounter time 18 minutes which includes face-to-face time, chart reviewed, care coordination, order entry and documentation during this encounter.   Note: This document was prepared by [employer] voice dictation technology and any errors that results from this process are unintentional.    Leni ONEIDA Client, MD 08/02/2023

## 2023-08-09 ENCOUNTER — Other Ambulatory Visit (HOSPITAL_COMMUNITY): Payer: Self-pay | Admitting: Psychiatry

## 2023-08-09 DIAGNOSIS — F32A Depression, unspecified: Secondary | ICD-10-CM

## 2023-09-08 ENCOUNTER — Other Ambulatory Visit: Payer: Self-pay | Admitting: Physician Assistant

## 2023-09-08 ENCOUNTER — Encounter: Payer: Self-pay | Admitting: Physician Assistant

## 2023-09-08 ENCOUNTER — Ambulatory Visit: Payer: Self-pay | Admitting: Physician Assistant

## 2023-09-08 VITALS — BP 112/76 | HR 105 | Temp 97.5°F | Ht 64.0 in | Wt 280.0 lb

## 2023-09-08 DIAGNOSIS — S86012S Strain of left Achilles tendon, sequela: Secondary | ICD-10-CM

## 2023-09-08 DIAGNOSIS — K5909 Other constipation: Secondary | ICD-10-CM | POA: Insufficient documentation

## 2023-09-08 DIAGNOSIS — L609 Nail disorder, unspecified: Secondary | ICD-10-CM

## 2023-09-08 DIAGNOSIS — Z0000 Encounter for general adult medical examination without abnormal findings: Secondary | ICD-10-CM | POA: Diagnosis not present

## 2023-09-08 DIAGNOSIS — Z23 Encounter for immunization: Secondary | ICD-10-CM

## 2023-09-08 DIAGNOSIS — Z6841 Body Mass Index (BMI) 40.0 and over, adult: Secondary | ICD-10-CM | POA: Insufficient documentation

## 2023-09-08 DIAGNOSIS — R632 Polyphagia: Secondary | ICD-10-CM | POA: Insufficient documentation

## 2023-09-08 DIAGNOSIS — F32A Depression, unspecified: Secondary | ICD-10-CM

## 2023-09-08 DIAGNOSIS — Z1231 Encounter for screening mammogram for malignant neoplasm of breast: Secondary | ICD-10-CM

## 2023-09-08 DIAGNOSIS — Z1159 Encounter for screening for other viral diseases: Secondary | ICD-10-CM

## 2023-09-08 DIAGNOSIS — K589 Irritable bowel syndrome without diarrhea: Secondary | ICD-10-CM | POA: Insufficient documentation

## 2023-09-08 DIAGNOSIS — G43109 Migraine with aura, not intractable, without status migrainosus: Secondary | ICD-10-CM

## 2023-09-08 DIAGNOSIS — G43009 Migraine without aura, not intractable, without status migrainosus: Secondary | ICD-10-CM | POA: Insufficient documentation

## 2023-09-08 DIAGNOSIS — N3010 Interstitial cystitis (chronic) without hematuria: Secondary | ICD-10-CM | POA: Insufficient documentation

## 2023-09-08 DIAGNOSIS — F419 Anxiety disorder, unspecified: Secondary | ICD-10-CM

## 2023-09-08 DIAGNOSIS — E282 Polycystic ovarian syndrome: Secondary | ICD-10-CM | POA: Insufficient documentation

## 2023-09-08 LAB — LIPID PANEL
Cholesterol: 131 mg/dL (ref 0–200)
HDL: 50.8 mg/dL (ref >39.00)
LDL Cholesterol: 55 mg/dL (ref 0–99)
NonHDL: 79.83
Total CHOL/HDL Ratio: 3
Triglycerides: 125 mg/dL (ref 0.0–149.0)
VLDL: 25 mg/dL (ref 0.0–40.0)

## 2023-09-08 LAB — COMPREHENSIVE METABOLIC PANEL WITH GFR
ALT: 26 U/L (ref 0–35)
AST: 18 U/L (ref 0–37)
Albumin: 4.1 g/dL (ref 3.5–5.2)
Alkaline Phosphatase: 55 U/L (ref 39–117)
BUN: 17 mg/dL (ref 6–23)
CO2: 21 meq/L (ref 19–32)
Calcium: 9.1 mg/dL (ref 8.4–10.5)
Chloride: 106 meq/L (ref 96–112)
Creatinine, Ser: 0.84 mg/dL (ref 0.40–1.20)
GFR: 87.03 mL/min (ref >60.00)
Glucose, Bld: 102 mg/dL — ABNORMAL HIGH (ref 70–99)
Potassium: 3.6 meq/L (ref 3.5–5.1)
Sodium: 137 meq/L (ref 135–145)
Total Bilirubin: 0.3 mg/dL (ref 0.2–1.2)
Total Protein: 7.2 g/dL (ref 6.0–8.3)

## 2023-09-08 LAB — CBC WITH DIFFERENTIAL/PLATELET
Basophils Absolute: 0.1 K/uL (ref 0.0–0.1)
Basophils Relative: 0.7 % (ref 0.0–3.0)
Eosinophils Absolute: 0.1 K/uL (ref 0.0–0.7)
Eosinophils Relative: 1.4 % (ref 0.0–5.0)
HCT: 42.4 % (ref 36.0–46.0)
Hemoglobin: 14.2 g/dL (ref 12.0–15.0)
Lymphocytes Relative: 34.2 % (ref 12.0–46.0)
Lymphs Abs: 2.6 K/uL (ref 0.7–4.0)
MCHC: 33.4 g/dL (ref 30.0–36.0)
MCV: 96.3 fl (ref 78.0–100.0)
Monocytes Absolute: 0.4 K/uL (ref 0.1–1.0)
Monocytes Relative: 5.3 % (ref 3.0–12.0)
Neutro Abs: 4.5 K/uL (ref 1.4–7.7)
Neutrophils Relative %: 58.4 % (ref 43.0–77.0)
Platelets: 392 K/uL (ref 150.0–400.0)
RBC: 4.4 Mil/uL (ref 3.87–5.11)
RDW: 14 % (ref 11.5–15.5)
WBC: 7.7 K/uL (ref 4.0–10.5)

## 2023-09-08 LAB — TSH: TSH: 3.79 u[IU]/mL (ref 0.35–5.50)

## 2023-09-08 LAB — VITAMIN D 25 HYDROXY (VIT D DEFICIENCY, FRACTURES): VITD: 65.55 ng/mL (ref 30.00–100.00)

## 2023-09-08 LAB — HEMOGLOBIN A1C: Hgb A1c MFr Bld: 5.9 % (ref 4.6–6.5)

## 2023-09-08 LAB — VITAMIN B12: Vitamin B-12: 279 pg/mL (ref 211–911)

## 2023-09-08 MED ORDER — HYDROXYZINE HCL 25 MG PO TABS
25.0000 mg | ORAL_TABLET | Freq: Every evening | ORAL | 0 refills | Status: AC | PRN
Start: 2023-09-08 — End: ?

## 2023-09-08 NOTE — Progress Notes (Signed)
 Patient ID: [name], female    DOB: [birth year], 38 y.o.   MRN: [id number]   Assessment & Plan:  Annual physical exam -     CBC with Differential/Platelet -     Comprehensive metabolic panel with GFR -     Hemoglobin A1c -     Lipid panel -     TSH  Need for hepatitis C screening test -     Hepatitis C antibody  Need for pneumococcal vaccine -     Pneumococcal conjugate vaccine 20-valent  Need for HPV vaccine -     HPV 9-valent vaccine,Recombinat  Nail abnormality -     Vitamin B12 -     VITAMIN D  25 Hydroxy (Vit-D Deficiency, Fractures) -     Ambulatory referral to Dermatology  Anxiety and depression  Rupture of left Achilles tendon, sequela  Migraine with aura and without status migrainosus, not intractable  Other orders -     hydrOXYzine  HCl; Take 1 tablet (25 mg total) by mouth at bedtime as needed for anxiety (insomnia). Take one 25 mg tablet 30-60 minutes prior to bedtime for insomnia, anxiety. May increase to two tablets.  Dispense: 30 tablet; Refill: 0      Assessment and Plan Assessment & Plan Achilles tendon rupture, status post surgical repair Status post surgical repair of a completely torn Achilles tendon. Recovery is ongoing with a total expected duration of one year. Currently limited to one hour of weight-bearing activity per day. Experiencing some pain and discomfort, especially after exceeding activity limits. Avoiding meloxicam due to gastrointestinal discomfort. - Limit weight-bearing activity to one hour per day - Follow up with orthopedic surgeon on September 3rd for potential release to light duty work - Continue using Tylenol  for pain management  Traumatic nail loss and melanonychia, right thumb Traumatic nail loss on the right thumb with subsequent melanonychia. Differential diagnosis includes traumatic etiology versus melanocytic lesion. Dermatology consultation is pending to evaluate the need for further intervention. She is  concerned about potential malignancy but reassured by the likelihood of a benign traumatic cause. - Send urgent message to dermatology with photo of the nail for evaluation - Await dermatology's recommendation for potential biopsy or observation  Generalized anxiety, stress-related exacerbation Experiencing exacerbation of anxiety symptoms related to stress. Previously prescribed Vistaril  for anxiety management, which has been effective without causing sedation. Declined use of Klonopin due to concerns about dependency and withdrawal. Stress-related gastrointestinal symptoms noted. - Continue Vistaril  as needed for anxiety management  Migraine, stable on Topamax  therapy Migraine condition is well-managed with current Topamax  therapy.    Age-appropriate screening and counseling performed today. Will check labs and call with results. Preventive measures discussed and printed in AVS for patient.   Patient Counseling: [x]   Nutrition: Stressed importance of moderation in sodium/caffeine intake, saturated fat and cholesterol, caloric balance, sufficient intake of fresh fruits, vegetables, and fiber.  [x]   Stressed the importance of regular exercise.   [x]   Substance Abuse: Discussed cessation/primary prevention of tobacco, alcohol, or other drug use; driving or other dangerous activities under the influence; availability of treatment for abuse.   []   Injury prevention: Discussed safety belts, safety helmets, smoke detector, smoking near bedding or upholstery.   []   Sexuality: Discussed sexually transmitted diseases, partner selection, use of condoms, avoidance of unintended pregnancy  and contraceptive alternatives.   [x]   Dental health: Discussed importance of regular tooth brushing, flossing, and dental visits.  [x]   Health maintenance and immunizations reviewed.  Please refer to Health maintenance section.       Return in about 1 year (around 09/07/2024) for physical.    Subjective:     Chief Complaint  Patient presents with   Annual Exam    Pt in office for annual CPE and fasting labs;     HPI Discussed the use of AI scribe software for clinical note transcription with the patient, who gave verbal consent to proceed.  History of Present Illness [name] is a 38 year old female who presents for well visit.   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 She describes a sudden 'pop' followed by an inability to walk, requiring assistance to stand. Initially, she thought she could 'walk it off,' but the severity was confirmed at urgent care and later at the emergency department, where surgery was recommended. The surgery revealed a complete tear off the bone, necessitating a graft from her calf for repair and anchoring. She is currently in recovery and wearing a boot.  She has a history of using antibiotics such as Cipro  and Levaquin, but not recently. She also had a bone spur, which may have contributed to the injury.  She is experiencing stress-related gastrointestinal symptoms, including a 'constant ball' feeling in her stomach, exacerbated by stress and anxiety. She uses Vistaril  for anxiety, which helps without causing drowsiness. She also takes Ambien  for sleep, which she describes as effective, though her sleep quality varies.  She mentions a nail issue on her right thumb, attributed to trauma from acrylic nails. The nail was completely lost and has since regrown, but she notes a discoloration that is 'fading now.' She has stopped using UV lights for nail care, suspecting they may have contributed to the issue.  Her family history includes significant medical conditions: her father recently had a ruptured colon and is a heavy drinker, her grandfather had colon and prostate cancer, and her father has had kidney cancer. She is aware of the need for a colonoscopy at  age 17 due to her family history.  She is currently on meloxicam for pain management but has stopped taking it due to gastrointestinal discomfort, opting for Tylenol  instead. She is also on birth control and takes Topamax  for migraines.     Past Medical History:  Diagnosis Date   Allergy    Anxiety    Back pain    Constipation    Depression    Dyspnea    Gallbladder problem    GERD (gastroesophageal reflux disease)    IBS (irritable bowel syndrome)    Interstitial cystitis    Lactose intolerance    Migraine    Overactive bladder    Palpitations    PCOS (polycystic ovarian syndrome)    Pelvic pain    Pseudotumor cerebri    SUI (stress urinary incontinence, female)    Urinary incontinence     Past Surgical History:  Procedure Laterality Date   ACHILLES TENDON SURGERY Left 07/22/2023   Procedure: Left achilles tendon reconstruction with plantaris autograft and excision of Left Calcaneous Haglund Deformity;  Surgeon: Kit Rush, MD;  Location: [facility];  Service: Orthopedics;  Laterality: Left;   CESAREAN SECTION  2009  &  2011   2011 W/ BILATERAL TUBAL LIGATION   CHOLECYSTECTOMY  2010   CYSTO WITH HYDRODISTENSION  03/01/2012   Procedure: CYSTOSCOPY/HYDRODISTENSION;  Surgeon: Glendia DELENA Elizabeth, MD;  Location: [facility];  Service: Urology;  Laterality: N/A;  INSTILLATION of marcaine and pyridium.   GASTROCNEMIUS RECESSION Left 07/22/2023   Procedure: Gastroc Recession;  Surgeon: Kit Rush, MD;  Location: [facility];  Service: Orthopedics;  Laterality: Left;   PUBOVAGINAL SLING N/A 02/14/2013   Procedure: PUBO-VAGINAL SLING AND HYDRODISTENSION;  Surgeon: Glendia DELENA Elizabeth, MD;  Location: [facility];  Service: Urology;  Laterality: N/A;   TRANSTHORACIC ECHOCARDIOGRAM  06/2009   TUBAL LIGATION      Family History  Problem Relation Age of Onset   Hypertension Mother    Depression Mother    Diabetes Mother     Anxiety disorder Mother    Sleep apnea Mother    Obesity Mother    Hypertension Father    Cancer Father        kidney   Depression Father    Hyperlipidemia Father    Kidney cancer Father    Alcoholism Father    Obesity Father    Hypertension Maternal Grandmother    Cancer Maternal Grandmother        kidney, bone   Hypertension Maternal Grandfather    Cancer Maternal Grandfather        lung, bone   Stroke Maternal Grandfather    Cancer - Colon Maternal Grandfather    Prostate cancer Maternal Grandfather    Hypertension Paternal Grandmother    Hypertension Paternal Grandfather    Stroke Paternal Grandfather    Cancer Maternal Uncle        stomach    Social History   Tobacco Use   Smoking status: Never   Smokeless tobacco: Never  Vaping Use   Vaping status: Never Used  Substance Use Topics   Alcohol use: No   Drug use: No     No Known Allergies  Review of Systems NEGATIVE UNLESS OTHERWISE INDICATED IN HPI      Objective:     BP 112/76 (BP Location: Left Arm, Patient Position: Sitting, Cuff Size: Large)   Pulse (!) 105   Temp (!) 97.5 F (36.4 C) (Temporal)   Ht 5' 4 (1.626 m)   Wt 280 lb (127 kg)   LMP 09/01/2023 (Approximate)   SpO2 95%   BMI 48.06 kg/m   Wt Readings from Last 3 Encounters:  09/08/23 280 lb (127 kg)  07/22/23 286 lb 6 oz (129.9 kg)  07/24/22 284 lb 9.6 oz (129.1 kg)    BP Readings from Last 3 Encounters:  09/08/23 112/76  07/22/23 121/75  07/11/23 126/86     Physical Exam Vitals and nursing note reviewed.  Constitutional:      Appearance: Normal appearance. She is obese. She is not toxic-appearing.  HENT:     Head: Normocephalic and atraumatic.     Right Ear: Tympanic membrane, ear canal and external ear normal.     Left Ear: Tympanic membrane, ear canal and external ear normal.     Nose: Nose normal.     Mouth/Throat:     Mouth: Mucous membranes are moist.  Eyes:     Extraocular Movements: Extraocular movements  intact.     Conjunctiva/sclera: Conjunctivae normal.     Pupils: Pupils are equal, round, and reactive to light.  Cardiovascular:     Rate and Rhythm: Normal rate and regular rhythm.     Pulses: Normal pulses.     Heart sounds: Normal heart sounds.  Pulmonary:     Effort: Pulmonary effort is normal.  Breath sounds: Normal breath sounds.  Abdominal:     General: Abdomen is flat. Bowel sounds are normal. There is no distension.     Palpations: Abdomen is soft.     Tenderness: There is no abdominal tenderness. There is no right CVA tenderness, left CVA tenderness or guarding.  Musculoskeletal:        General: Normal range of motion.     Cervical back: Normal range of motion and neck supple.  Skin:    General: Skin is warm and dry.     Findings: No lesion or rash.     Comments: See photo R thumbnail   Neurological:     General: No focal deficit present.     Mental Status: She is alert and oriented to person, place, and time.  Psychiatric:        Mood and Affect: Mood normal.        Behavior: Behavior normal.        Thought Content: Thought content normal.        Judgment: Judgment normal.             Yvette Loveless M Nidal Rivet, PA-C

## 2023-09-08 NOTE — Patient Instructions (Signed)
  VISIT SUMMARY: During your visit, we discussed your recovery from Achilles tendon surgery, a nail issue on your right thumb, anxiety and stress-related symptoms, and your current management of migraines and insomnia.  YOUR PLAN: ACHILLES TENDON RUPTURE, STATUS POST SURGICAL REPAIR: You are recovering from surgery for a completely torn Achilles tendon. Recovery is expected to take about a year. -Limit weight-bearing activity to one hour per day. -Follow up with your orthopedic surgeon on September 3rd to discuss the possibility of returning to light duty work. -Continue using Tylenol  for pain management.  TRAUMATIC NAIL LOSS AND MELANONYCHIA, RIGHT THUMB: You lost your right thumb nail due to trauma, and it has regrown with some discoloration. We are considering whether this is due to trauma or another cause. -We will send an urgent message to dermatology with a photo of your nail for evaluation. -Await dermatology's recommendation for potential biopsy or observation.  GENERALIZED ANXIETY, STRESS-RELATED EXACERBATION: You are experiencing increased anxiety and stress-related gastrointestinal symptoms. -Continue using Vistaril  as needed for anxiety management.  MIGRAINE, STABLE ON TOPAMAX  THERAPY: Your migraines are well-managed with Topamax . -Continue your current Topamax  therapy.  INSOMNIA, STABLE ON AMBIEN  THERAPY: Your insomnia is well-managed with Ambien . -Continue your current Ambien  therapy.                      Contains text generated by [employer].                                 Contains text generated by [employer].

## 2023-09-09 LAB — HEPATITIS C ANTIBODY: Hepatitis C Ab: NONREACTIVE

## 2023-09-10 ENCOUNTER — Ambulatory Visit: Payer: Self-pay | Admitting: Physician Assistant

## 2023-09-15 ENCOUNTER — Other Ambulatory Visit: Payer: Self-pay | Admitting: Physician Assistant

## 2023-09-15 ENCOUNTER — Other Ambulatory Visit

## 2023-09-21 ENCOUNTER — Other Ambulatory Visit

## 2023-09-22 ENCOUNTER — Other Ambulatory Visit

## 2023-09-22 ENCOUNTER — Ambulatory Visit
Admission: RE | Admit: 2023-09-22 | Discharge: 2023-09-22 | Disposition: A | Discharge status: Home / Self Care | Source: Ambulatory Visit | Attending: Physician Assistant | Admitting: Physician Assistant

## 2023-09-22 DIAGNOSIS — Z1231 Encounter for screening mammogram for malignant neoplasm of breast: Secondary | ICD-10-CM

## 2023-09-28 ENCOUNTER — Ambulatory Visit: Payer: Self-pay | Admitting: Physician Assistant

## 2023-09-29 ENCOUNTER — Other Ambulatory Visit: Payer: Self-pay | Admitting: Physician Assistant

## 2023-09-29 DIAGNOSIS — R928 Other abnormal and inconclusive findings on diagnostic imaging of breast: Secondary | ICD-10-CM

## 2023-10-05 ENCOUNTER — Ambulatory Visit: Admitting: Dermatology

## 2023-10-05 ENCOUNTER — Encounter: Payer: Self-pay | Admitting: Dermatology

## 2023-10-05 VITALS — BP 142/80 | HR 107

## 2023-10-05 DIAGNOSIS — L608 Other nail disorders: Secondary | ICD-10-CM | POA: Diagnosis not present

## 2023-10-05 NOTE — Progress Notes (Signed)
   New Patient Visit   Subjective  [name] is a 38 y.o. female who presents for the following: Discoloring of nail  Patient states that the color changed almost a year ago. She would use acrylic nails that would peel her real nail off when she removed. She last had an acrylic nail a month ago.   The following portions of the chart were reviewed this encounter and updated as appropriate: medications, allergies, medical history  Review of Systems:  No other skin or systemic complaints except as noted in HPI or Assessment and Plan.  Objective  Well appearing patient in no apparent distress; mood and affect are within normal limits.  A focused examination was performed of the following areas: fingers  Relevant exam findings are noted in the Assessment and Plan.    Assessment & Plan   Longitudinal Melanonychia- Right thumbnail- 2 mm dark brown linear pigment - Discussed that lesions appears thin; will measure, photo, and monitor - Re-check in 2-3 months - Discussed that if lesions changes or gets wider, will consider nail biopsy at that time, including nail avulsion and nail matrix biopsy, patient understands. - Patient was counseled regarding evaluation of longitudinal melanonychia of the fingernail. Discussed that while many cases are benign, persistent or irregularly pigmented bands--especially those that are dark, widened, or changing--warrant further investigation to rule out subungual melanoma. Recommended nail matrix and nail bed biopsy to obtain a definitive diagnosis. Explained that the procedure involves performing a local digital nerve block to numb the finger, followed by partial or complete nail plate avulsion to expose the pigmented area. A small tissue sample will then be taken from the nail matrix and/or bed where the pigmentation originates. Reviewed risks including pain, infection, bleeding, potential nail plate deformity, and scarring. Emphasized the importance of  biopsy for accurate diagnosis and appropriate management. Patient verbalized understanding and agreed to proceed with the planned evaluation.   Return in about 3 months (around 01/04/2024) for follow up with Dr. Fontella Shan .  LILLETTE Rollene Gobble, RN, am acting as scribe for RUFUS CHRISTELLA HOLY, MD .   Documentation: I have reviewed the above documentation for accuracy and completeness, and I agree with the above.  RUFUS CHRISTELLA HOLY, MD

## 2023-10-07 ENCOUNTER — Ambulatory Visit
Admission: RE | Admit: 2023-10-07 | Discharge: 2023-10-07 | Disposition: A | Discharge status: Home / Self Care | Source: Ambulatory Visit | Attending: Physician Assistant | Admitting: Physician Assistant

## 2023-10-07 ENCOUNTER — Ambulatory Visit: Payer: Self-pay | Admitting: Physician Assistant

## 2023-10-07 ENCOUNTER — Ambulatory Visit
Admission: RE | Admit: 2023-10-07 | Discharge: 2023-10-07 | Disposition: A | Discharge status: Home / Self Care | Source: Ambulatory Visit | Attending: Physician Assistant

## 2023-10-07 DIAGNOSIS — R928 Other abnormal and inconclusive findings on diagnostic imaging of breast: Secondary | ICD-10-CM

## 2023-10-08 ENCOUNTER — Other Ambulatory Visit: Payer: Self-pay | Admitting: Physician Assistant

## 2023-10-08 DIAGNOSIS — G43109 Migraine with aura, not intractable, without status migrainosus: Secondary | ICD-10-CM

## 2023-11-03 ENCOUNTER — Encounter (HOSPITAL_COMMUNITY): Payer: Self-pay | Admitting: Psychiatry

## 2023-11-03 ENCOUNTER — Telehealth (HOSPITAL_COMMUNITY): Admitting: Psychiatry

## 2023-11-03 VITALS — Wt 280.0 lb

## 2023-11-03 DIAGNOSIS — G4709 Other insomnia: Secondary | ICD-10-CM

## 2023-11-03 DIAGNOSIS — F32A Depression, unspecified: Secondary | ICD-10-CM

## 2023-11-03 MED ORDER — ZOLPIDEM TARTRATE ER 6.25 MG PO TBCR: 6.2500 mg | EXTENDED_RELEASE_TABLET | Freq: Every day | ORAL | 0 refills | Status: DC

## 2023-11-03 MED ORDER — BUPROPION HCL ER (XL) 300 MG PO TB24: 300.0000 mg | ORAL_TABLET | Freq: Every day | ORAL | 2 refills | Status: DC

## 2023-11-03 NOTE — Progress Notes (Signed)
 [facility] MD Virtual Progress Note   Patient Location: Work Provider Location: Home Office  I connect with patient by video and verified that I am speaking with correct person by using two identifiers. I discussed the limitations of evaluation and management by telemedicine and the availability of in person appointments. I also discussed with the patient that there may be a patient responsible charge related to this service. The patient expressed understanding and agreed to proceed.  [name] [id number] 38 y.o.  11/03/2023 11:19 AM  History of Present Illness:  Patient is evaluated by video session.  She is at work.  She is doing better since the last visit has started working 4 weeks ago.  She started physical therapy but is still soreness in her left ankle.  She reported sleep is on and off and despite taking melatonin and Ambien  5 mg some nights cannot sleep very well.  She had a sleep study which was negative.  She is not sure if she is tired or husband snoring but she feel racing thoughts and restless at night.  In the past she had tried trazodone and PCP given hydroxyzine  but they did not help.  She overall feels depression and anxiety is under control.  She denies any crying spells, feeling of hopelessness or worthlessness.  Patient told her 48 year old son broke her finger more than a month ago and he was not able to play the football but now he started playing last week and she is happy about it.  Patient denies any mania, psychosis, hallucination.  She denies any suicidal thoughts.  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  She is taking Wellbutrin .  Recently had a blood work and labs are stable other than hemoglobin A1c slightly increased to 5.9.  She had lost weight and now watching her calorie intake closely.  Past Psychiatric History: H/O anxiety and depression. Tried Wellbutrin  for weight loss, amitriptyline  for  migraine, hydroxyzine , trazodone and Belsomra  for insomnia.  Took Effexor  for long time until it stopped working.   Past Medical History:  Diagnosis Date   Allergy    Anxiety    Back pain    Constipation    Depression    Dyspnea    Gallbladder problem    GERD (gastroesophageal reflux disease)    IBS (irritable bowel syndrome)    Interstitial cystitis    Lactose intolerance    Migraine    Overactive bladder    Palpitations    PCOS (polycystic ovarian syndrome)    Pelvic pain    Pseudotumor cerebri    SUI (stress urinary incontinence, female)    Urinary incontinence     Outpatient Encounter Medications as of 11/03/2023  Medication Sig   buPROPion  (WELLBUTRIN  XL) 300 MG 24 hr tablet Take 1 tablet (300 mg total) by mouth daily.   cetirizine (ZYRTEC) 10 MG tablet Take 10 mg by mouth daily.   Cholecalciferol (VITAMIN D ) 125 MCG (5000 UT) CAPS Take 1 capsule by mouth daily.   fluticasone  (FLONASE ) 50 MCG/ACT nasal spray Place 2 sprays into both nostrils daily.   hydrOXYzine  (ATARAX ) 25 MG tablet TAKE 1 TABLET 30-60 MINUTES PRIOR TO BEDTIME AS NEEDED FOR INSOMNIA, ANXIETY. MAY INCREASE TO TWO TABLETS.   Melatonin 10 MG TABS Take 10 mg by mouth at bedtime.    meloxicam (MOBIC) 15 MG tablet Take 15 mg by mouth daily.   Multiple Vitamin (MULTIVITAMIN) tablet Take 1 tablet by  mouth daily.   spironolactone (ALDACTONE) 100 MG tablet Take 100 mg by mouth daily.   SPRINTEC 28 0.25-35 MG-MCG tablet Take 1 tablet by mouth daily.   topiramate  (TOPAMAX ) 50 MG tablet TAKE 1 TABLET BY MOUTH EVERYDAY AT BEDTIME   zolpidem  (AMBIEN ) 5 MG tablet Take 1 tablet (5 mg total) by mouth at bedtime.   No facility-administered encounter medications on file as of 11/03/2023.    Recent Results (from the past 2160 hours)  CBC with Differential/Platelet     Status: None   Collection Time: 09/08/23 11:04 AM  Result Value Ref Range   WBC 7.7 4.0 - 10.5 K/uL   RBC 4.40 3.87 - 5.11 Mil/uL   Hemoglobin 14.2 12.0  - 15.0 g/dL   HCT 57.5 63.9 - 53.9 %   MCV 96.3 78.0 - 100.0 fl   MCHC 33.4 30.0 - 36.0 g/dL   RDW 85.9 88.4 - 84.4 %   Platelets 392.0 150.0 - 400.0 K/uL   Neutrophils Relative % 58.4 43.0 - 77.0 %   Lymphocytes Relative 34.2 12.0 - 46.0 %   Monocytes Relative 5.3 3.0 - 12.0 %   Eosinophils Relative 1.4 0.0 - 5.0 %   Basophils Relative 0.7 0.0 - 3.0 %   Neutro Abs 4.5 1.4 - 7.7 K/uL   Lymphs Abs 2.6 0.7 - 4.0 K/uL   Monocytes Absolute 0.4 0.1 - 1.0 K/uL   Eosinophils Absolute 0.1 0.0 - 0.7 K/uL   Basophils Absolute 0.1 0.0 - 0.1 K/uL  Comprehensive metabolic panel with GFR     Status: Abnormal   Collection Time: 09/08/23 11:04 AM  Result Value Ref Range   Sodium 137 135 - 145 mEq/L   Potassium 3.6 3.5 - 5.1 mEq/L   Chloride 106 96 - 112 mEq/L   CO2 21 19 - 32 mEq/L   Glucose, Bld 102 (H) 70 - 99 mg/dL   BUN 17 6 - 23 mg/dL   Creatinine, Ser 9.15 0.40 - 1.20 mg/dL   Total Bilirubin 0.3 0.2 - 1.2 mg/dL   Alkaline Phosphatase 55 39 - 117 U/L   AST 18 0 - 37 U/L   ALT 26 0 - 35 U/L   Total Protein 7.2 6.0 - 8.3 g/dL   Albumin 4.1 3.5 - 5.2 g/dL   GFR 12.96 >39.99 mL/min    Comment: Calculated using the CKD-EPI Creatinine Equation (2021)   Calcium  9.1 8.4 - 10.5 mg/dL  Hemoglobin J8r     Status: None   Collection Time: 09/08/23 11:04 AM  Result Value Ref Range   Hgb A1c MFr Bld 5.9 4.6 - 6.5 %    Comment: Glycemic Control Guidelines for People with Diabetes:Non Diabetic:  <6%Goal of Therapy: <7%Additional Action Suggested:  >8%   Lipid panel     Status: None   Collection Time: 09/08/23 11:04 AM  Result Value Ref Range   Cholesterol 131 0 - 200 mg/dL    Comment: ATP III Classification       Desirable:  < 200 mg/dL               Borderline High:  200 - 239 mg/dL          High:  > = 759 mg/dL   Triglycerides 874.9 0.0 - 149.0 mg/dL    Comment: Normal:  <849 mg/dLBorderline High:  150 - 199 mg/dL   HDL 49.19 >60.99 mg/dL   VLDL 74.9 0.0 - 59.9 mg/dL   LDL Cholesterol 55 0 -  99  mg/dL   Total CHOL/HDL Ratio 3     Comment:                Men          Women1/2 Average Risk     3.4          3.3Average Risk          5.0          4.42X Average Risk          9.6          7.13X Average Risk          15.0          11.0                       NonHDL 79.83     Comment: NOTE:  Non-HDL goal should be 30 mg/dL higher than patient's LDL goal (i.e. LDL goal of < 70 mg/dL, would have non-HDL goal of < 100 mg/dL)  TSH     Status: None   Collection Time: 09/08/23 11:04 AM  Result Value Ref Range   TSH 3.79 0.35 - 5.50 uIU/mL  Hepatitis C Antibody     Status: None   Collection Time: 09/08/23 11:04 AM  Result Value Ref Range   Hepatitis C Ab NON-REACTIVE NON-REACTIVE    Comment: . HCV antibody was non-reactive. There is no laboratory  evidence of HCV infection. . In most cases, no further action is required. However, if recent HCV exposure is suspected, a test for HCV RNA (test code 64354) is suggested. . For additional information please refer to http://education.[facility].com/faq/FAQ22v1 (This link is being provided for informational/ educational purposes only.) .   Vitamin B12     Status: None   Collection Time: 09/08/23 11:04 AM  Result Value Ref Range   Vitamin B-12 279 211 - 911 pg/mL  Vitamin D  (25 hydroxy)     Status: None   Collection Time: 09/08/23 11:04 AM  Result Value Ref Range   VITD 65.55 30.00 - 100.00 ng/mL     Psychiatric Specialty Exam: Physical Exam  Review of Systems  Weight 280 lb (127 kg).There is no height or weight on file to calculate BMI.  General Appearance: Casual  Eye Contact:  Good  Speech:  Clear and Coherent  Volume:  Normal  Mood:  Euthymic  Affect:  Appropriate  Thought Process:  Goal Directed  Orientation:  Full (Time, Place, and Person)  Thought Content:  Logical  Suicidal Thoughts:  No  Homicidal Thoughts:  No  Memory:  Immediate;   Good Recent;   Good Remote;   Good  Judgement:  Good  Insight:  Good   Psychomotor Activity:  Normal  Concentration:  Concentration: Good and Attention Span: Good  Recall:  Good  Fund of Knowledge:  Good  Language:  Good  Akathisia:  No  Handed:  Right  AIMS (if indicated):     Assets:  Communication Skills Desire for Improvement Housing Social Support Talents/Skills Transportation  ADL's:  Intact  Cognition:  WNL  Sleep:  Fair despite taking Melatonin and Ambien        09/08/2023   10:41 AM 05/07/2023   11:55 AM 11/12/2022    2:03 PM 11/03/2021   12:39 PM 10/09/2019    2:13 PM  Depression screen PHQ 2/9  Decreased Interest 1 0 3 0 0  Down, Depressed, Hopeless 0 0 0 0 0  PHQ - 2  Score 1 0 3 0 0  Altered sleeping 1 0 3  0  Tired, decreased energy 1 1 2   0  Change in appetite 0 0 0  0  Feeling bad or failure about yourself  0 0 0  0  Trouble concentrating 0 0 1  0  Moving slowly or fidgety/restless 0 0 0  0  Suicidal thoughts 0 0 0  0  PHQ-9 Score 3 1 9   0  Difficult doing work/chores Not difficult at all Not difficult at all Somewhat difficult  Not difficult at all    Assessment/Plan: Anxiety and depression - Plan: buPROPion  (WELLBUTRIN  XL) 300 MG 24 hr tablet, zolpidem  (AMBIEN  CR) 6.25 MG CR tablet  Other insomnia - Plan: zolpidem  (AMBIEN  CR) 6.25 MG CR tablet  Reviewed blood work results, collect information and current medication.  Discussed insomnia.  She tried hydroxyzine  recently from PCP that did not work.  In the past she had tried trazodone that also did not work.  Her sleep study was negative.  Despite taking melatonin and Ambien  she is not sleeping very well.  Recommend to try Ambien  CR 6.25 and discontinue regular Ambien .  Patient agree to try.  We will provide a 30-day prescription of Ambien  CR 6.25 and patient reported sleeping much better then she will call us  for future refills.  Discussed hypnotics dependency, tolerance, withdrawal.  Continue Wellbutrin  XL 300 mg daily.  She is going to start physical therapy and hoping a full  speedy recovery from recent ankle surgery.  Recommend to call back if has any question or any concern.  Follow-up in 3 months.   Follow Up Instructions:     I discussed the assessment and treatment plan with the patient. The patient was provided an opportunity to ask questions and all were answered. The patient agreed with the plan and demonstrated an understanding of the instructions.   The patient was advised to call back or seek an in-person evaluation if the symptoms worsen or if the condition fails to improve as anticipated.    Collaboration of Care: Other provider involved in patient's care AEB notes are available in epic to review  Patient/Guardian was advised Release of Information must be obtained prior to any record release in order to collaborate their care with an outside provider. Patient/Guardian was advised if they have not already done so to contact the registration department to sign all necessary forms in order for us  to release information regarding their care.   Consent: Patient/Guardian gives verbal consent for treatment and assignment of benefits for services provided during this visit. Patient/Guardian expressed understanding and agreed to proceed.     Total encounter time 27 minutes which includes face-to-face time, chart reviewed, care coordination, order entry and documentation during this encounter.   Note: This document was prepared by [employer] voice dictation technology and any errors that results from this process are unintentional.    Leni ONEIDA Client, MD 11/03/2023

## 2023-11-06 ENCOUNTER — Other Ambulatory Visit: Payer: Self-pay | Admitting: Medical Genetics

## 2023-11-06 DIAGNOSIS — Z006 Encounter for examination for normal comparison and control in clinical research program: Secondary | ICD-10-CM

## 2023-11-07 ENCOUNTER — Other Ambulatory Visit (HOSPITAL_COMMUNITY): Payer: Self-pay | Admitting: Psychiatry

## 2023-11-07 DIAGNOSIS — F32A Depression, unspecified: Secondary | ICD-10-CM

## 2023-12-04 LAB — GENECONNECT MOLECULAR SCREEN: Genetic Analysis Overall Interpretation: NEGATIVE

## 2023-12-08 ENCOUNTER — Other Ambulatory Visit (HOSPITAL_COMMUNITY): Payer: Self-pay | Admitting: Psychiatry

## 2023-12-08 DIAGNOSIS — G4709 Other insomnia: Secondary | ICD-10-CM

## 2023-12-08 DIAGNOSIS — F32A Depression, unspecified: Secondary | ICD-10-CM

## 2023-12-10 ENCOUNTER — Encounter (HOSPITAL_COMMUNITY): Payer: Self-pay

## 2023-12-10 ENCOUNTER — Other Ambulatory Visit (HOSPITAL_COMMUNITY): Payer: Self-pay

## 2023-12-10 DIAGNOSIS — F32A Depression, unspecified: Secondary | ICD-10-CM

## 2023-12-10 DIAGNOSIS — G4709 Other insomnia: Secondary | ICD-10-CM

## 2023-12-10 MED ORDER — ZOLPIDEM TARTRATE ER 6.25 MG PO TBCR
6.2500 mg | EXTENDED_RELEASE_TABLET | Freq: Every day | ORAL | 0 refills | Status: AC
Start: 2023-12-10 — End: 2024-01-09

## 2023-12-27 ENCOUNTER — Telehealth

## 2023-12-27 ENCOUNTER — Ambulatory Visit
Admission: EM | Admit: 2023-12-27 | Discharge: 2023-12-27 | Disposition: A | Discharge status: Home / Self Care | Attending: Nurse Practitioner | Admitting: Nurse Practitioner

## 2023-12-27 DIAGNOSIS — R399 Unspecified symptoms and signs involving the genitourinary system: Secondary | ICD-10-CM | POA: Insufficient documentation

## 2023-12-27 DIAGNOSIS — N3010 Interstitial cystitis (chronic) without hematuria: Secondary | ICD-10-CM | POA: Insufficient documentation

## 2023-12-27 LAB — POCT URINE DIPSTICK
Glucose, UA: 100 mg/dL — AB
Nitrite, UA: POSITIVE — AB
POC PROTEIN,UA: >=300 — AB
Spec Grav, UA: 1.02 (ref 1.010–1.025)
Urobilinogen, UA: 1 U/dL
pH, UA: 7 (ref 5.0–8.0)

## 2023-12-27 MED ORDER — NITROFURANTOIN MONOHYD MACRO 100 MG PO CAPS: 100.0000 mg | ORAL_CAPSULE | Freq: Two times a day (BID) | ORAL | 0 refills | Status: AC

## 2023-12-27 NOTE — ED Provider Notes (Signed)
 [facility]    CSN: [id number] Arrival date & time: 12/27/23  1622      History   Chief Complaint No chief complaint on file.   HPI [name] is a 38 y.o. female.   The history is provided by the patient.   Patient presents for complaints of urinary frequency, burning with urination, right sided flank pain, low back pain, and chills.  Patient states symptoms started over the past 3 to 4 days.  She denies fever, headache, ear pain, abdominal pain, nausea, vomiting, hematuria, decreased urine stream, or vaginal symptoms.  Patient reports she does have underlying history of interstitial cystitis.  States that she has undergone a bladder sling.  She states that she had her symptoms controlled at 1 point, but that she has changed some of her habits and symptoms return.  Patient reports that she has seen urology in the past.  States she has been taking over-the-counter Uristat for her symptoms.    Past Medical History:  Diagnosis Date   Allergy    Anxiety    Back pain    Constipation    Depression    Dyspnea    Gallbladder problem    GERD (gastroesophageal reflux disease)    IBS (irritable bowel syndrome)    Interstitial cystitis    Lactose intolerance    Migraine    Overactive bladder    Palpitations    PCOS (polycystic ovarian syndrome)    Pelvic pain    Pseudotumor cerebri    SUI (stress urinary incontinence, female)    Urinary incontinence     Patient Active Problem List   Diagnosis Date Noted   BMI 40.0-44.9, adult (HCC) 09/08/2023   Chronic constipation 09/08/2023   Irritable bowel syndrome 09/08/2023   Chronic interstitial cystitis 09/08/2023   Migraine without aura and responsive to treatment 09/08/2023   Polycystic ovary syndrome 09/08/2023   Polyphagia 09/08/2023   Morbid obesity (HCC) 09/08/2023   Rupture of left Achilles tendon 07/12/2023   Right shoulder tendinitis 11/08/2021   Insect bite of right arm, initial encounter 11/08/2021    Migraine with aura and without status migrainosus, not intractable 03/19/2021   Pre-diabetes 06/12/2020   Vitamin D  deficiency 06/12/2020   Insulin  resistance 04/18/2019   Class 3 severe obesity with serious comorbidity and body mass index (BMI) of 45.0 to 49.9 in adult [facility]) 04/18/2019   Elevated TSH 07/19/2017   Fatigue 07/19/2017   Encounter for annual physical exam 05/05/2016   Generalized anxiety disorder 01/18/2016   Insomnia 01/18/2016   Chronic migraine 01/18/2016    Past Surgical History:  Procedure Laterality Date   ACHILLES TENDON SURGERY Left 07/22/2023   Procedure: Left achilles tendon reconstruction with plantaris autograft and excision of Left Calcaneous Haglund Deformity;  Surgeon: Kit Rush, MD;  Location: [facility];  Service: Orthopedics;  Laterality: Left;   CESAREAN SECTION  2009  &  2011   2011 W/ BILATERAL TUBAL LIGATION   CHOLECYSTECTOMY  2010   CYSTO WITH HYDRODISTENSION  03/01/2012   Procedure: CYSTOSCOPY/HYDRODISTENSION;  Surgeon: Glendia DELENA Elizabeth, MD;  Location: [facility];  Service: Urology;  Laterality: N/A;  INSTILLATION of marcaine  and pyridium .   GASTROCNEMIUS RECESSION Left 07/22/2023   Procedure: Gastroc Recession;  Surgeon: Kit Rush, MD;  Location: [facility];  Service: Orthopedics;  Laterality: Left;   PUBOVAGINAL SLING N/A 02/14/2013   Procedure: PUBO-VAGINAL SLING AND HYDRODISTENSION;  Surgeon: Glendia DELENA Elizabeth, MD;  Location: [facility];  Service: Urology;  Laterality: N/A;   TRANSTHORACIC ECHOCARDIOGRAM  06/2009   TUBAL LIGATION      OB History     Gravida  3   Para      Term      Preterm      AB  1   Living  2      SAB  1   IAB      Ectopic      Multiple      Live Births               Home Medications    Prior to Admission medications   Medication Sig Start Date End Date Taking? Authorizing Provider  nitrofurantoin, macrocrystal-monohydrate,  (MACROBID) 100 MG capsule Take 1 capsule (100 mg total) by mouth 2 (two) times daily. 12/27/23  Yes Leath-Warren, Etta PARAS, NP  buPROPion  (WELLBUTRIN  XL) 300 MG 24 hr tablet Take 1 tablet (300 mg total) by mouth daily. 11/03/23   Arfeen, Leni DASEN, MD  cetirizine (ZYRTEC) 10 MG tablet Take 10 mg by mouth daily.    [provider]  Cholecalciferol (VITAMIN D ) 125 MCG (5000 UT) CAPS Take 1 capsule by mouth daily. 06/11/20   Whitmire, Dawn, FNP  fluticasone  (FLONASE ) 50 MCG/ACT nasal spray Place 2 sprays into both nostrils daily. 04/20/18   Lavell Lye A, FNP  hydrOXYzine  (ATARAX ) 25 MG tablet TAKE 1 TABLET 30-60 MINUTES PRIOR TO BEDTIME AS NEEDED FOR INSOMNIA, ANXIETY. MAY INCREASE TO TWO TABLETS. Patient not taking: Reported on 11/03/2023 09/16/23   Allwardt, Mardy HERO, PA-C  Melatonin 10 MG TABS Take 10 mg by mouth at bedtime.     [provider]  meloxicam (MOBIC) 15 MG tablet Take 15 mg by mouth daily. 09/01/23   [provider]  Multiple Vitamin (MULTIVITAMIN) tablet Take 1 tablet by mouth daily.    [provider]  spironolactone (ALDACTONE) 100 MG tablet Take 100 mg by mouth daily. 12/03/15   [provider]  SPRINTEC 28 0.25-35 MG-MCG tablet Take 1 tablet by mouth daily. 12/01/15   [provider]  topiramate  (TOPAMAX ) 50 MG tablet TAKE 1 TABLET BY MOUTH EVERYDAY AT BEDTIME 10/11/23   Allwardt, Alyssa M, PA-C  zolpidem  (AMBIEN  CR) 6.25 MG CR tablet Take 1 tablet (6.25 mg total) by mouth at bedtime. 12/10/23 01/09/24  Arfeen, Leni DASEN, MD    Family History Family History  Problem Relation Age of Onset   Hypertension Mother    Depression Mother    Diabetes Mother    Anxiety disorder Mother    Sleep apnea Mother    Obesity Mother    Hypertension Father    Cancer Father        kidney   Depression Father    Hyperlipidemia Father    Kidney cancer Father    Alcoholism Father    Obesity Father    Hypertension Maternal Grandmother    Cancer  Maternal Grandmother        kidney, bone   Hypertension Maternal Grandfather    Cancer Maternal Grandfather        lung, bone   Stroke Maternal Grandfather    Cancer - Colon Maternal Grandfather    Prostate cancer Maternal Grandfather    Hypertension Paternal Grandmother    Hypertension Paternal Grandfather    Stroke Paternal Grandfather    Cancer Maternal Uncle        stomach    Social History Social History   Tobacco  Use   Smoking status: Never   Smokeless tobacco: Never  Vaping Use   Vaping status: Never Used  Substance Use Topics   Alcohol use: No   Drug use: No     Allergies   Patient has no known allergies.   Review of Systems Review of Systems Per HPI  Physical Exam Triage Vital Signs ED Triage Vitals  Encounter Vitals Group     BP 12/27/23 1730 134/82     Girls Systolic BP Percentile --      Girls Diastolic BP Percentile --      Boys Systolic BP Percentile --      Boys Diastolic BP Percentile --      Pulse Rate 12/27/23 1730 (!) 105     Resp 12/27/23 1730 16     Temp 12/27/23 1730 97.9 F (36.6 C)     Temp src --      SpO2 12/27/23 1730 93 %     Weight --      Height --      Head Circumference --      Peak Flow --      Pain Score 12/27/23 1729 3     Pain Loc --      Pain Education --      Exclude from Growth Chart --    No data found.  Updated Vital Signs BP 134/82 (BP Location: Right Arm)   Pulse (!) 105   Temp 97.9 F (36.6 C)   Resp 16   LMP 12/22/2023   SpO2 93%   Visual Acuity Right Eye Distance:   Left Eye Distance:   Bilateral Distance:    Right Eye Near:   Left Eye Near:    Bilateral Near:     Physical Exam Vitals and nursing note reviewed.  Constitutional:      General: She is not in acute distress.    Appearance: Normal appearance.  HENT:     Head: Normocephalic.     Mouth/Throat:     Mouth: Mucous membranes are moist.  Eyes:     Extraocular Movements: Extraocular movements intact.     Pupils: Pupils are  equal, round, and reactive to light.  Cardiovascular:     Rate and Rhythm: Normal rate and regular rhythm.     Pulses: Normal pulses.     Heart sounds: Normal heart sounds.  Pulmonary:     Effort: Pulmonary effort is normal. No respiratory distress.     Breath sounds: Normal breath sounds. No stridor. No wheezing, rhonchi or rales.  Abdominal:     General: Bowel sounds are normal.     Palpations: Abdomen is soft.     Tenderness: There is no abdominal tenderness. There is right CVA tenderness. There is no left CVA tenderness.  Musculoskeletal:     Cervical back: Normal range of motion.  Skin:    General: Skin is warm and dry.  Neurological:     General: No focal deficit present.     Mental Status: She is alert and oriented to person, place, and time.  Psychiatric:        Mood and Affect: Mood normal.        Behavior: Behavior normal.      UC Treatments / Results  Labs (all labs ordered are listed, but only abnormal results are displayed) Labs Reviewed  POCT URINE DIPSTICK - Abnormal; Notable for the following components:      Result Value   Color, UA red (*)  Glucose, UA =100 (*)    Bilirubin, UA small (*)    Ketones, POC UA trace (5) (*)    Blood, UA large (*)    POC PROTEIN,UA >=300 (*)    Nitrite, UA Positive (*)    Leukocytes, UA Trace (*)    All other components within normal limits  URINE CULTURE    EKG   Radiology No results found.  Procedures Procedures (including critical care time)  Medications Ordered in UC Medications - No data to display  Initial Impression / Assessment and Plan / UC Course  I have reviewed the triage vital signs and the nursing notes.  Pertinent labs & imaging results that were available during my care of the patient were reviewed by me and considered in my medical decision making (see chart for details).  Patient presents with a 4-day history of urinary symptoms.  Urinalysis is positive for leukocytes, nitrates, protein,  blood, patient has been taking Uristat which most likely has skewed the results of the urinalysis.  Urine culture is pending.  In the interim, we will treat empirically with Macrobid 100 mg.  Supportive care recommendations were provided and discussed with the patient to include fluids, rest, over-the-counter analgesics, developing a toileting schedule, and avoiding caffeine.  Patient was given strict ER follow-up precautions.  Patient was advised if the urine culture result is negative and she is continuing to experience symptoms, recommend follow-up with urology as symptoms are most likely related to her interstitial cystitis.  Patient was in agreement with this plan of care and verbalizes understanding.  All questions were answered.  Patient stable for discharge.  Final Clinical Impressions(s) / UC Diagnoses   Final diagnoses:  UTI symptoms  Interstitial cystitis     Discharge Instructions      Urine culture is pending.  You will be contacted when the results of the urine culture are received.  You will also have access to the results via MyChart.  As discussed, if your urine culture is negative and you are continue to experience symptoms, recommend follow-up with your urologist or with your primary care physician for further evaluation. Take medication as prescribed. Increase your fluid intake.  Recommend drinking at least 8-10 8 ounce glasses of water  daily while symptoms persist. Develop a toileting schedule that will allow you to urinate at least every 2 hours. Avoid caffeine such as tea, soda, or coffee while symptoms persist. If you are sexually active, make sure you are voiding at least 15 to 20 minutes after sexual intercourse. Go to the emergency department if you experience worsening urinary symptoms to include fever, chills, or other concerns. Follow-up as needed.     ED Prescriptions     Medication Sig Dispense Auth. Provider   nitrofurantoin, macrocrystal-monohydrate,  (MACROBID) 100 MG capsule Take 1 capsule (100 mg total) by mouth 2 (two) times daily. 10 capsule Leath-Warren, Etta PARAS, NP      PDMP not reviewed this encounter.   Gilmer Etta PARAS, NP 12/27/23 1755

## 2023-12-27 NOTE — Discharge Instructions (Signed)
 Urine culture is pending.  You will be contacted when the results of the urine culture are received.  You will also have access to the results via MyChart.  As discussed, if your urine culture is negative and you are continue to experience symptoms, recommend follow-up with your urologist or with your primary care physician for further evaluation. Take medication as prescribed. Increase your fluid intake.  Recommend drinking at least 8-10 8 ounce glasses of water  daily while symptoms persist. Develop a toileting schedule that will allow you to urinate at least every 2 hours. Avoid caffeine such as tea, soda, or coffee while symptoms persist. If you are sexually active, make sure you are voiding at least 15 to 20 minutes after sexual intercourse. Go to the emergency department if you experience worsening urinary symptoms to include fever, chills, or other concerns. Follow-up as needed.

## 2023-12-27 NOTE — ED Triage Notes (Signed)
 Pt present with c/o possible UTI. Reports chills on Thursday and Friday she experienced urinary frequency.

## 2023-12-30 ENCOUNTER — Ambulatory Visit (HOSPITAL_COMMUNITY): Payer: Self-pay

## 2023-12-30 LAB — URINE CULTURE: Culture: >=100000 — AB

## 2024-01-03 ENCOUNTER — Ambulatory Visit: Admitting: Dermatology

## 2024-01-06 ENCOUNTER — Other Ambulatory Visit (HOSPITAL_COMMUNITY): Payer: Self-pay | Admitting: Psychiatry

## 2024-01-06 DIAGNOSIS — G4709 Other insomnia: Secondary | ICD-10-CM

## 2024-01-06 DIAGNOSIS — F32A Depression, unspecified: Secondary | ICD-10-CM

## 2024-01-10 ENCOUNTER — Encounter (HOSPITAL_COMMUNITY): Payer: Self-pay

## 2024-01-10 DIAGNOSIS — F419 Anxiety disorder, unspecified: Secondary | ICD-10-CM

## 2024-01-10 DIAGNOSIS — G4709 Other insomnia: Secondary | ICD-10-CM

## 2024-01-10 MED ORDER — ZOLPIDEM TARTRATE ER 6.25 MG PO TBCR: 6.2500 mg | EXTENDED_RELEASE_TABLET | Freq: Every day | ORAL | 0 refills | Status: DC

## 2024-01-10 NOTE — Telephone Encounter (Signed)
 A new prescription of Ambien  CR 6.25 mg sent to her pharmacy.  Please inform the patient.

## 2024-01-25 ENCOUNTER — Ambulatory Visit: Admitting: Dermatology

## 2024-02-03 ENCOUNTER — Ambulatory Visit: Admitting: Dermatology

## 2024-02-04 ENCOUNTER — Telehealth: Admitting: Family Medicine

## 2024-02-04 DIAGNOSIS — J4540 Moderate persistent asthma, uncomplicated: Secondary | ICD-10-CM

## 2024-02-04 MED ORDER — PREDNISONE 20 MG PO TABS: 20.0000 mg | ORAL_TABLET | Freq: Two times a day (BID) | ORAL | 0 refills | Status: AC

## 2024-02-04 MED ORDER — AZITHROMYCIN 250 MG PO TABS: ORAL_TABLET | ORAL | 0 refills | Status: AC

## 2024-02-04 MED ORDER — BENZONATATE 200 MG PO CAPS: 200.0000 mg | ORAL_CAPSULE | Freq: Two times a day (BID) | ORAL | 0 refills | Status: AC | PRN

## 2024-02-04 NOTE — Progress Notes (Signed)
 We are sorry that you are not feeling well.  Here is how we plan to help!  Based on your presentation I believe you most likely have A cough due to bacteria.  When patients have a fever and a productive cough with a change in color or increased sputum production, we are concerned about bacterial bronchitis.  If left untreated it can progress to pneumonia.  If your symptoms do not improve with your treatment plan it is important that you contact your provider.   I have prescribed Azithromyin 250 mg: two tablets now and then one tablet daily for 4 additonal days    In addition you may use A prescription cough medication called Tessalon  Perles 100mg . You may take 1-2 capsules every 8 hours as needed for your cough.  Prednisone  20 mg bid for 5 days.   From your responses in the eVisit questionnaire you describe inflammation in the upper respiratory tract which is causing a significant cough.  This is commonly called Bronchitis and has four common causes:   Allergies Viral Infections Acid Reflux Bacterial Infection Allergies, viruses and acid reflux are treated by controlling symptoms or eliminating the cause. An example might be a cough caused by taking certain blood pressure medications. You stop the cough by changing the medication. Another example might be a cough caused by acid reflux. Controlling the reflux helps control the cough.  USE OF BRONCHODILATOR (RESCUE) INHALERS: There is a risk from using your bronchodilator too frequently.  The risk is that over-reliance on a medication which only relaxes the muscles surrounding the breathing tubes can reduce the effectiveness of medications prescribed to reduce swelling and congestion of the tubes themselves.  Although you feel brief relief from the bronchodilator inhaler, your asthma may actually be worsening with the tubes becoming more swollen and filled with mucus.  This can delay other crucial treatments, such as oral steroid medications. If you  need to use a bronchodilator inhaler daily, several times per day, you should discuss this with your provider.  There are probably better treatments that could be used to keep your asthma under control.     HOME CARE Only take medications as instructed by your medical team. Complete the entire course of an antibiotic. Drink plenty of fluids and get plenty of rest. Avoid close contacts especially the very young and the elderly Cover your mouth if you cough or cough into your sleeve. Always remember to wash your hands A steam or ultrasonic humidifier can help congestion.   GET HELP RIGHT AWAY IF: You develop worsening fever. You become short of breath You cough up blood. Your symptoms persist after you have completed your treatment plan MAKE SURE YOU  Understand these instructions. Will watch your condition. Will get help right away if you are not doing well or get worse.  Your e-visit answers were reviewed by a board certified advanced clinical practitioner to complete your personal care plan.  Depending on the condition, your plan could have included both over the counter or prescription medications. If there is a problem please reply  once you have received a response from your provider. Your safety is important to us .  If you have drug allergies check your prescription carefully.    You can use MyChart to ask questions about todays visit, request a non-urgent call back, or ask for a work or school excuse for 24 hours related to this e-Visit. If it has been greater than 24 hours you will need to follow up  with your provider, or enter a new e-Visit to address those concerns. You will get an e-mail in the next two days asking about your experience.  I hope that your e-visit has been valuable and will speed your recovery. Thank you for using e-visits.   I have spent 5 minutes in review of e-visit questionnaire, review and updating patient chart, medical decision making and response to  patient.   Chyrl Elwell, FNP

## 2024-02-07 ENCOUNTER — Telehealth (HOSPITAL_COMMUNITY): Admitting: Psychiatry

## 2024-02-07 ENCOUNTER — Other Ambulatory Visit (HOSPITAL_COMMUNITY): Payer: Self-pay | Admitting: Psychiatry

## 2024-02-07 ENCOUNTER — Encounter (HOSPITAL_COMMUNITY): Payer: Self-pay | Admitting: Psychiatry

## 2024-02-07 VITALS — Wt 280.0 lb

## 2024-02-07 DIAGNOSIS — F32A Depression, unspecified: Secondary | ICD-10-CM

## 2024-02-07 DIAGNOSIS — F419 Anxiety disorder, unspecified: Secondary | ICD-10-CM

## 2024-02-07 DIAGNOSIS — G4709 Other insomnia: Secondary | ICD-10-CM

## 2024-02-07 MED ORDER — ZOLPIDEM TARTRATE ER 6.25 MG PO TBCR: 6.2500 mg | EXTENDED_RELEASE_TABLET | Freq: Every day | ORAL | 2 refills | Status: AC

## 2024-02-07 MED ORDER — BUPROPION HCL ER (XL) 300 MG PO TB24: 300.0000 mg | ORAL_TABLET | Freq: Every day | ORAL | 2 refills | Status: AC

## 2024-02-07 MED ORDER — MELATONIN 10 MG PO TABS: 10.0000 mg | ORAL_TABLET | Freq: Every day | ORAL | Status: AC

## 2024-02-07 NOTE — Progress Notes (Signed)
 " [facility] MD Virtual Progress Note   Patient Location: Work Provider Location: Home Office  I connect with patient by video and verified that I am speaking with correct person by using two identifiers. I discussed the limitations of evaluation and management by telemedicine and the availability of in person appointments. I also discussed with the patient that there may be a patient responsible charge related to this service. The patient expressed understanding and agreed to proceed.  [name] [id number] 39 y.o.  02/07/2024 2:07 PM  History of Present Illness:  Patient is evaluated by video session.  She reported for past few days been sick.  She had negative flu and COVID but have cough and feeling very tired.  She took few days off but due to short staff she has to work.  Today she mention very tired but otherwise medicine working.  She is taking Ambien  and sometimes she takes melatonin when Ambien  alone cannot help sleep.  She denies any panic attack, crying spells or any feeling of hopelessness or worthlessness.  She is happy Christmas is over.  Her son got what he wanted on Christmas.  She is a 39 year old son.  Patient lives with her husband.  Patient denies any agitation, anger, suicidal thoughts.  She likes to keep the current medication.  Past Psychiatric History: H/O anxiety and depression. Tried Wellbutrin  for weight loss, amitriptyline  for migraine, hydroxyzine , trazodone and Belsomra  for insomnia.  Took Effexor  for Kramer time until it stopped working.   Past Medical History:  Diagnosis Date   Allergy    Anxiety    Back pain    Constipation    Depression    Dyspnea    Gallbladder problem    GERD (gastroesophageal reflux disease)    IBS (irritable bowel syndrome)    Interstitial cystitis    Lactose intolerance    Migraine    Overactive bladder    Palpitations    PCOS (polycystic ovarian syndrome)    Pelvic pain    Pseudotumor cerebri    SUI  (stress urinary incontinence, female)    Urinary incontinence     Outpatient Encounter Medications as of 02/07/2024  Medication Sig   azithromycin  (ZITHROMAX ) 250 MG tablet Take 2 tablets on day 1, then 1 tablet daily on days 2 through 5   benzonatate  (TESSALON ) 200 MG capsule Take 1 capsule (200 mg total) by mouth 2 (two) times daily as needed for cough.   buPROPion  (WELLBUTRIN  XL) 300 MG 24 hr tablet Take 1 tablet (300 mg total) by mouth daily.   cetirizine (ZYRTEC) 10 MG tablet Take 10 mg by mouth daily.   Cholecalciferol (VITAMIN D ) 125 MCG (5000 UT) CAPS Take 1 capsule by mouth daily.   fluticasone  (FLONASE ) 50 MCG/ACT nasal spray Place 2 sprays into both nostrils daily.   hydrOXYzine  (ATARAX ) 25 MG tablet TAKE 1 TABLET 30-60 MINUTES PRIOR TO BEDTIME AS NEEDED FOR INSOMNIA, ANXIETY. MAY INCREASE TO TWO TABLETS. (Patient not taking: Reported on 11/03/2023)   Melatonin 10 MG TABS Take 10 mg by mouth at bedtime.    meloxicam (MOBIC) 15 MG tablet Take 15 mg by mouth daily.   Multiple Vitamin (MULTIVITAMIN) tablet Take 1 tablet by mouth daily.   nitrofurantoin , macrocrystal-monohydrate, (MACROBID ) 100 MG capsule Take 1 capsule (100 mg total) by mouth 2 (two) times daily.   predniSONE  (DELTASONE ) 20 MG tablet Take 1 tablet (20 mg total) by mouth 2 (two) times daily with a meal for 5 days.  spironolactone (ALDACTONE) 100 MG tablet Take 100 mg by mouth daily.   SPRINTEC 28 0.25-35 MG-MCG tablet Take 1 tablet by mouth daily.   topiramate  (TOPAMAX ) 50 MG tablet TAKE 1 TABLET BY MOUTH EVERYDAY AT BEDTIME   zolpidem  (AMBIEN  CR) 6.25 MG CR tablet Take 1 tablet (6.25 mg total) by mouth at bedtime.   No facility-administered encounter medications on file as of 02/07/2024.    Recent Results (from the past 2160 hours)  [facility] Molecular Screen     Status: None   Collection Time: 11/20/23  5:28 PM  Result Value Ref Range   Genetic Analysis Overall Interpretation Negative    Genetic Disease  Assessed      This is a screening test and does not detect all pathogenic or likely pathogenic variant(s) in the tested genes; diagnostic testing is recommended for individuals with a personal or family history of heart disease or hereditary cancer. [facility] Tier One  Population Screen is a screening test that analyzes 11 genes related to hereditary breast and ovarian cancer (HBOC) syndrome, Lynch syndrome, and familial hypercholesterolemia. This test only reports clinically significant pathogenic and likely  pathogenic variants but does not report variants of uncertain significance (VUS). In addition, analysis of the PMS2 gene excludes exons 11-15, which overlap with a known pseudogene (PMS2CL).    Genetic Analysis Report      No pathogenic or likely pathogenic variants were detected in the genes analyzed by this test.Genetic test results should be interpreted in the context of an individual's personal medical and family history. Alteration to medical management is NOT  recommended based solely on this result. Clinical correlation is advised.Additional Considerations- This is a screening test; individuals may still carry pathogenic or likely pathogenic variant(s) in the tested genes that are not detected by this test.-  For individuals at risk for these or other related conditions based on factors including personal or family history, diagnostic testing is recommended.- The absence of pathogenic or likely pathogenic variant(s) in the analyzed genes, while reassuring,  does not eliminate the possibility of a hereditary condition; there are other variants and genes associated with heart disease and hereditary cancer that are not included in this test.    Genes Tested See Notes     Comment: APOB, BRCA1, BRCA2, EPCAM, LDLR, LDLRAP1, PCSK9, PMS2, MLH1, MSH2, MSH6   Disclaimer See Notes     Comment: This test was developed and validated by [facility] This test has not been cleared or approved by the [place]  [place] Food and Drug Administration (FDA). The [facility] laboratory is accredited by the College of American Pathologists (CAP) and certified under  the Clinical Laboratory Improvement Amendments (CLIA #: [id number]) to perform high-complexity clinical tests. This test is used for clinical purposes. It should not be regarded as investigational use only or for research use only.    Sequencing Location See Notes     Comment: Sequencing done at [facility] [address], [address], [place] [address] (CLIA# [id number])   Interpretation Methods and Limitations See Notes     Comment: Extracted DNA is enriched for targeted regions and then sequenced using the [facility] Exome+ (R) assay on an Illumina DNA sequencing system. Data is then aligned to a modified version of GRCh38 and all genes are analyzed using the MANE transcript and MANE  Plus Clinical transcript, when available. Small variant calling is completed using a customized version of [employer]'s DNAseq software, augmented by a proprietary small variant caller for difficult variants.  Copy number variants (CNVs) are then called  using a proprietary bioinformatics pipeline based on depth analysis with a comparison to similarly sequenced samples. Analysis of the PMS2 gene is limited to exons 1-10. Both the MSH2 Boland inversion (exons 1-7) and the BRCA2 Alu insertion are detected  by identifying discordant read-pairs spanning the breakpoints. The interpretation and reporting of variants in APOB, PCSK9, and LDLR is specific to familial hypercholesterolemia; variants associated with hypobetalipoproteinemia are not i ncluded.  Interpretation is based upon guidelines published by the [employer] and [employer]), the Association for Molecular Pathology (AMP) or their modification by [employer] when available and/or review  of previous clinical assertions available in the [employer].  Interpretation is limited to the transcripts indicated on the report and +/- 10 bp into intronic regions, except as noted below. [facility] variant classifications include pathogenic, likely  pathogenic, variant of uncertain significance (VUS), likely benign, and benign. Only variants classified as pathogenic and likely pathogenic are included in the report. All reported variants are confirmed through secondary manual inspection of DNA  sequence data or orthogonal testing. Risk estimations and management guidelines included in this report are based on analysis of primary literature and recommendations of applicable professional societies, and should be regarded  as approximations.Based  on validation studies, this assay delivers > 99% sensitivity and specificity for single nucleotide variants and insertions and deletions (indels) up to 20 bp. Larger indels and complex variants are also reported but sensitivity may be reduced. Based on  validation studies, this assay delivers > 99% sensitivity to multi-exon CNVs and > 90% sensitivity to single-exon CNVs. This test may not detect variants in challenging regions (such as short tandem repeats, homopolymer runs, and segment duplications),  sub-exonic CNVs, chromosomal aneuploidy, or variants in the presence of mosaicism. Phasing will be attempted and reported, when possible. Structural rearrangements such as inversions, translocations, complex rearrangements, and gene conversions are not  tested in this assay unless explicitly indicated. Additionally, deep intronic, promoter, and enhancer regions may not be covered. It is important to note that this is a screening test and cannot detect all di sease-causing variants. A negative result does  not guarantee the absence of a rare, undetectable variant in the genes analyzed; consider using a diagnostic test if there is significant personal and/or family history of one of the conditions analyzed by this test. Any potential  incidental findings  outside of these genes and conditions will not be identified, nor reported. The results of a genetic test may be influenced by various factors, including bone marrow transplantation, blood transfusions, or in rare cases, hematolymphoid neoplasms.Gene  Specific Notes:APOB: analysis is limited to c.10580G>A and c.10579C>T; BRCA1: sequencing analysis extends to CDS +/-20 bp; BRCA2: analysis includes detection of [id number] and sequencing analysis extends to CDS +/-20 bp. EPCAM: analysis is limited  to CNV of exons 8-9; LDLR: analysis includes CNV of the promoter; MLH1: analysis includes CNV of the promoter; MSH2: analysis includes detection of the Boland inversion (inversion of exons 1 -7) and detection of c.942+3A>T, PMS2: analysis is limited to  exons 1-10.Donnice JINNY Kemp, PhD, [email or web address]   POCT URINE DIPSTICK     Status: Abnormal   Collection Time: 12/27/23  5:41 PM  Result Value Ref Range   Color, UA red (A) yellow   Clarity, UA clear clear   Glucose, UA =100 (A) negative mg/dL   Bilirubin, UA small (A) negative   Ketones, POC UA trace (5) (A) negative  mg/dL   Spec Grav, UA 8.979 8.989 - 1.025   Blood, UA large (A) negative   pH, UA 7.0 5.0 - 8.0   POC PROTEIN,UA >=300 (A) negative, trace   Urobilinogen, UA 1.0 0.2 or 1.0 E.U./dL   Nitrite, UA Positive (A) Negative   Leukocytes, UA Trace (A) Negative  Urine Culture     Status: Abnormal   Collection Time: 12/27/23  5:55 PM   Specimen: Urine, Clean Catch  Result Value Ref Range   Specimen Description      URINE, CLEAN CATCH Performed at [facility], [address] [place], [place] [address]    Special Requests      NONE Performed at [facility], [address] [place], [place] [address]    Culture >=100,000 COLONIES/mL STAPHYLOCOCCUS SAPROPHYTICUS (A)    Report Status 12/30/2023 FINAL    Organism ID, Bacteria STAPHYLOCOCCUS SAPROPHYTICUS (A)       Susceptibility   Staphylococcus  saprophyticus - MIC*    CIPROFLOXACIN  <=0.5 SENSITIVE Sensitive     GENTAMICIN  <=0.5 SENSITIVE Sensitive     NITROFURANTOIN  <=16 SENSITIVE Sensitive     OXACILLIN >=4 RESISTANT Resistant     TETRACYCLINE >=16 RESISTANT Resistant     VANCOMYCIN  <=0.5 SENSITIVE Sensitive     TRIMETH/SULFA <=10 SENSITIVE Sensitive     RIFAMPIN <=0.5 SENSITIVE Sensitive     Inducible Clindamycin NEGATIVE Sensitive     * >=100,000 COLONIES/mL STAPHYLOCOCCUS SAPROPHYTICUS     Psychiatric Specialty Exam: Physical Exam  Review of Systems  HENT:  Positive for congestion.   Respiratory:  Positive for cough.     Weight 280 lb (127 kg).There is no height or weight on file to calculate BMI.  General Appearance: Casual  Eye Contact:  Good  Speech:  Normal Rate  Volume:  Normal  Mood:  tired  Affect:  Appropriate  Thought Process:  Goal Directed  Orientation:  Full (Time, Place, and Person)  Thought Content:  Logical  Suicidal Thoughts:  No  Homicidal Thoughts:  No  Memory:  Immediate;   Good Recent;   Good Remote;   Good  Judgement:  Good  Insight:  Good  Psychomotor Activity:  Normal  Concentration:  Concentration: Good and Attention Span: Good  Recall:  Good  Fund of Knowledge:  Good  Language:  Good  Akathisia:  No  Handed:  Right  AIMS (if indicated):     Assets:  Communication Skills Desire for Improvement Housing Social Support Talents/Skills Transportation  ADL's:  Intact  Cognition:  WNL  Sleep:  Fair        09/08/2023   10:41 AM 05/07/2023   11:55 AM 11/12/2022    2:03 PM 11/03/2021   12:39 PM 10/09/2019    2:13 PM  Depression screen PHQ 2/9  Decreased Interest 1 0 3 0 0  Down, Depressed, Hopeless 0 0 0 0 0  PHQ - 2 Score 1 0 3 0 0  Altered sleeping 1 0 3  0  Tired, decreased energy 1 1 2   0  Change in appetite 0 0 0  0  Feeling bad or failure about yourself  0 0 0  0  Trouble concentrating 0 0 1  0  Moving slowly or fidgety/restless 0 0 0  0  Suicidal thoughts 0 0 0  0   PHQ-9 Score 3  1  9    0   Difficult doing work/chores Not difficult at all Not difficult at all Somewhat difficult  Not difficult  at all     Data saved with a previous flowsheet row definition    Assessment/Plan: Anxiety and depression - Plan: buPROPion  (WELLBUTRIN  XL) 300 MG 24 hr tablet, zolpidem  (AMBIEN  CR) 6.25 MG CR tablet  Other insomnia - Plan: Melatonin 10 MG TABS, zolpidem  (AMBIEN  CR) 6.25 MG CR tablet  Patient is 39 year old Caucasian married employed female with history of anxiety/depression and insomnia.  Review collateral information from other provider.  Her anxiety depression is stable on Wellbutrin  XL 300 mg daily.  She also takes melatonin but sometime she supplement with melatonin if Ambien  alone does not help.  Discussed hypnotics side effects.  She is not drinking or using any illegal substances.  Recommend to call back if she has any question or any concern.  Follow-up in 3 months.  Follow Up Instructions:     I discussed the assessment and treatment plan with the patient. The patient was provided an opportunity to ask questions and all were answered. The patient agreed with the plan and demonstrated an understanding of the instructions.   The patient was advised to call back or seek an in-person evaluation if the symptoms worsen or if the condition fails to improve as anticipated.    Collaboration of Care: Other provider involved in patient's care AEB notes are available in epic to review  Patient/Guardian was advised Release of Information must be obtained prior to any record release in order to collaborate their care with an outside provider. Patient/Guardian was advised if they have not already done so to contact the registration department to sign all necessary forms in order for us  to release information regarding their care.   Consent: Patient/Guardian gives verbal consent for treatment and assignment of benefits for services provided during this visit.  Patient/Guardian expressed understanding and agreed to proceed.     Total encounter time 17 minutes which includes face-to-face time, chart reviewed, care coordination, order entry and documentation during this encounter.   Note: This document was prepared by [employer] voice dictation technology and any errors that results from this process are unintentional.    Leni ONEIDA Client, MD 02/07/2024   "

## 2024-03-23 ENCOUNTER — Ambulatory Visit: Admitting: Dermatology

## 2024-05-08 ENCOUNTER — Telehealth (HOSPITAL_COMMUNITY): Admitting: Psychiatry

## 2024-09-08 ENCOUNTER — Encounter: Admitting: Physician Assistant
